# Patient Record
Sex: Female | Born: 1935 | Race: White | Hispanic: No | Marital: Married | Smoking: Never smoker
Health system: Southern US, Community
[De-identification: ages and names within clinical notes are randomized; demographics above are authoritative.]

## PROBLEM LIST (undated history)

## (undated) DIAGNOSIS — E785 Hyperlipidemia, unspecified: Secondary | ICD-10-CM

## (undated) DIAGNOSIS — M8580 Other specified disorders of bone density and structure, unspecified site: Secondary | ICD-10-CM

## (undated) DIAGNOSIS — I2510 Atherosclerotic heart disease of native coronary artery without angina pectoris: Secondary | ICD-10-CM

## (undated) DIAGNOSIS — I10 Essential (primary) hypertension: Secondary | ICD-10-CM

## (undated) DIAGNOSIS — G4700 Insomnia, unspecified: Secondary | ICD-10-CM

## (undated) DIAGNOSIS — I341 Nonrheumatic mitral (valve) prolapse: Secondary | ICD-10-CM

## (undated) DIAGNOSIS — J189 Pneumonia, unspecified organism: Secondary | ICD-10-CM

## (undated) DIAGNOSIS — N8110 Cystocele, unspecified: Secondary | ICD-10-CM

## (undated) DIAGNOSIS — M810 Age-related osteoporosis without current pathological fracture: Secondary | ICD-10-CM

## (undated) HISTORY — DX: Age-related osteoporosis without current pathological fracture: M81.0

## (undated) HISTORY — PX: VESICOVAGINAL FISTULA CLOSURE W/ TAH: SUR271

## (undated) HISTORY — DX: Other specified disorders of bone density and structure, unspecified site: M85.80

## (undated) HISTORY — PX: BLADDER SURGERY: SHX569

## (undated) HISTORY — DX: Essential (primary) hypertension: I10

## (undated) HISTORY — DX: Atherosclerotic heart disease of native coronary artery without angina pectoris: I25.10

## (undated) HISTORY — DX: Cystocele, unspecified: N81.10

## (undated) HISTORY — DX: Insomnia, unspecified: G47.00

## (undated) HISTORY — DX: Hyperlipidemia, unspecified: E78.5

## (undated) HISTORY — PX: NASAL FRACTURE SURGERY: SHX718

## (undated) HISTORY — DX: Nonrheumatic mitral (valve) prolapse: I34.1

---

## 1979-06-02 HISTORY — PX: NEPHRECTOMY: SHX65

## 1998-03-29 ENCOUNTER — Ambulatory Visit (HOSPITAL_COMMUNITY): Admission: RE | Admit: 1998-03-29 | Discharge: 1998-03-29 | Payer: Self-pay | Admitting: Urology

## 1999-03-10 ENCOUNTER — Ambulatory Visit (HOSPITAL_COMMUNITY): Admission: RE | Admit: 1999-03-10 | Discharge: 1999-03-10 | Payer: Self-pay | Admitting: Urology

## 1999-03-10 ENCOUNTER — Encounter: Payer: Self-pay | Admitting: Urology

## 2000-10-01 HISTORY — PX: CORONARY ARTERY BYPASS GRAFT: SHX141

## 2000-11-18 ENCOUNTER — Encounter: Payer: Self-pay | Admitting: Family Medicine

## 2000-11-18 ENCOUNTER — Ambulatory Visit (HOSPITAL_COMMUNITY): Admission: RE | Admit: 2000-11-18 | Discharge: 2000-11-18 | Payer: Self-pay | Admitting: Family Medicine

## 2000-11-29 ENCOUNTER — Encounter: Payer: Self-pay | Admitting: Family Medicine

## 2000-11-29 ENCOUNTER — Ambulatory Visit (HOSPITAL_COMMUNITY): Admission: RE | Admit: 2000-11-29 | Discharge: 2000-11-29 | Payer: Self-pay | Admitting: Family Medicine

## 2001-04-15 ENCOUNTER — Encounter: Payer: Self-pay | Admitting: Urology

## 2001-04-15 ENCOUNTER — Ambulatory Visit (HOSPITAL_COMMUNITY): Admission: RE | Admit: 2001-04-15 | Discharge: 2001-04-15 | Payer: Self-pay | Admitting: Urology

## 2002-01-14 ENCOUNTER — Other Ambulatory Visit: Admission: RE | Admit: 2002-01-14 | Discharge: 2002-01-14 | Payer: Self-pay | Admitting: *Deleted

## 2004-06-09 ENCOUNTER — Other Ambulatory Visit: Admission: RE | Admit: 2004-06-09 | Discharge: 2004-06-09 | Payer: Self-pay | Admitting: Obstetrics and Gynecology

## 2005-01-04 ENCOUNTER — Ambulatory Visit: Payer: Self-pay | Admitting: Cardiology

## 2005-01-12 ENCOUNTER — Emergency Department (HOSPITAL_COMMUNITY): Admission: EM | Admit: 2005-01-12 | Discharge: 2005-01-12 | Payer: Self-pay | Admitting: Emergency Medicine

## 2006-06-19 ENCOUNTER — Ambulatory Visit: Payer: Self-pay | Admitting: Cardiology

## 2006-07-04 ENCOUNTER — Ambulatory Visit: Payer: Self-pay

## 2007-06-26 ENCOUNTER — Ambulatory Visit: Payer: Self-pay | Admitting: Obstetrics and Gynecology

## 2007-09-17 ENCOUNTER — Ambulatory Visit: Payer: Self-pay | Admitting: Cardiology

## 2008-05-19 ENCOUNTER — Ambulatory Visit: Payer: Self-pay | Admitting: Cardiology

## 2008-06-10 ENCOUNTER — Ambulatory Visit: Payer: Self-pay | Admitting: Internal Medicine

## 2008-06-11 ENCOUNTER — Inpatient Hospital Stay (HOSPITAL_COMMUNITY): Admission: EM | Admit: 2008-06-11 | Discharge: 2008-06-11 | Payer: Self-pay | Admitting: Emergency Medicine

## 2008-06-11 ENCOUNTER — Encounter (INDEPENDENT_AMBULATORY_CARE_PROVIDER_SITE_OTHER): Payer: Self-pay | Admitting: Internal Medicine

## 2008-06-11 ENCOUNTER — Ambulatory Visit: Payer: Self-pay | Admitting: Vascular Surgery

## 2008-12-21 DIAGNOSIS — I059 Rheumatic mitral valve disease, unspecified: Secondary | ICD-10-CM

## 2008-12-21 DIAGNOSIS — M949 Disorder of cartilage, unspecified: Secondary | ICD-10-CM

## 2008-12-21 DIAGNOSIS — I2510 Atherosclerotic heart disease of native coronary artery without angina pectoris: Secondary | ICD-10-CM | POA: Insufficient documentation

## 2008-12-21 DIAGNOSIS — E785 Hyperlipidemia, unspecified: Secondary | ICD-10-CM | POA: Insufficient documentation

## 2008-12-21 DIAGNOSIS — I10 Essential (primary) hypertension: Secondary | ICD-10-CM | POA: Insufficient documentation

## 2008-12-21 DIAGNOSIS — M899 Disorder of bone, unspecified: Secondary | ICD-10-CM | POA: Insufficient documentation

## 2008-12-21 DIAGNOSIS — G4700 Insomnia, unspecified: Secondary | ICD-10-CM | POA: Insufficient documentation

## 2008-12-22 ENCOUNTER — Ambulatory Visit: Payer: Self-pay | Admitting: Cardiology

## 2009-06-03 ENCOUNTER — Encounter: Payer: Self-pay | Admitting: Cardiology

## 2009-06-08 ENCOUNTER — Ambulatory Visit: Payer: Self-pay | Admitting: Cardiology

## 2010-06-28 ENCOUNTER — Ambulatory Visit: Payer: Self-pay | Admitting: Cardiology

## 2010-10-31 NOTE — Assessment & Plan Note (Signed)
Summary: [facility]  Medications Added TRAMADOL HCL 50 MG TABS (TRAMADOL HCL) as needed      Allergies Added: NKDA  Visit Type:  Follow-up Primary Provider:  Dr. Christell Constant  CC:  CAD.  History of Present Illness: The patient presents for one year followup. Since I last saw her she has done very well. She remains active living at home with her husband. She has had no new cardiovascular symptoms such as chest pressure, neck or arm discomfort. She has no shortness of breath, PND or orthopnea. She has no palpitations, presyncope or syncope.  The one change from a cardiovascular standpoint has been her cholesterol. She was not at target and Dr. Christell Constant tried to increase Crestor. She didn't tolerate this and so was given some samples of Livalol.  She held off starting this until she spoke with me.  Current Medications (verified): 1)  Aspirin 81 Mg  Tabs (Aspirin) .... Daily 2)  Amlodipine Besylate 5 Mg Tabs (Amlodipine Besylate) .... Daily 3)  Temazepam 30 Mg Caps (Temazepam) .... At Bedtime 4)  Vitamin D 50,000 .... Weekly 5)  Tramadol Hcl 50 Mg Tabs (Tramadol Hcl) .... As Needed  Allergies (verified): No Known Drug Allergies  Past History:  Past Medical History:  1. Hypertension.   2. Dyslipidemia.   3. Insomnia.   4. Coronary artery disease, status post CABG in 2002.   5. Mitral valve prolapse.   6. Osteopenia.   7. Bladder prolapse .   Past Surgical History:  CABG (2002 at [facility] of the LIMA to the   LAD, SVG to obtuse marginal 1, and SVG to PDA), renal cell cancer status   post nephrectomy, hysterectomy, and repair of a nasal fracture,  bladder surgery     Review of Systems       As stated in the HPI and negative for all other systems.   Vital Signs:  Patient profile:   77 year old female Height:      64 inches Weight:      115 pounds BMI:     19.81 Pulse rate:   77 / minute Resp:     16 per minute BP sitting:   142 / 82  (right arm)  Vitals  Entered By: Marrion Coy, CNA (June 28, 2010 9:59 AM)  Physical Exam  General:  Well developed, well nourished, in no acute distress. Head:  normocephalic and atraumatic Mouth:  Teeth, gums and palate normal. Oral mucosa normal. Neck:  Neck supple, no JVD. No masses, thyromegaly or abnormal cervical nodes. Chest Wall:  Well-healed sternotomy scar Lungs:  Clear bilaterally to auscultation and percussion. Heart:  Non-displaced PMI, chest non-tender; regular rate and rhythm, S1, S2 without murmurs, rubs or gallops. Carotid upstroke normal, no bruit. Normal abdominal aortic size, no bruits. Femorals normal pulses, no bruits. Pedals normal pulses. No edema, no varicosities. Abdomen:  Bowel sounds positive; abdomen soft and non-tender without masses, organomegaly, or hernias noted. No hepatosplenomegaly. Msk:  Back normal, normal gait. Muscle strength and tone normal. Extremities:  No clubbing or cyanosis. Neurologic:  Alert and oriented x 3. Skin:  Intact without lesions or rashes. Cervical Nodes:  no significant adenopathy Axillary Nodes:  no significant adenopathy Inguinal Nodes:  no significant adenopathy Psych:  Normal affect.   EKG  Procedure date:  06/28/2010  Findings:      Sinus rhythm, rate 77, first degree AV block, premature atrial contractions, lateral T-wave inversion unchanged from previous  Impression & Recommendations:  Problem # 1:  CAD (ICD-414.00) The patient has no new symptoms. Her last stress test was in 2007. An echo was in 2009. She did have a very mildly reduced ejection fraction. I don't think further cardiovascular testing is suggested at this point. Orders: EKG w/ Interpretation (93000)  Problem # 2:  DYSLIPIDEMIA (ICD-272.4) I discussed with her the need to use the statin prescribed for her and the benefits of this. She agrees to this.  Problem # 3:  HYPERTENSION (ICD-401.9) Her blood pressure is very minimally elevated. I don't see that this has  been a problem previously. This can be followed and meds adjusted as needed.  Patient Instructions: 1)  Your physician recommends that you schedule a follow-up appointment in: 1 year with Dr Antoine Poche in [place] 2)  Your physician recommends that you continue on your current medications as directed. Please refer to the Current Medication list given to you today.

## 2011-02-13 NOTE — Assessment & Plan Note (Signed)
[facility]                            CARDIOLOGY OFFICE NOTE   Amanda Hubbard, Amanda Hubbard                      MRN:          [id number]  DATE:12/22/2008                            DOB:          August 24, 1928    PRIMARY CARE PHYSICIAN:  Ernestina Penna, MD   REASON FOR PRESENTATION:  Evaluate the patient with coronary artery  disease.  She is once again considering bladder surgery.   HISTORY OF PRESENT ILLNESS:  The patient is a pleasant 77 year old who I  saw last fall for preoperative clearance.  At that time, she was cleared  from a cardiovascular standpoint for surgery, which was the bladder  surgery.  However, she subsequently was admitted to the hospital on  June 10, 2009, with questionable TIA.  She had resolution of  symptoms.  She did have a head CT, which demonstrated no acute  abnormalities.  She had an echo, which suggested perhaps reduced  ejection fraction although with poor acoustic windows.  She did have  some midcavitary obliteration with a very vigorous basilar left  ventricular contraction.   Since being discharged from the hospital, she has been back to her usual  activities.  She is active and denies any chest pressure, neck or arm  discomfort.  She has no palpitations, presyncope, or syncope.  She  denies any PND or orthopnea.  She is able to climb a flight of stairs or  walk for exercise (greater than 5 minutes) without any of these  symptoms.  She has had no further neurologic complaints.   PAST MEDICAL HISTORY:  1. Coronary artery disease, status post CABG (2002 in [place]      with a LIMA to the LAD, SVG to obtuse marginal one and SVG to PDA).  2. Mitral valve prolapse in the past (noted on the recent echo).  3. Dyslipidemia.  4. Hypertension.  5. Osteopenia.  6. Renal cell cancer, status post nephrectomy.  7. Hysterectomy.  8. Repair of the nasal fracture.  9. Bladder prolapse.   ALLERGIES:  PENICILLIN caused  rash.   MEDICATIONS:  1. Vitamin D.  2. Temazepam 30 mg at bedtime.  3. Amlodipine 5 mg at bedtime.  4. Crestor 10 mg daily.  5. Aspirin 81 mg daily.   REVIEW OF SYSTEMS:  As stated in the HPI, and otherwise negative for all  other systems.   PHYSICAL EXAMINATION:  GENERAL:  The patient is in no distress.  VITAL SIGNS:  Blood pressure 142/82, heart rate 70 and regular, weight  119 pounds, and body mass index 18.5.  HEENT:  Eyelids unremarkable; pupils equal, round, and reactive to  light; fundi not visualized, oral mucosa unremarkable.  NECK:  No jugular venous distention at 45 degrees; carotid upstroke  brisk and symmetric; no bruits, no thyromegaly.  LYMPHATICS:  No cervical, axillary, or inguinal adenopathy.  LUNGS:  Clear to auscultation bilaterally.  BACK:  No costovertebral angle tenderness.  CHEST:  Unremarkable.  HEART:  PMI not displaced or sustained; S1 and S2 within normal limits;  no S3, no S4; no clicks, no rubs, no murmurs.  ABDOMEN:  Flat; positive  bowel sounds; normal in frequency and pitch; no bruits, no rebound, no  guarding; no midline pulsatile mass; no hepatomegaly, splenomegaly.  SKIN:  No rashes.  No nodules.  EXTREMITIES:  Pulses 2+ throughout; no edema, no cyanosis, no clubbing.  NEURO:  Oriented to person, place, and time; cranial nerves II through  XII grossly intact, motor grossly intact.   EKG, sinus rhythm, rate 70, axis within normal limits, left ventricular  hypertrophy by voltage criteria, lateral T-wave inversions unchanged  from previous EKGs.   ASSESSMENT AND PLAN:  1. Coronary artery disease.  The patient had bypass in 2002.  She had      a stress perfusion study in 2007, which demonstrated no evidence of      ischemia or infarct.  She is quite active and has high functional      level.  She has no symptoms.  Given this, according to ACC/AHA      guidelines, the patient is at acceptable risk for surgery should      she need this.  No  further cardiovascular testing is suggested.      She should have continued risk reduction.  2. Hypertension.  Her blood pressure is controlled and she will      continue the meds as listed.  3. Dyslipidemia.  This was followed by Dr. Christell Constant.  I did review this.      Her LDL was 248.  Her HDL was 51.  She was not taking her      cholesterol therapy.  She is now on Crestor.  He will follow this      up in the weeks to come.  She understands the importance of this.  4. Abnormal electrocardiogram.  The patient has an abnormal EKG.      According to the most recent echo, she may have some apical      hypertrophy, which would explain this EKG pattern.  However, this      is not causing any symptoms.  Because of her some suggestion of a      slightly low ejection fraction, I may follow this up in the future      with a repeat echocardiogram.  Certainly, if she gets any shortness      of breath, I do want to see her sooner and evaluate this.  5. Followup.  I have her scheduled for one year followup, but I would      be happy to see her sooner.     Rollene Rotunda, MD, [facility]  Electronically Signed    JH/MedQ  DD: 12/22/2008  DT: 12/23/2008  Job #: [id number]   cc:   Ernestina Penna, M.D.  Randye Lobo, M.D.

## 2011-02-13 NOTE — Group Therapy Note (Signed)
NAMEKATHRYNE, RAMELLA               ACCOUNT NO.:  [id number]   MEDICAL RECORD NO.:  [id number]          PATIENT TYPE:  WOC   LOCATION:  [facility]                   FACILITY:  [facility]   PHYSICIAN:  Argentina Donovan, MD        DATE OF BIRTH:  14-Jun-1928   DATE OF SERVICE:                                  CLINIC NOTE   HISTORY OF PRESENT ILLNESS:  The patient is a 77 year old Caucasian  female gravida 3, para 3-0-0-3 who has had a past history of a  nephrectomy for renal cancer, quadruple cardiac bypass, who has been  doing very well since both of these and had a total hysterectomy many  years ago. She comes in because of vulva irritation.  She has been using  Premarin cream about three times a week for many years. About 3 weeks  ago she thought she had a yeast infection because of itching that came  on. She used an over-the-counter antifungal the agent for 3 days as  directed on the box and she thought that she got pretty good relief,  although she still has some occasional swelling of the labia at night  and also irritation.  I told her that the swelling could come from  standing on her feet so long. She has some varicosities perhaps in the  vulva and also an allergic reaction might cause if she becomes  hypersensitive to the Premarin.  I have suggested that she not use the  Premarin for least a month to see whether that was part of the problem.   PHYSICAL EXAMINATION:  PELVIC:  On examination the external genitalia is  normal.  BOS within normal limits.  The vagina is clean, somewhat loss  of rogation, but not really very dry, probably secondary to the Premarin  cream.  __________ status is hysterectomy at the apex of vagina and the  adnexa could not be palpated.  ABDOMEN  Abdomen is soft, flat, nontender.  No masses nor organomegaly.  Wet prep was taken, although there is not much in the way of vaginal  discharge.  I have told her we will let her know if she has a yeast  infection or  anything else shows up on the wet prep and needs to be  treated.  She says that she had a lot of vaginal dryness, I have  suggested that because of the itching that she stop using the Premarin  cream for well and switch to one of the personal lubricants like KY or  Astroglide.   IMPRESSION:  Vulvar itching.  No sign of vulvitis.           ______________________________  Argentina Donovan, MD     PR/MEDQ  D:  06/26/2007  T:  06/27/2007  Job:  [id number]

## 2011-02-13 NOTE — Assessment & Plan Note (Signed)
[facility]                            CARDIOLOGY OFFICE NOTE   Amanda Hubbard, Amanda Hubbard                      MRN:          [id number]  DATE:05/19/2008                            DOB:          01-22-1928    PRIMARY CARE PHYSICIAN:  Ernestina Penna, MD.   REFERRING PHYSICIAN:  Randye Lobo, MD.   REASON FOR CONSULTATION:  Preoperative evaluation of patient with  coronary artery disease.   HISTORY OF PRESENT ILLNESS:  The patient is a 77 year old white female  with prior history of coronary artery disease as described below.  I  last saw her in December and was planning on seeing her again in 18  months.  However, she presents now with bladder prolapse and wants to  have this surgically fixed.   Since I last saw her, she has had no new cardiovascular complaints.  She  remains active.  She climbs stairs and vacuum (greater than 5 METS).  With this level of activity, she denies any chest pressure, neck, or arm  discomfort.  She has had no palpitations, presyncope, or syncope.  She  denies any PND or orthopnea.  Her last stress perfusion study was in  October 2007.  At that time, she had no evidence of ischemia or scar and  EF was 74%.  She has had no new symptoms since that time.   PAST MEDICAL HISTORY:  Coronary artery disease status post CABG, mitral  valve prolapse, dyslipidemia, hypertension, and osteopenia.   PAST SURGICAL HISTORY:  CABG (2002 at [facility] of the LIMA to the  LAD, SVG to obtuse marginal 1, and SVG to PDA), renal cell cancer status  post nephrectomy, hysterectomy, and repair of a nasal fracture.   ALLERGIES/INTOLERANCE:  PENICILLIN causes rash.   MEDICATIONS:  1. Temazepam 30 mg daily.  2. Norvasc 5 mg daily.  3. Calcium.  4. Multivitamin.  5. Pravastatin 40 mg daily.   SOCIAL HISTORY:  The patient is retired Engineer, agricultural.  She has 3 sons.  She never smoked cigarettes and does not drink alcohol.   FAMILY HISTORY:   Noncontributory for early coronary artery disease.   REVIEW OF SYSTEMS:  As stated in the HPI and positive for occasional  constipation.  Negative for all other systems.   PHYSICAL EXAMINATION:  GENERAL:  The patient is in no distress.  VITAL SIGNS:  Blood pressure is 146/82, heart rate 77 and regular,  weight 112 pounds, and body mass index 18.5.  HEENT:  Eyelids are unremarkable.  Pupils are equal, round, and reactive  to light, fundi not visualized, oral mucosa unremarkable.  NECK:  No jugular venous distention at 45 degrees, carotid upstroke  brisk and symmetrical.  No bruits.  No thyromegaly.  LYMPHATICS:  No  cervical, axillary, or inguinal adenopathy.  LUNGS:  Clear to auscultation bilaterally.  BACK:  No costovertebral tenderness.  CHEST:  Unremarkable.  Well-healed sternotomy scar.  HEART:  PMI not displaced or sustained, S1 and S2 within normal limits.  No S3, no S4, no clicks, no rubs, and no murmurs.  ABDOMEN:  Flat,  positive bowel sounds.  Normal in frequency and pitch.  No bruits,  midline, or pulsatile mass.  No hepatomegaly or splenomegaly.  SKIN:  No rashes, no nodules.  EXTREMITIES:  Pulse 2+ throughout, right greater than left femoral  bruit, no cyanosis, no clubbing, no edema.  NEURO:  Oriented to person, place, and time.  Cranial nerves II-XII  grossly intact, motor grossly intact.   EKG; sinus rhythm, rate 77, left ventricle hypertrophy by voltage  criteria, repolarization changes in anterior and lateral leads with T-  wave inversions.  This EKG is unchanged from previous.   ASSESSMENT AND PLAN:  1. Coronary artery disease.  The patient has no new symptoms.  She had      a stress perfusion study in 2007.  She has a high functional level.      She is going for a moderate-risk procedure at best.  From this and      according to the ACC/AHA guidelines, the patient has had acceptable      risk for the planned procedure.  No further cardiovascular testing      is  suggested.  She should continue on the medicines as listed.  2. Hypertension.  Her blood pressure is controlled and she will      continue on the medicines as listed.  3. Dyslipidemia per Dr. Christell Constant.  Goals in LDL less than 100 and HDL      greater than 40.  4. Mitral valve prolapse.  I do not hear this.  It has not been      associated with any symptoms or significant regurgitation.  This      should be followed clinically.  5. Followup.  I will see her back in about 18 months.  To be happy to      be involved should you have any questions at the time of her      surgery.     Rollene Rotunda, MD, [facility]  Electronically Signed    JH/MedQ  DD: 05/19/2008  DT: 05/20/2008  Job #: [id number]   cc:   Randye Lobo, M.D.  Ernestina Penna, M.D.

## 2011-02-13 NOTE — Assessment & Plan Note (Signed)
[facility]                            CARDIOLOGY OFFICE NOTE   Amanda Hubbard, Amanda Hubbard                      MRN:          [id number]  DATE:09/17/2007                            DOB:          [birth year]    PRIMARY CARE PHYSICIAN:  Ernestina Penna, M.D.   REASON FOR PRESENTATION:  Evaluate patient with coronary disease.   HISTORY OF PRESENT ILLNESS:  The patient returns for follow up.  It has  been a little less than 18 months.  Since I last saw her, she has not  been getting any chest discomfort, neck or arm discomfort.  She has not  been getting any palpitations, no presyncope or syncope.  She has had no  PND or orthopnea.  She has been walking routinely.   At the last visit, I did give her propranolol for management of her  hypertension.  However, she said she did not tolerate this for vague  reasons.   PAST MEDICAL HISTORY:  1. Coronary artery disease (CABG in December of 2002 in [place]      with a LIMA to the LAD, SVG to obtuse marginal 1 and SVG to PDA).  2. Mitral valve prolapse.  3. Palpitations.  4. Dyslipidemia.  5. Hypertension.  6. Osteopenia.  7. Renal cancer status post nephrectomy.  8. Hysterectomy.   ALLERGIES:  PENICILLIN caused a rash.   MEDICATIONS:  1. Temazepam 30 mg daily.  2. Norvasc 500 mg daily.  3. Calcium.  4. Multivitamin.   REVIEW OF SYSTEMS:  As stated in the HPI and otherwise negative for  other systems.   PHYSICAL EXAMINATION:  GENERAL:  The patient is in no distress.  VITAL SIGNS:  Blood pressure 144/80, heart rate 74 and regular, weight  115 pounds, body mass index 18.5.  HEENT:  Eyes unremarkable.  Pupils equal, round and reactive to light.  Fundi not visualized.  Oral mucosa unremarkable.  NECK:  No jugular venous distention at 45 degrees.  Carotid upstrokes  brisk and symmetric.  No bruits or thyromegaly.  LYMPHATICS:  No cervical, axillary or inguinal adenopathy.  LUNGS:  Clear to  auscultation bilaterally.  BACK:  No costovertebral angle tenderness.  CHEST:  Unremarkable.  HEART:  PMI not displaced or sustained.  S1 and S2 within normal limits.  No S3, no S4, no clicks, rubs or murmurs.  ABDOMEN:  Flat.  Positive bowel sounds.  Normal in frequency and pitch.  No bruits, rebound or guarding.  No midline pulse.  No masses, no  hepatomegaly or splenomegaly.  SKIN:  No rashes or nodules.  EXTREMITIES:  There were 2+ pulses.  No edema, cyanosis or clubbing.  NEUROLOGIC:  Oriented to person, place, and time.  Cranial nerves II-XII  grossly intact.  Motor grossly intact.   ELECTROCARDIOGRAM:  Sinus rhythm.  Rate 74, axis within normal limits,  left ventricular hypertrophy, minimal voltage criteria, repolarization  changes, unchanged from previously.   ASSESSMENT AND PLAN:  1. Coronary disease.  The patient is having no new symptoms.  She has      a baseline abnormal EKG.  She did have a stress perfusion study      last year which demonstrated no evidence of scar or ischemia.  She      remains active.  Given this, no further cardiovascular testing is      suggested.  She will continue with risk reduction.  2. Her blood pressure is elevated again.  I noted in Dr. Kathi Der      office it was in the 170s systolic.  She did not tolerate the      propranolol.  I have suggested adding a low-dose of diuretic, but      she does not want to take this.  Therefore, she will agree to take      7.5 mg of Norvasc.  She can have her blood pressure followed by Dr.      Christell Constant going forward.  The goal would be a blood pressure of less      than 140/90.  3. Follow up - I can see her back in about 18 months or sooner if      needed.     Rollene Rotunda, MD, [facility]  Electronically Signed    JH/MedQ  DD: 09/17/2007  DT: 09/18/2007  Job #: [id number]   cc:   Ernestina Penna, M.D.

## 2011-02-13 NOTE — H&P (Signed)
NAMESHAROLYN, Hubbard               ACCOUNT NO.:  [id number]   MEDICAL RECORD NO.:  [id number]          PATIENT TYPE:  EMS   LOCATION:  [facility]                         FACILITY:  [facility]   PHYSICIAN:  Ladell Pier, M.D.   DATE OF BIRTH:  [birth year]   DATE OF ADMISSION:  06/10/2008  DATE OF DISCHARGE:                              HISTORY & PHYSICAL   CHIEF COMPLAINT:  Slurred speech and swallowing difficulties this a.m.   HISTORY OF PRESENT ILLNESS:  The patient is a 77 year old white female  that was brought to the emergency room by her son secondary to problems  with swallowing this morning.  She also had a little bit of slurred  speech.  The patient stated that early this morning she developed those  symptoms.  Her son took her to see her primary care doctor, he referred  her next door to see a neurologist in [place].  She saw the  neurologist and was referred to the ER for stroke workup.  She states  that she feels fine.  She does not think she had a stroke.  She does not  have any chest pain.  No shortness of breath.  She does not have any  weakness.  She thinks her speech is fine now.  She walks okay without a  cane.   PAST MEDICAL HISTORY:  1. Significant for hypertension.  2. Dyslipidemia.  3. Insomnia.  4. Coronary artery disease, status post CABG in 2002.  5. Mitral valve prolapse.  6. Osteopenia.  7. History of bladder prolapse for which she had a preop evaluation      done by [facility] in August 2009.  8. History of renal cell cancer, status post nephrectomy.  9. Total abdominal hysterectomy.  10.History of nasal fracture repair.   FAMILY HISTORY:  She said that her mother had a heart attack at 57 years  old.   SOCIAL HISTORY:  She is retired Engineer, agricultural.  She has two sons, one  daughter.  She has no tobacco or alcohol history.   MEDICATIONS:  1. Crestor 30 mg at bedtime.  2. Norvasc 5 mg daily.  3. Calcium 500 mg daily.  4. Multivitamin  daily.  5. Pravastatin 40 mg at bedtime.   ALLERGIES:  PENICILLIN causes a rash.   REVIEW OF SYSTEMS:  Negative; otherwise stated in the HPI.   PHYSICAL EXAMINATION:  VITAL SIGNS:  Temperature 98, blood pressure  164/83, pulse 76, respirations 18, pulse ox 96% on room air.  HEENT:  Head is normocephalic, atraumatic.  Pupils equal, round,  reactive to light.  Throat without erythema.  CARDIOVASCULAR:  Regular  rate and rhythm.  LUNGS:  Clear bilaterally.  ABDOMEN:  Soft, nontender, nondistended.  Positive bowel sounds.  EXTREMITIES:  Without edema.  NEUROLOGIC:  Nonfocal.  She has no facial asymmetry.  Speech is not  slurred.  Strength is 5/5 in upper and lower extremities.  Finger-to-  nose intact.  Peripheral vision is fine.   LABORATORY DATA:  Head CT is without any acute findings.  WBC 10.1,  hemoglobin 14.6, platelet 219,000, MCV  90.5.  Sodium 140, potassium 4.1,  chloride 109, BUN 19, creatinine 1.4, glucose 104.  UA negative.  First  set of cardiac enzymes negative.  Chest x-ray shows no evidence of acute  cardiopulmonary disease.   ASSESSMENT:  1. Question of transient ischemic attack.  2. Hypertension.  3. Dyslipidemia.  4. Insomnia.  5. Coronary artery disease.  6. Osteopenia.   PLAN:  Will admit the patient to the hospital.  Will do TIA workup with  carotid Dopplers, MRI, MRA, EKG.  Will check PT/INR, fasting lipid  panel, cardiac enzymes and will start her on aspirin 325 mg daily.  Will  continue her on her Norvasc for hypertension and Restoril p.r.n. for  sleep      Ladell Pier, M.D.  Electronically Signed     NJ/MEDQ  D:  06/10/2008  T:  06/11/2008  Job:  [id number]

## 2011-02-13 NOTE — Discharge Summary (Signed)
Amanda Hubbard, Amanda Hubbard               ACCOUNT NO.:  [id number]   MEDICAL RECORD NO.:  [id number]          PATIENT TYPE:  INP   LOCATION:  5511                         FACILITY:  [facility]   PHYSICIAN:  Renee Ramus, MD       DATE OF BIRTH:  30-Jun-1928   DATE OF ADMISSION:  06/10/2008  DATE OF DISCHARGE:  06/11/2008                               DISCHARGE SUMMARY   PRIMARY DIAGNOSIS:  Transient ischemic attack.   SECONDARY DIAGNOSES:  1. Hyperlipidemia.  2. Coronary artery disease.  3. Osteopenia.  4. Status post nephrectomy for renal cell cancer.   HOSPITAL COURSE BY PROBLEMS:  1. TIA.  The patient is a 77 year old female, who was admitted      secondary to dysphagia and slurred speech.  The patient was seen in      the emergency department, admitted to our service.  The patient did      have a head CT showed no acute disease.  The patient did have      carotid Doppler showing no significant stenosis.  The patient will      be discharged on aspirin.  She is currently taking pravastatin.      She did have a cholesterol panel done and showed that her      cholesterol is not in good control, but she will follow up with a      cardiologist for this.  The patient is currently has no signs or      symptoms consistent with neurological deficits and she is not a      rehab candidate.  2. Hyperlipidemia as above.  The patient's cholesterol is not well      controlled.  Her LDL was 223, her total cholesterol was 285.  She      says she takes her statin not as much as I should and she was      counseled in respect to the importance of this medication.  3. Hypertension.  The patient is on Norvasc.  Currently, her blood      pressure is 133/85.  I have requested that she follow up with her      primary care physician with regards to the possibility of adjusting      his medication.  4. Coronary artery disease, currently stable.  The patient has had no      evidence of ischemia or infarction.  5.  Acute renal failure.  The patient did have elevated creatinine and      we have no baseline numbers to compare to.  I am not sure if this      represents a prerenal dehydration versus chronic kidney disease,      but she is status post nephrectomy and this may well represent her      baseline status.  6. Osteopenia.  The patient will continue with calcium and vitamin D      supplements.   LABORATORY DATA:  1. Slightly concentrated hemoglobin with hemoglobin of 14.6,      hematocrit of 43.  2. Elevated BUN 19 and creatinine 1.4.  3. Normal CK, CK-MB, and troponin with troponin of 0.01.  4. Cholesterol total of 285 with triglycerides of 120, LDL 223, and      HDL of 38.  5. UA showing no evidence of infection.   STUDIES:  1. CT head without contrast showing no acute disease.  2. A chest film showing no acute cardiopulmonary disease.  3. Echocardiogram, which is currently pending.   DISCHARGE MEDICATIONS:  1. Temazepam 30 mg p.o. nightly.  2. Norvasc 5 mg p.o. daily.  3. Calcium plus D 500 mg p.o. daily.  4. Multivitamin p.o. daily.  5. Pravastatin 40 mg p.o. nightly.  6. Aspirin 81 mg p.o. daily.   Echocardiogram is currently pending, otherwise there are no labs or  studies pending at time of discharge.  The patient is in stable  condition and anxious for discharge.   TIME SPENT:  35 minutes.      Renee Ramus, MD  Electronically Signed     JF/MEDQ  D:  06/11/2008  T:  06/12/2008  Job:  [id number]

## 2011-02-16 NOTE — Assessment & Plan Note (Signed)
[facility]                              CARDIOLOGY OFFICE NOTE   Amanda Hubbard, Amanda Hubbard                      MRN:          [id number]  DATE:06/19/2006                            DOB:          1927-11-22    PRIMARY CARE PHYSICIAN:  Dr. Vernon Prey.   REASON FOR PRESENTATION:  Patient with coronary disease.   HISTORY OF PRESENT ILLNESS:  Patient returns after about 18 months.  We saw  her last year in followup of her coronary disease.  She was new to me. She  is now 77 years old.  She had no new symptoms since I last saw her.  She  does occasionally feel her heart beating hard.  She does not describe a  tachy arrhythmia. There has been no presyncope or syncope.  She tries to  remain active doing some light housekeeping.  With this she denies any chest  discomfort, neck discomfort, arm discomfort, activity induced nausea or  vomiting, excessive diaphoresis.  She does not have any excessive shortness  of breath and denies any PND or orthopnea.   Of note the patient never had any symptoms prior to her diagnosis of  coronary disease.  She instead had an abnormal EKG and followup after that.   PAST MEDICAL HISTORY:  Coronary artery disease (CABG December 2002  in  [place] with a  LIMA to the LAD, SVG to obtuse marginal 1, and SVG to  PDA), mitral valve prolapse, palpitation, hyperlipidemia, hypertension,  osteopenia, renal cancer  status post nephrectomy, hysterectomy.   ALLERGIES:  PENICILLIN caused rash.   MEDICATIONS:  1. Temazepam.  2. Norvasc 5 mg daily.  3. Calcium.  4. Multivitamin.   REVIEW OF SYSTEMS:  As stated in the HPI.   PHYSICAL EXAMINATION:  The patient is pleasant and  in no distress.  Blood pressure 148/80.  Heart rate is 70 and regular.  Weight 110 pounds.  body mass index 18.5.  HEENT:  Eyes unremarkable, pupils equal, round and reactive to light.  Fundi  not visualized.  Oral mucosa unremarkable.  NECK:  No jugular  venous distention.  Wave form within normal limits.  Carotid upstroke brisk and symmetric.  No bruits, no thyromegaly.  LYMPHATICS:  No cervical, axillary, inguinal adenopathy.  LUNGS:  Clear to auscultation bilaterally.  BACK:  No costovertebral angle  tenderness.  CHEST:  Unremarkable.  HEART:  PMI not displaced or sustained, S1 and S2 within normal limits.  No  S3, no S4, no murmurs.  ABDOMEN:  Flat, positive bowel sounds.  Normal in frequency and pitch, no  bruits, no rebound, no guarding, no midline  pulsatile mass,  no  hepatomegaly, no splenomegaly.  SKIN:  No rashes.  __________ .  EXTREMITIES:  2+ pulse throughout, no edema, no cyanosis, no clubbing.  NEUROLOGICAL:  Oriented to person, place and time, cranial nerve II through  XII grossly intact, motor grossly intact.   An EKG sinus rhythm, rate 70, axis within normal limits, left ventricular  hypertrophy by voltage criteria, lateral inferior T-wave inversions  consistent with repolarization  changes and unchanged  from previous EKGs.   ASSESSMENT/PLAN:  1. Coronary artery disease.  The patient has coronary disease and never      had symptoms.  Her bypass grafts are  now 75 years old.  Given this,      screening with a stress perfusion study is indicated.  The patient will      have an Adenosine Cardiolite.  Further evaluation based on these      results.  2. Risk reduction.  The patient has not tolerated multiple Statins.  She      has had some vague symptoms.  She does agree to try Pravastatin and I      will start at 20 mg a day.  3. Hypertension.  Blood pressure is elevated.  She is also describing some      strong heartbeats.  She would benefit statistically from beta blocker.      She has tolerated Inderal and so I will start this with a low dose of      10 mg twice a day.  4. Followup will be based on the results of the stress perfusion study.  I      would like to see her at least in a year or sooner.  She will have  her      lipids and blood pressure followed by Dr. Christell Constant.                               Rollene Rotunda, MD, [facility]    JH/MedQ  DD:  06/19/2006  DT:  06/20/2006  Job #:  [id number]   cc:   Ernestina Penna, M.D.

## 2011-05-08 ENCOUNTER — Encounter: Payer: Self-pay | Admitting: Cardiology

## 2011-05-10 ENCOUNTER — Encounter: Payer: Self-pay | Admitting: Cardiology

## 2011-05-11 ENCOUNTER — Encounter: Payer: Self-pay | Admitting: Cardiology

## 2011-07-04 LAB — COMPREHENSIVE METABOLIC PANEL
ALT: 11
AST: 14
CO2: 24
Calcium: 9.2
GFR calc Af Amer: 48 — ABNORMAL LOW
Sodium: 138
Total Protein: 7.1

## 2011-07-04 LAB — POCT I-STAT, CHEM 8
Creatinine, Ser: 1.4 — ABNORMAL HIGH
Glucose, Bld: 104 — ABNORMAL HIGH
Hemoglobin: 14.6
TCO2: 26

## 2011-07-04 LAB — URINALYSIS, ROUTINE W REFLEX MICROSCOPIC
Bilirubin Urine: NEGATIVE
Hgb urine dipstick: NEGATIVE
Protein, ur: NEGATIVE
Urobilinogen, UA: 0.2

## 2011-07-04 LAB — HOMOCYSTEINE: Homocysteine: 15.1

## 2011-07-04 LAB — LIPID PANEL
Total CHOL/HDL Ratio: 7.5
Triglycerides: 120
VLDL: 24

## 2011-07-04 LAB — CBC
MCHC: 33.8
RBC: 4.77
RDW: 12.9

## 2011-07-04 LAB — CK TOTAL AND CKMB (NOT AT ARMC)
CK, MB: 2.5
Total CK: 59

## 2011-07-04 LAB — PROTIME-INR: INR: 1

## 2011-07-04 LAB — DIFFERENTIAL
Eosinophils Absolute: 0.3
Eosinophils Relative: 3
Lymphs Abs: 2
Monocytes Relative: 5

## 2011-07-04 LAB — POCT CARDIAC MARKERS: CKMB, poc: 3.9

## 2011-07-04 LAB — APTT: aPTT: 33

## 2012-08-14 ENCOUNTER — Other Ambulatory Visit: Payer: Self-pay | Admitting: Family Medicine

## 2012-08-14 DIAGNOSIS — R109 Unspecified abdominal pain: Secondary | ICD-10-CM

## 2012-08-19 ENCOUNTER — Other Ambulatory Visit (HOSPITAL_COMMUNITY): Payer: Self-pay

## 2012-08-20 ENCOUNTER — Ambulatory Visit (HOSPITAL_COMMUNITY)
Admission: RE | Admit: 2012-08-20 | Discharge: 2012-08-20 | Disposition: A | Discharge status: Home / Self Care | Payer: Medicare Other | Source: Ambulatory Visit | Attending: Family Medicine | Admitting: Family Medicine

## 2012-08-20 DIAGNOSIS — R109 Unspecified abdominal pain: Secondary | ICD-10-CM | POA: Insufficient documentation

## 2012-08-20 DIAGNOSIS — K869 Disease of pancreas, unspecified: Secondary | ICD-10-CM | POA: Insufficient documentation

## 2012-08-20 DIAGNOSIS — K5900 Constipation, unspecified: Secondary | ICD-10-CM | POA: Insufficient documentation

## 2012-08-20 DIAGNOSIS — K8020 Calculus of gallbladder without cholecystitis without obstruction: Secondary | ICD-10-CM | POA: Insufficient documentation

## 2012-08-20 MED ORDER — IOHEXOL 300 MG/ML  SOLN
100.0000 mL | Freq: Once | INTRAMUSCULAR | Status: AC | PRN
  Administered 2012-08-20: 100 mL via INTRAVENOUS

## 2012-09-09 ENCOUNTER — Encounter: Payer: Self-pay | Admitting: Internal Medicine

## 2012-09-29 ENCOUNTER — Encounter: Payer: Self-pay | Admitting: Internal Medicine

## 2012-10-03 ENCOUNTER — Ambulatory Visit (INDEPENDENT_AMBULATORY_CARE_PROVIDER_SITE_OTHER): Payer: Medicare Other | Admitting: Internal Medicine

## 2012-10-03 ENCOUNTER — Encounter: Payer: Self-pay | Admitting: Internal Medicine

## 2012-10-03 ENCOUNTER — Other Ambulatory Visit (INDEPENDENT_AMBULATORY_CARE_PROVIDER_SITE_OTHER): Payer: Medicare Other

## 2012-10-03 VITALS — BP 120/80 | HR 107 | Ht 62.0 in | Wt 97.6 lb

## 2012-10-03 DIAGNOSIS — K862 Cyst of pancreas: Secondary | ICD-10-CM

## 2012-10-03 LAB — CBC
HCT: 41.4 % (ref 36.0–46.0)
Hemoglobin: 13.8 g/dL (ref 12.0–15.0)
MCHC: 33.3 g/dL (ref 30.0–36.0)
MCV: 93 fl (ref 78.0–100.0)
Platelets: 231 10*3/uL (ref 150.0–400.0)
RBC: 4.46 Mil/uL (ref 3.87–5.11)
RDW: 13.8 % (ref 11.5–14.6)
WBC: 12.3 10*3/uL — ABNORMAL HIGH (ref 4.5–10.5)

## 2012-10-03 LAB — COMPREHENSIVE METABOLIC PANEL
AST: 16 U/L (ref 0–37)
BUN: 15 mg/dL (ref 6–23)
Calcium: 9.5 mg/dL (ref 8.4–10.5)
Chloride: 104 mEq/L (ref 96–112)
Creatinine, Ser: 1.3 mg/dL — ABNORMAL HIGH (ref 0.4–1.2)
Total Bilirubin: 1.5 mg/dL — ABNORMAL HIGH (ref 0.3–1.2)

## 2012-10-03 NOTE — Patient Instructions (Addendum)
Your physician has requested that you go to the basement for the following lab work before leaving today:CMP, CBC  Repeat labs with Dr. Christell Constant in 3 months   Follow up with Dr, Rhea Belton in 6 months and we will repeat labs again

## 2012-10-03 NOTE — Progress Notes (Signed)
Patient ID: Amanda Hubbard, female   DOB: December 14, 1927, 79 y.o.   MRN: [id number]  SUBJECTIVE: HPI Amanda Hubbard is an 79 year old female with a past medical history of hypertension, dyslipidemia, CAD, mitral valve prolapse, osteoporosis who seen in consultation at the request of Dr. Christell Constant for evaluation of a cystic pancreas lesion. She is accompanied today by her son. The patient has known about her pancreas lesion since around 2008 and it has been imaged several times since being discovered.  Other than knowing about this lesion, she has very little knowledge of what this might mean. She reports intermittent issues with constipation a long-standing IBS. She denies upper abdominal pain. She's had no nausea or vomiting. She does report a gradual weight loss of about 10-15 pounds over the last 2 years. She reports her appetite is okay, but on the whole likely less than in years past.  She has never had jaundice, dark urine or diffuse itching.  She denies a history of hepatitis or pancreatitis. She denies diarrhea, blood in her stool or melena. No recent fevers or chills.  She does state during the encounter that she has had friends with pancreas cancer and she feels that this is a "bad" cancer.  She also states that she would not want any form of cancer treatment including chemotherapy, radiation or surgery.  Review of Systems  As per history of present illness, otherwise negative   Past Medical History  Diagnosis Date  . Hypertension   . Dyslipidemia   . Insomnia   . CAD (coronary artery disease)     s/p CABG in 2002  . Mitral valve prolapse   . Osteopenia   . Bladder prolapse, congenital   . Osteoporosis     Current Outpatient Prescriptions  Medication Sig Dispense Refill  . amLODipine (NORVASC) 5 MG tablet Take 5 mg by mouth daily.        Marland Kitchen aspirin 81 MG tablet Take 81 mg by mouth daily.        . ergocalciferol (VITAMIN D2) 50000 UNITS capsule Take 50,000 Units by mouth once a week.         . temazepam (RESTORIL) 30 MG capsule Take 30 mg by mouth at bedtime as needed.        . TraMADol HCl 50 MG TBDP Take 1 tablet by mouth as needed.        . zolpidem (AMBIEN) 10 MG tablet Take 10 mg by mouth at bedtime as needed.        Allergies  Allergen Reactions  . Penicillins     Family History  Problem Relation Age of Onset  . Cancer Maternal Grandmother     ?    History  Substance Use Topics  . Smoking status: Never Smoker   . Smokeless tobacco: Never Used  . Alcohol Use: No    OBJECTIVE: BP 120/80  Pulse 107  Ht 5\' 2"  (1.575 m)  Wt 97 lb 9.6 oz (44.271 kg)  BMI 17.85 kg/m2 Constitutional: Thin elderly appearing female in no acute distress HEENT: Normocephalic and atraumatic. Oropharynx is clear and moist. No oropharyngeal exudate. Conjunctivae are normal. No scleral icterus. Neck: Neck supple. Trachea midline. Cardiovascular: Tachycardic but regular Pulmonary/chest: Distant breath sounds without wheezing rales or rhonchi Abdominal: Soft, thin, nontender, nondistended. Bowel sounds active throughout. There are no masses palpable. . Extremities: no clubbing, cyanosis, or edema Lymphadenopathy: No cervical adenopathy noted. Neurological: Alert and oriented to person place and time. Skin: Skin is warm and dry.  No rashes noted. Psychiatric: Normal mood and affect. Behavior is normal.  Labs and Imaging -- Labs dated 08/06/2012 -- TSH 1.068 Total bili 1.7, indirect bili 1.4, AST 15, ALT 9, albumin 4.5, alkaline phosphatase 59 Sodium 140, potassium 4.1, chloride 104, CO2 22, glucose 91, BUN 17, creatinine 1.08, calcium 9.7 Vitamin D. 12 WBC 12.4, hemoglobin 14.5, hematocrit 43.1, platelet 278  **ADDENDUM** CREATED: 08/26/2012 13:18:45   The patient had a prior CT in 2007 at [facility] under a different patient ID.  These PACS files have now been merged together, and the previous noncontrast abdomen CT from 05/23/2006 is now available for comparison with the  current exam.   Comparison shows that the complex cystic mass in the pancreatic head has mildly increased in size since previous study, currently measuring 5.4 x 4.6 cm compared to 4.0 x 3.1 cm on previous exam. This lesion also shows more complex features, although this could be due to the fact that the previous study did not utilize IV contrast.  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  Endoscopic ultrasound and cyst aspiration should be considered based on current consensus guidelines, as cited in the original report below.   These results will be called to the ordering clinician or representative by the Radiologist Assistant, and communication documented in the PACS Dashboard.   **END ADDENDUM** SIGNED BY: John A. Eppie Gibson, M.D.     Study Result     *RADIOLOGY REPORT*   Clinical Data: Abdominal pain. Constipation.  Personal history of renal cell carcinoma.   CT ABDOMEN AND PELVIS WITH CONTRAST   Technique:  Multidetector CT imaging of the abdomen and pelvis was performed following the standard protocol during bolus administration of intravenous contrast.   Contrast: OMNIPAQUE IOHEXOL 300 MG/ML  SOLN   Comparison: None.   Findings: Numerous tiny gallstones are seen.  Gallbladder is distended, however there is no evidence of gallbladder wall thickening or pericholecystic inflammatory changes.   Diffuse biliary ductal dilatation is seen.  A complex cystic mass is seen in the porta hepatis arising from the pancreatic head which measures 4.6 x 5.4 cm.  This mass both thin and thickened internal septations, with multiple individual cystic foci measuring up to 3 cm.  Differential diagnosis includes serous and mucinous cystic pancreatic neoplasms. Mild pancreatic ductal dilatation is seen in the head and neck, but not the body or tail.   Prior hysterectomy noted.  No  soft tissue masses are seen in the nephrectomy bed.  A small left renal cyst is seen however there is no evidence of left renal mass or hydronephrosis.  No evidence of retroperitoneal or other abdominal lymphadenopathy.   No pelvic soft tissue masses or lymphadenopathy identified. Previous hysterectomy noted.  Adnexal regions are unremarkable.  No evidence of inflammatory process, abscess, or ascites.   IMPRESSION:   1.  5.4 cm complex cystic mass arising from the pancreatic head. Differential diagnosis includes both serous and mucinous cystic pancreatic neoplasms. Given its size, endoscopic ultrasound and cyst aspiration should be considered.  This recommendation follows ACR consensus guidelines:  Managing Incidental Findings on Abdominal CT:  White Paper of the ACR Incidental Findings Committee.  J Am Coll Radiol 2010;7:754-773 2.  Cholelithiasis and diffuse biliary ductal dilatation.  No definite signs of acute cholecystitis. 3.  No evidence of metastatic disease.      ASSESSMENT AND PLAN: 79 year old female with a past medical history of hypertension,  dyslipidemia, CAD, mitral valve prolapse, osteoporosis who seen in consultation at the request of Dr. Christell Constant for evaluation of a cystic pancreas lesion.  1.  Head of pancreas cystic lesion -- I spent a great deal of time today discussing the patient's head of pancreas cystic lesion including the possibility this could represent a neoplastic/malignant process, including IPMN.  We also discussed the normal workup which would include endoscopic ultrasound with aspiration of the cystic fluid.  She states very clearly that she does not desire to know whether or not she has cancer, because she feels that this would cause her a great deal of stress and decrease her quality of life. She also states that she would not be inclined to have anything done about this lesion should be cancer, specifically in the absence of symptoms. She understands that  this lesion will likely continue to grow in size, may cause symptoms such as abdominal pain, biliary obstruction pancreatitis, etc., and with this knowledge she does not wish for further workup at this time. This has been discussed in the presence of her son, who understands and agrees with this plan.  I have made him aware that should they change their mind, endoscopic ultrasound could be pursued at a later time. In the interim I would like to check her liver function tests until they should be checked every 3 months to insure no evidence of biliary obstruction, given this lesion's location.  I've also asked that she notify us immediately should she develop jaundice, abdominal pain, nausea or vomiting. She voices understanding. I will see her back in 6 month's time or sooner if necessary

## 2012-12-30 ENCOUNTER — Other Ambulatory Visit: Payer: Self-pay | Admitting: *Deleted

## 2012-12-30 DIAGNOSIS — G4700 Insomnia, unspecified: Secondary | ICD-10-CM

## 2012-12-30 MED ORDER — ZOLPIDEM TARTRATE 10 MG PO TABS: 10.0000 mg | ORAL_TABLET | Freq: Every evening | ORAL | Status: DC | PRN

## 2012-12-30 NOTE — Telephone Encounter (Signed)
According to notes from Chari Manning - patient is still taking temazepam 30mg  1tpoqhs. Not sure what rx was denied.  Please reconsider OKing Rx.  Patient is to have follow up with Marcelino Duster  - appt made 01/13/2013 at noon - pt aware of appt

## 2012-12-30 NOTE — Telephone Encounter (Signed)
Please reviewed and advise

## 2012-12-30 NOTE — Addendum Note (Signed)
Addended by: Ernestina Penna on: 12/30/2012 06:08 PM   Modules accepted: Orders

## 2012-12-30 NOTE — Telephone Encounter (Signed)
Last filled 05/18/12   Last seen 12/04/12   Refill and have nurse call in to pharmacy

## 2013-01-13 ENCOUNTER — Ambulatory Visit (INDEPENDENT_AMBULATORY_CARE_PROVIDER_SITE_OTHER): Payer: Medicare Other | Admitting: Pharmacist Clinician (PhC)/ Clinical Pharmacy Specialist

## 2013-01-13 VITALS — BP 147/73 | HR 72 | Resp 16

## 2013-01-13 DIAGNOSIS — N23 Unspecified renal colic: Secondary | ICD-10-CM

## 2013-01-13 NOTE — Progress Notes (Signed)
Subjective:    Patient ID: Amanda Hubbard, female    DOB: Jan 23, 1928, 79 y.o.   MRN: [id number]  HPI Chronic Pain stemming from scar tissue after kidney removed.  Patient has tried ultram and tylenol #3 with codeine with no success in pain relief.  She is noticeably uncomfortable during the visit.    Review of Systems  Constitutional: Positive for activity change, appetite change and fatigue.  Respiratory: Negative.   Cardiovascular: Negative.   Endocrine: Negative.   Genitourinary: Negative.   Allergic/Immunologic: Negative.   Neurological: Negative.   Psychiatric/Behavioral: Negative.        Objective:   Physical Exam  Constitutional: She appears well-developed and well-nourished.  Cardiovascular: Normal rate, regular rhythm and normal heart sounds.   Skin: Skin is warm and dry.  Psychiatric: She has a normal mood and affect. Her behavior is normal. Judgment and thought content normal.          Assessment & Plan:   Subjective:    Amanda Hubbard is a 79 y.o. female who presents for evaluation of pain. Records reviewed, and patient examined. Nature of the pain: from scare tissue where kidney was removed Location of the pain: right abdominal   Intensity: severe Exacerbating/relieving factors: sleep with Remus Loffler brings some relief Adverse effects of medications:  Review of Systems Cardiovascular: negative Hematologic/lymphatic: negative Musculoskeletal:negative Neurological: negative Behavioral/Psych: negative    Objective:    There were no vitals taken for this visit.  General Appearance:    Alert, cooperative, no distress, appears stated age  Head:    Normocephalic, without obvious abnormality, atraumatic  Eyes:    PERRL, conjunctiva/corneas clear, EOM's intact, fundi    benign, both eyes  Ears:    Normal TM's and external ear canals, both ears  Nose:   Nares normal, septum midline, mucosa normal, no drainage    or sinus tenderness  Throat:   Lips,  mucosa, and tongue normal; teeth and gums normal  Neck:   Supple, symmetrical, trachea midline, no adenopathy;    thyroid:  no enlargement/tenderness/nodules; no carotid   bruit or JVD  Back:     Symmetric, no curvature, ROM normal, no CVA tenderness  Lungs:     Clear to auscultation bilaterally, respirations unlabored  Chest Wall:    No tenderness or deformity   Heart:    Regular rate and rhythm, S1 and S2 normal, no murmur, rub   or gallop  Breast Exam:    No tenderness, masses, or nipple abnormality  Abdomen:     Soft, non-tender, bowel sounds active all four quadrants,    no masses, no organomegaly  Genitalia:    Normal female without lesion, discharge or tenderness  Rectal:    Normal tone, normal prostate, no masses or tenderness;   guaiac negative stool  Extremities:   Extremities normal, atraumatic, no cyanosis or edema  Pulses:   2+ and symmetric all extremities  Skin:   Skin color, texture, turgor normal, no rashes or lesions  Lymph nodes:   Cervical, supraclavicular, and axillary nodes normal  Neurologic:   CNII-XII intact, normal strength, sensation and reflexes    throughout   Radiographs: none    Assessment:    Assessment: Depression:  .    Plan:    1.  Patient will discontinue taking tramadol due to lack of benefit. 2.  Start hydrocodone/APAP 5/325mg  1/2 tablet twice a day as needed for pain. 3.  Patient counseled on risk associated with narcotic use such as  dependendency, impaired cognition, risk for falls, nausea, etc. 4.  Patient failed courses of remeron and cymbalta, neither helped with pain. 5.  Follow up in 4 weeks.

## 2013-01-20 ENCOUNTER — Other Ambulatory Visit: Payer: Self-pay | Admitting: Pharmacist Clinician (PhC)/ Clinical Pharmacy Specialist

## 2013-01-20 MED ORDER — TRAMADOL HCL 50 MG PO TBDP: 1.0000 | ORAL_TABLET | ORAL | Status: DC | PRN

## 2013-01-22 ENCOUNTER — Other Ambulatory Visit: Payer: Self-pay | Admitting: *Deleted

## 2013-01-22 NOTE — Telephone Encounter (Signed)
FYI- pt had ambien filled on 12/30/12

## 2013-01-22 NOTE — Telephone Encounter (Signed)
This prescription is early one half the way until it is time to refill

## 2013-01-27 ENCOUNTER — Other Ambulatory Visit: Payer: Self-pay | Admitting: *Deleted

## 2013-01-27 MED ORDER — TEMAZEPAM 30 MG PO CAPS: 30.0000 mg | ORAL_CAPSULE | Freq: Every day | ORAL | Status: DC

## 2013-01-27 NOTE — Telephone Encounter (Signed)
Patient last seen in office for chronic health check on 11-25-12. Last filled on 12-26-12 for #30. Please advise. Thank you

## 2013-01-28 ENCOUNTER — Telehealth: Payer: Self-pay | Admitting: Family Medicine

## 2013-02-19 ENCOUNTER — Other Ambulatory Visit: Payer: Self-pay | Admitting: Pharmacist

## 2013-02-19 DIAGNOSIS — G4700 Insomnia, unspecified: Secondary | ICD-10-CM

## 2013-02-19 MED ORDER — ZOLPIDEM TARTRATE 10 MG PO TABS: 10.0000 mg | ORAL_TABLET | Freq: Every evening | ORAL | Status: DC | PRN

## 2013-02-19 NOTE — Telephone Encounter (Signed)
Last filled 12/30/12   Phone in and have nurse inform patient

## 2013-02-24 ENCOUNTER — Telehealth: Payer: Self-pay | Admitting: *Deleted

## 2013-02-24 NOTE — Telephone Encounter (Signed)
Pt called in requesting refill on Ambien Per Ar[facility] this was just filled on 02/09/2013 Per [facility] pt should only be taking Restoril for sleep, cannot take both! Pt informed

## 2013-03-04 ENCOUNTER — Other Ambulatory Visit: Payer: Self-pay

## 2013-03-04 MED ORDER — TRAMADOL HCL 50 MG PO TABS: ORAL_TABLET | ORAL | Status: DC

## 2013-03-04 NOTE — Telephone Encounter (Signed)
[facility] in [place]

## 2013-03-04 NOTE — Telephone Encounter (Signed)
Last filled 01/20/13  Last seen 11/25/12   If approved print and have nurse call patient to pick up

## 2013-03-04 NOTE — Telephone Encounter (Signed)
Change to 1   3 times a day as needed #90

## 2013-03-12 ENCOUNTER — Telehealth: Payer: Self-pay | Admitting: Family Medicine

## 2013-03-18 NOTE — Telephone Encounter (Signed)
Pharmacy is handling pain mgt

## 2013-04-01 ENCOUNTER — Ambulatory Visit (INDEPENDENT_AMBULATORY_CARE_PROVIDER_SITE_OTHER): Payer: Medicare Other | Admitting: Family Medicine

## 2013-04-01 ENCOUNTER — Encounter: Payer: Self-pay | Admitting: Family Medicine

## 2013-04-01 VITALS — BP 175/85 | HR 135 | Temp 97.2°F | Ht 62.0 in | Wt 100.2 lb

## 2013-04-01 DIAGNOSIS — R109 Unspecified abdominal pain: Secondary | ICD-10-CM

## 2013-04-01 DIAGNOSIS — E785 Hyperlipidemia, unspecified: Secondary | ICD-10-CM

## 2013-04-01 DIAGNOSIS — I2510 Atherosclerotic heart disease of native coronary artery without angina pectoris: Secondary | ICD-10-CM

## 2013-04-01 DIAGNOSIS — G4700 Insomnia, unspecified: Secondary | ICD-10-CM

## 2013-04-01 DIAGNOSIS — I10 Essential (primary) hypertension: Secondary | ICD-10-CM

## 2013-04-01 LAB — POCT CBC
HCT, POC: 43.1 % (ref 37.7–47.9)
Lymph, poc: 2.3 (ref 0.6–3.4)
MCHC: 34.7 g/dL (ref 31.8–35.4)
POC Granulocyte: 10.6 — AB (ref 2–6.9)
POC LYMPH PERCENT: 17.9 %L (ref 10–50)
RDW, POC: 13.8 %
WBC: 13.1 10*3/uL — AB (ref 4.6–10.2)

## 2013-04-01 NOTE — Addendum Note (Signed)
Addended by: Bearl Mulberry on: 04/01/2013 04:53 PM   Modules accepted: Orders

## 2013-04-01 NOTE — Patient Instructions (Addendum)
Always be careful and decrease the risk of falling Continue therapeutic lifestyle changes which is diet and exercise Drink plenty of fluids Take medications as directed

## 2013-04-01 NOTE — Progress Notes (Signed)
  Subjective:    Patient ID: Amanda Hubbard, female    DOB: 1928-03-05, 79 y.o.   MRN: [id number]  HPI Patient returns to clinic today for followup of chronic medical problems. He has a history of hyperlipidemia, hypertension, coronary artery disease, mitral valve prolapse, osteopenia, and insomnia. She saw a gastroenterologist for a complex cystic mass that is enlarging in the head of the pancreas. At that time it was discussed that she did not want to pursue evaluating this any further. Her son was present at that meeting.   Review of Systems  Constitutional: Positive for appetite change (decreased) and fatigue (worsening).  HENT: Negative.   Eyes: Negative.   Respiratory: Negative.   Cardiovascular: Negative.   Gastrointestinal: Positive for constipation (daily).  Genitourinary: Negative.   Musculoskeletal: Positive for back pain (LBP, R>L) and arthralgias (R hip, feet).  Skin: Negative.   Neurological: Positive for dizziness, tremors, weakness and numbness (legs and feet).  Psychiatric/Behavioral: Positive for sleep disturbance (wakes and can't get back to sleep). The patient is nervous/anxious.        Objective:   Physical Exam BP 175/85  Pulse 135  Temp(Src) 97.2 F (36.2 C) (Oral)  Ht 5\' 2"  (1.575 m)  Wt 100 lb 3.2 oz (45.45 kg)  BMI 18.32 kg/m2  The patient appeared well nourished and normally developed, alert and oriented to time and place. Speech, behavior and judgement appear normal. Vital signs as documented.  Head exam is unremarkable. No scleral icterus or pallor noted.  Neck is without jugular venous distension, thyromegally, or carotid bruits. Carotid upstrokes are brisk bilaterally. No cervical adenopathy. Lungs are clear anteriorly and posteriorly to auscultation. Normal respiratory effort. Cardiac exam reveals regular rate and rhythm. First and second heart sounds normal.  No murmurs, rubs or gallops.  Abdominal exam reveals normal bowl sounds, no masses, no  organomegaly and no aortic enlargement. No inguinal adenopathy. Extremities are nonedematous and both femoral and pedal pulses are normal. Skin without pallor or jaundice.  Warm and dry, without rash. Neurologic exam reveals normal deep tendon reflexes and normal sensation.          Assessment & Plan:  1. HYPERTENSION  2. CAD  3. DYSLIPIDEMIA  4. INSOMNIA  5. Abdominal pain, with complex cystic mass in the head of the pancreas   Labs will be drawn today which will include a CBC, BMP, liver function tests, amylase,lipase  Patient Instructions  Always be careful and decrease the risk of falling Continue therapeutic lifestyle changes which is diet and exercise Drink plenty of fluids Take medications as directed   I will discuss with a clinical pharmacist other options for helping her with her pain and insomnia

## 2013-04-02 LAB — HEPATIC FUNCTION PANEL
ALT: 8 U/L (ref 0–35)
AST: 13 U/L (ref 0–37)
Albumin: 4.5 g/dL (ref 3.5–5.2)
Total Bilirubin: 0.9 mg/dL (ref 0.3–1.2)

## 2013-04-02 LAB — BASIC METABOLIC PANEL WITH GFR
BUN: 13 mg/dL (ref 6–23)
Calcium: 9.5 mg/dL (ref 8.4–10.5)
GFR, Est African American: 46 mL/min — ABNORMAL LOW
Glucose, Bld: 96 mg/dL (ref 70–99)
Sodium: 142 mEq/L (ref 135–145)

## 2013-04-02 LAB — LIPID PANEL: Cholesterol: 267 mg/dL — ABNORMAL HIGH (ref 0–200)

## 2013-04-13 ENCOUNTER — Other Ambulatory Visit: Payer: Self-pay | Admitting: *Deleted

## 2013-04-13 MED ORDER — AMLODIPINE BESYLATE 5 MG PO TABS: 5.0000 mg | ORAL_TABLET | Freq: Every day | ORAL | Status: DC

## 2013-04-15 ENCOUNTER — Other Ambulatory Visit: Payer: Self-pay

## 2013-04-15 ENCOUNTER — Telehealth: Payer: Self-pay | Admitting: *Deleted

## 2013-04-15 MED ORDER — TRAMADOL HCL 50 MG PO TABS: ORAL_TABLET | ORAL | Status: DC

## 2013-04-15 NOTE — Telephone Encounter (Signed)
The dose was changed to one every 12 hours if needed with no refill

## 2013-04-15 NOTE — Telephone Encounter (Signed)
Last seen 04/01/13  DWM   If approved print and have nurse call patient to pick up

## 2013-04-15 NOTE — Telephone Encounter (Signed)
Message copied by Bearl Mulberry on Wed Apr 15, 2013  7:23 PM ------      Message from: Ernestina Penna      Created: Wed Apr 01, 2013  6:04 PM       The CBC had a white blood cell count that was slightly elevated and a little bit higher than it was 6 months ago . The hemoglobin was good at 14.9. Platelet count is adequate . ------

## 2013-04-15 NOTE — Telephone Encounter (Signed)
Pt notified of results

## 2013-04-15 NOTE — Telephone Encounter (Signed)
This may be refill one time, but changed the directions to 1 twice daily if needed

## 2013-05-14 ENCOUNTER — Other Ambulatory Visit: Payer: Self-pay

## 2013-05-14 MED ORDER — TRAMADOL HCL 50 MG PO TABS: ORAL_TABLET | ORAL | Status: DC

## 2013-05-14 NOTE — Telephone Encounter (Signed)
Approves if on time and has not taken too much

## 2013-05-14 NOTE — Telephone Encounter (Signed)
Last seen DWM  04/01/13  Last filled 04/15/13   If approved print and route to nurse

## 2013-05-14 NOTE — Telephone Encounter (Signed)
Per pharmacy, pt just had Tramadol filled on 05/11/2013 #30 Rx denied

## 2013-05-21 ENCOUNTER — Other Ambulatory Visit: Payer: Self-pay | Admitting: *Deleted

## 2013-05-21 MED ORDER — TEMAZEPAM 30 MG PO CAPS: 30.0000 mg | ORAL_CAPSULE | Freq: Every day | ORAL | Status: DC

## 2013-05-21 NOTE — Telephone Encounter (Signed)
LAST OV 04/01/13. LAST RF 04/23/13. CALL IN DRUG STORE IF APPROVED.

## 2013-05-21 NOTE — Telephone Encounter (Signed)
This prescription and will be okay if she is on time with this med

## 2013-05-21 NOTE — Telephone Encounter (Signed)
See rx. 

## 2013-05-25 ENCOUNTER — Other Ambulatory Visit: Payer: Self-pay | Admitting: *Deleted

## 2013-05-25 ENCOUNTER — Telehealth: Payer: Self-pay | Admitting: Family Medicine

## 2013-05-25 MED ORDER — TRAMADOL HCL 50 MG PO TABS: ORAL_TABLET | ORAL | Status: DC

## 2013-05-25 NOTE — Telephone Encounter (Signed)
Patient last seen in office on 04-01-13. Rx last filled on 05-11-13 for #30. If approved please print and route to Pool A so nurse can call pt to come and pick up   Christell Constant)

## 2013-05-25 NOTE — Telephone Encounter (Signed)
Called into the drug store  

## 2013-05-25 NOTE — Telephone Encounter (Signed)
This can be refilled if it is on time

## 2013-05-27 NOTE — Telephone Encounter (Signed)
Called in Temazepam.

## 2013-06-15 ENCOUNTER — Telehealth: Payer: Self-pay | Admitting: Family Medicine

## 2013-06-16 ENCOUNTER — Other Ambulatory Visit: Payer: Self-pay

## 2013-06-16 MED ORDER — AMLODIPINE BESYLATE 5 MG PO TABS: 5.0000 mg | ORAL_TABLET | Freq: Every day | ORAL | Status: DC

## 2013-06-16 MED ORDER — TRAMADOL HCL 50 MG PO TABS: ORAL_TABLET | ORAL | Status: DC

## 2013-06-16 NOTE — Telephone Encounter (Signed)
As long as his prescription is on time this prescription is okay for one month

## 2013-06-16 NOTE — Telephone Encounter (Signed)
Last filled 05/25/13  Last seen 04/01/13  DWM   If approved print and route to nurse

## 2013-06-16 NOTE — Telephone Encounter (Signed)
done

## 2013-06-16 NOTE — Telephone Encounter (Signed)
Patient aware to pick 

## 2013-06-22 ENCOUNTER — Telehealth: Payer: Self-pay | Admitting: *Deleted

## 2013-06-22 DIAGNOSIS — M549 Dorsalgia, unspecified: Secondary | ICD-10-CM

## 2013-06-22 NOTE — Telephone Encounter (Signed)
Wants referral to someone who can do injections in her back  Prefers [place] [place]  No ortho now per pt.  For - back pain

## 2013-06-22 NOTE — Telephone Encounter (Signed)
Please see Dr. Channing Mutters would be willing to see her and offer her treatment

## 2013-06-23 NOTE — Telephone Encounter (Signed)
Pt aware by VM that we will set up referral to see dr Channing Mutters

## 2013-06-25 ENCOUNTER — Other Ambulatory Visit: Payer: Medicare Other

## 2013-06-25 ENCOUNTER — Ambulatory Visit: Payer: Medicare Other

## 2013-06-25 ENCOUNTER — Ambulatory Visit (INDEPENDENT_AMBULATORY_CARE_PROVIDER_SITE_OTHER): Payer: Medicare Other

## 2013-06-25 DIAGNOSIS — M549 Dorsalgia, unspecified: Secondary | ICD-10-CM

## 2013-06-25 NOTE — Addendum Note (Signed)
Addended by: Magdalene River on: 06/25/2013 12:03 PM   Modules accepted: Orders

## 2013-06-29 ENCOUNTER — Telehealth: Payer: Self-pay | Admitting: Family Medicine

## 2013-07-02 NOTE — Telephone Encounter (Signed)
Pt's husband notified of xray results Referral already in system for Dr Channing Mutters Pt's husband verbalizes understanding

## 2013-07-13 ENCOUNTER — Other Ambulatory Visit: Payer: Self-pay

## 2013-07-13 MED ORDER — TRAMADOL HCL 50 MG PO TABS: ORAL_TABLET | ORAL | Status: DC

## 2013-07-13 NOTE — Telephone Encounter (Signed)
This may be refilled if it is on time

## 2013-07-13 NOTE — Telephone Encounter (Signed)
Last seen 04/01/13  DWM  If approved print and route to nurse 

## 2013-07-14 NOTE — Telephone Encounter (Signed)
Called to the drug store 

## 2013-07-16 ENCOUNTER — Other Ambulatory Visit: Payer: Self-pay

## 2013-07-16 DIAGNOSIS — M549 Dorsalgia, unspecified: Secondary | ICD-10-CM

## 2013-07-20 ENCOUNTER — Other Ambulatory Visit: Payer: Self-pay

## 2013-07-20 MED ORDER — ESTROGENS, CONJUGATED 0.625 MG/GM VA CREA: TOPICAL_CREAM | VAGINAL | Status: DC

## 2013-07-22 ENCOUNTER — Other Ambulatory Visit: Payer: Self-pay

## 2013-07-22 MED ORDER — TEMAZEPAM 30 MG PO CAPS: 30.0000 mg | ORAL_CAPSULE | Freq: Every day | ORAL | Status: DC

## 2013-07-22 NOTE — Telephone Encounter (Signed)
This is okay x1 month if it is due and on time. Please double check this with the pharmacist

## 2013-07-22 NOTE — Telephone Encounter (Signed)
Last seen 04/01/13  DWM 

## 2013-07-23 NOTE — Telephone Encounter (Signed)
Pt med called in to pharm- the drug store

## 2013-08-03 ENCOUNTER — Other Ambulatory Visit: Payer: Medicare Other

## 2013-08-11 ENCOUNTER — Other Ambulatory Visit: Payer: Self-pay

## 2013-08-11 MED ORDER — TRAMADOL HCL 50 MG PO TABS: ORAL_TABLET | ORAL | Status: DC

## 2013-08-11 NOTE — Telephone Encounter (Signed)
Ok to fill on 08/12/13 Patient notified to pickup script with ID tomorrow

## 2013-08-11 NOTE — Telephone Encounter (Signed)
Please make sure that this is being taken correctly and is not being prematurely filled before refilling

## 2013-08-11 NOTE — Telephone Encounter (Signed)
Last seen 04/01/13  DWM  If approved print and route to nurse 

## 2013-08-13 ENCOUNTER — Telehealth: Payer: Self-pay | Admitting: Family Medicine

## 2013-09-07 ENCOUNTER — Other Ambulatory Visit: Payer: Self-pay

## 2013-09-07 MED ORDER — TRAMADOL HCL 50 MG PO TABS: ORAL_TABLET | ORAL | Status: DC

## 2013-09-07 NOTE — Telephone Encounter (Signed)
Last seen 04/01/13  DWM  If approved print and route to nurse 

## 2013-09-16 ENCOUNTER — Other Ambulatory Visit: Payer: Self-pay

## 2013-09-16 MED ORDER — TEMAZEPAM 30 MG PO CAPS: 30.0000 mg | ORAL_CAPSULE | Freq: Every day | ORAL | Status: DC

## 2013-09-16 NOTE — Telephone Encounter (Signed)
This can be refilled only if it is on time

## 2013-09-16 NOTE — Telephone Encounter (Signed)
Last seen 04/01/13  DWM   If approved route to nurse to phone into Drug store

## 2013-09-18 NOTE — Telephone Encounter (Signed)
Called to Drug Store 

## 2013-10-19 ENCOUNTER — Other Ambulatory Visit: Payer: Self-pay | Admitting: *Deleted

## 2013-10-19 MED ORDER — TEMAZEPAM 30 MG PO CAPS: 30.0000 mg | ORAL_CAPSULE | Freq: Every day | ORAL | Status: DC

## 2013-10-19 NOTE — Telephone Encounter (Signed)
Called into the [facility] per dwm

## 2013-10-19 NOTE — Telephone Encounter (Signed)
This is okay to refill 

## 2013-10-19 NOTE — Telephone Encounter (Signed)
Last seen in office in 03-2013. Please advise. If approved please route to Pool A so [facility]

## 2013-10-27 ENCOUNTER — Other Ambulatory Visit: Payer: Self-pay | Admitting: *Deleted

## 2013-10-27 ENCOUNTER — Telehealth: Payer: Self-pay | Admitting: Family Medicine

## 2013-10-27 MED ORDER — TRAMADOL HCL 50 MG PO TABS: ORAL_TABLET | ORAL | Status: DC

## 2013-11-16 ENCOUNTER — Other Ambulatory Visit: Payer: Self-pay

## 2013-11-16 MED ORDER — AMLODIPINE BESYLATE 5 MG PO TABS: 5.0000 mg | ORAL_TABLET | Freq: Every day | ORAL | Status: DC

## 2013-11-16 NOTE — Telephone Encounter (Signed)
Last seen 04/01/13  DWM

## 2013-11-17 ENCOUNTER — Ambulatory Visit: Payer: Medicare Other | Admitting: Family Medicine

## 2013-11-17 ENCOUNTER — Other Ambulatory Visit: Payer: Self-pay | Admitting: *Deleted

## 2013-11-17 MED ORDER — TEMAZEPAM 30 MG PO CAPS: 30.0000 mg | ORAL_CAPSULE | Freq: Every day | ORAL | Status: DC

## 2013-11-17 NOTE — Telephone Encounter (Signed)
Last filled 10/19/13, last seen 04/01/13

## 2013-11-18 ENCOUNTER — Telehealth: Payer: Self-pay | Admitting: Family Medicine

## 2013-11-18 NOTE — Telephone Encounter (Signed)
Pt aware  done

## 2013-12-01 ENCOUNTER — Other Ambulatory Visit: Payer: Self-pay

## 2013-12-01 MED ORDER — TRAMADOL HCL 50 MG PO TABS: ORAL_TABLET | ORAL | Status: DC

## 2013-12-01 NOTE — Telephone Encounter (Signed)
Last seen 04/01/13  DWM  If approved print and route to nurse

## 2013-12-01 NOTE — Telephone Encounter (Signed)
This is okay if it is on time

## 2013-12-10 ENCOUNTER — Encounter: Payer: Self-pay | Admitting: Family Medicine

## 2013-12-10 ENCOUNTER — Ambulatory Visit (INDEPENDENT_AMBULATORY_CARE_PROVIDER_SITE_OTHER): Payer: Medicare Other

## 2013-12-10 ENCOUNTER — Ambulatory Visit (INDEPENDENT_AMBULATORY_CARE_PROVIDER_SITE_OTHER): Payer: Medicare Other | Admitting: Family Medicine

## 2013-12-10 VITALS — BP 128/77 | HR 93 | Temp 98.7°F | Ht 62.0 in | Wt 102.0 lb

## 2013-12-10 DIAGNOSIS — M949 Disorder of cartilage, unspecified: Secondary | ICD-10-CM

## 2013-12-10 DIAGNOSIS — I10 Essential (primary) hypertension: Secondary | ICD-10-CM

## 2013-12-10 DIAGNOSIS — R2681 Unsteadiness on feet: Secondary | ICD-10-CM

## 2013-12-10 DIAGNOSIS — E559 Vitamin D deficiency, unspecified: Secondary | ICD-10-CM

## 2013-12-10 DIAGNOSIS — M899 Disorder of bone, unspecified: Secondary | ICD-10-CM

## 2013-12-10 DIAGNOSIS — E785 Hyperlipidemia, unspecified: Secondary | ICD-10-CM

## 2013-12-10 DIAGNOSIS — R269 Unspecified abnormalities of gait and mobility: Secondary | ICD-10-CM

## 2013-12-10 DIAGNOSIS — I2510 Atherosclerotic heart disease of native coronary artery without angina pectoris: Secondary | ICD-10-CM

## 2013-12-10 LAB — POCT CBC
GRANULOCYTE PERCENT: 65.8 % (ref 37–80)
HEMATOCRIT: 43.4 % (ref 37.7–47.9)
Hemoglobin: 13.5 g/dL (ref 12.2–16.2)
Lymph, poc: 1.7 (ref 0.6–3.4)
MCH, POC: 28.5 pg (ref 27–31.2)
MCHC: 31.2 g/dL — AB (ref 31.8–35.4)
MCV: 91.5 fL (ref 80–97)
MPV: 8.4 fL (ref 0–99.8)
POC Granulocyte: 5.1 (ref 2–6.9)
POC LYMPH PERCENT: 22.4 %L (ref 10–50)
Platelet Count, POC: 255 10*3/uL (ref 142–424)
RBC: 4.7 M/uL (ref 4.04–5.48)
RDW, POC: 14.7 %
WBC: 7.7 10*3/uL (ref 4.6–10.2)

## 2013-12-10 NOTE — Progress Notes (Signed)
Subjective:    Patient ID: Amanda Hubbard, female    DOB: 04/20/1928, 80 y.o.   MRN: [id number]  HPI Pt here for follow up and management of chronic medical problems. The patient comes to the visit today with her son. She has not been in the office for a good while. She has a long long history of refusing any kind of medical treatment. The family is aware of the this time indicates that she stays in the bed and rarely gets out of the bed during the day. He indicated that he would like for [place] to get home health services and. I think this is a good idea and it may help her with gait training and gait strengthening. She refuses any health maintenance request today other than getting lab work and a chest x-ray. She has had a cough and congestion for a few days but denies fever or yellow sputum. She indicates that she may be getting somewhat better with this.        Patient Active Problem List   Diagnosis Date Noted  . DYSLIPIDEMIA 12/21/2008  . HYPERTENSION 12/21/2008  . CAD 12/21/2008  . MITRAL VALVE PROLAPSE 12/21/2008  . OSTEOPENIA 12/21/2008  . INSOMNIA 12/21/2008   Outpatient Encounter Prescriptions as of 12/10/2013  Medication Sig  . amLODipine (NORVASC) 5 MG tablet Take 1 tablet (5 mg total) by mouth daily.  Marland Kitchen aspirin 81 MG tablet Take 81 mg by mouth daily.    Marland Kitchen conjugated estrogens (PREMARIN) vaginal cream As directed  . temazepam (RESTORIL) 30 MG capsule Take 1 capsule (30 mg total) by mouth at bedtime.  . traMADol (ULTRAM) 50 MG tablet One tablet every 12 hours as needed  . [DISCONTINUED] ergocalciferol (VITAMIN D2) 50000 UNITS capsule Take 50,000 Units by mouth once a week.    . [DISCONTINUED] HYDROcodone-acetaminophen (NORCO/VICODIN) 5-325 MG per tablet     Review of Systems  Constitutional: Negative.   HENT: Positive for congestion.   Eyes: Negative.   Respiratory: Positive for cough.   Cardiovascular: Negative.   Gastrointestinal: Negative.   Endocrine: Negative.     Genitourinary: Negative.   Musculoskeletal: Positive for back pain.  Skin: Negative.   Allergic/Immunologic: Negative.   Neurological: Negative.   Hematological: Negative.   Psychiatric/Behavioral: Negative.        Objective:   Physical Exam  Nursing note and vitals reviewed. Constitutional: She is oriented to person, place, and time. She appears well-developed and well-nourished. No distress.  Thin frail but alert  HENT:  Head: Normocephalic and atraumatic.  Right Ear: External ear normal.  Left Ear: External ear normal.  Nose: Nose normal.  Mouth/Throat: Oropharynx is clear and moist. No oropharyngeal exudate.  Eyes: Conjunctivae and EOM are normal. Pupils are equal, round, and reactive to light. Right eye exhibits no discharge. Left eye exhibits no discharge. No scleral icterus.  Neck: Normal range of motion. Neck supple. No thyromegaly present.  Cardiovascular: Normal rate, regular rhythm, normal heart sounds and intact distal pulses.  Exam reveals no gallop and no friction rub.   No murmur heard. At 72 per minute  Pulmonary/Chest: Effort normal and breath sounds normal. No respiratory distress. She has no wheezes. She has no rales. She exhibits no tenderness.  Dry cough. No axillary nodes.  Abdominal: Soft. Bowel sounds are normal. She exhibits no mass. There is no tenderness. There is no rebound and no guarding.  Patient has a several year history of an abdominal mass but on exam there were  no masses palpable today. Because of his long history of a have to assume that whatever is there is benign.  Musculoskeletal: Normal range of motion. She exhibits no edema and no tenderness.  Lymphadenopathy:    She has no cervical adenopathy.  Neurological: She is alert and oriented to person, place, and time. She has normal reflexes. No cranial nerve deficit.  Skin: Skin is warm and dry. No rash noted.  Psychiatric: She has a normal mood and affect. Her behavior is normal. Thought  content normal.  Stubborn but alert!!!!   BP 128/77  Pulse 93  Temp(Src) 98.7 F (37.1 C) (Oral)  Ht 5' 2"  (1.575 m)  Wt 102 lb (46.267 kg)  BMI 18.65 kg/m2  [facility] reading (PRIMARY) by  Dr. Brunilda Payor x-ray   --N0 Active disease, deg changes                                    Assessment & Plan:  1. CAD - POCT CBC - BMP8+EGFR - Hepatic function panel  2. HYPERTENSION - POCT CBC - BMP8+EGFR - Hepatic function panel - DG Chest 2 View; Future  3. DYSLIPIDEMIA - POCT CBC - NMR, lipoprofile  4. OSTEOPENIA - POCT CBC - Vit D  25 hydroxy (rtn osteoporosis monitoring) - DG Chest 2 View; Future  5. Vitamin D deficiency - Vit D  25 hydroxy (rtn osteoporosis monitoring)  6. Unstable gait -Home Health/physical therapy  Patient Instructions                       Medicare Annual Wellness Visit  [facility] and the medical providers at [facility] strive to bring you the best medical care.  In doing so we not only want to address your current medical conditions and concerns but also to detect new conditions early and prevent illness, disease and health-related problems.    Medicare offers a yearly Wellness Visit which allows our clinical staff to assess your need for preventative services including immunizations, lifestyle education, counseling to decrease risk of preventable diseases and screening for fall risk and other medical concerns.    This visit is provided free of charge (no copay) for all Medicare recipients. The clinical pharmacists at [facility] have begun to conduct these Wellness Visits which will also include a thorough review of all your medications.    As you primary medical provider recommend that you make an appointment for your Annual Wellness Visit if you have not done so already this year.  You may set up this appointment before you leave today or you may call back [phone]) and schedule an appointment.  Please  make sure when you call that you mention that you are scheduling your Annual Wellness Visit with the clinical pharmacist so that the appointment may be made for the proper length of time.     Continue current medications. Continue good therapeutic lifestyle changes which include good diet and exercise. Fall precautions discussed with patient. If an FOBT was given today- please return it to our front desk. If you are over 29 years old - you may need Prevnar 71 or the adult Pneumonia vaccine.  Continue to drink plenty of fluids Try to get more active so that you can strengthen her muscles as this will keep you from falling. We'll call you with the results of the blood work and the x-ray results  once those results are available.   Arrie Senate MD

## 2013-12-10 NOTE — Patient Instructions (Addendum)
Medicare Annual Wellness Visit  [facility] and the medical providers at [facility] strive to bring you the best medical care.  In doing so we not only want to address your current medical conditions and concerns but also to detect new conditions early and prevent illness, disease and health-related problems.    Medicare offers a yearly Wellness Visit which allows our clinical staff to assess your need for preventative services including immunizations, lifestyle education, counseling to decrease risk of preventable diseases and screening for fall risk and other medical concerns.    This visit is provided free of charge (no copay) for all Medicare recipients. The clinical pharmacists at [facility] have begun to conduct these Wellness Visits which will also include a thorough review of all your medications.    As you primary medical provider recommend that you make an appointment for your Annual Wellness Visit if you have not done so already this year.  You may set up this appointment before you leave today or you may call back [phone]) and schedule an appointment.  Please make sure when you call that you mention that you are scheduling your Annual Wellness Visit with the clinical pharmacist so that the appointment may be made for the proper length of time.     Continue current medications. Continue good therapeutic lifestyle changes which include good diet and exercise. Fall precautions discussed with patient. If an FOBT was given today- please return it to our front desk. If you are over 12 years old - you may need Prevnar 8 or the adult Pneumonia vaccine.  Continue to drink plenty of fluids Try to get more active so that you can strengthen her muscles as this will keep you from falling. We'll call you with the results of the blood work and the x-ray results once those results are available.

## 2013-12-12 LAB — SPECIMEN STATUS REPORT

## 2013-12-14 ENCOUNTER — Other Ambulatory Visit: Payer: Self-pay | Admitting: Family Medicine

## 2013-12-14 LAB — HEPATIC FUNCTION PANEL
ALT: 9 IU/L (ref 0–32)
AST: 21 IU/L (ref 0–40)
Albumin: 4.4 g/dL (ref 3.5–4.7)
Alkaline Phosphatase: 62 IU/L (ref 39–117)
Bilirubin, Direct: 0.15 mg/dL (ref 0.00–0.40)
Total Bilirubin: 0.8 mg/dL (ref 0.0–1.2)
Total Protein: 6.9 g/dL (ref 6.0–8.5)

## 2013-12-14 LAB — BMP8+EGFR
BUN / CREAT RATIO: 14 (ref 11–26)
BUN: 20 mg/dL (ref 8–27)
CO2: 22 mmol/L (ref 18–29)
CREATININE: 1.45 mg/dL — AB (ref 0.57–1.00)
Calcium: 9.5 mg/dL (ref 8.7–10.3)
Chloride: 99 mmol/L (ref 97–108)
GFR calc non Af Amer: 33 mL/min/{1.73_m2} — ABNORMAL LOW (ref >59)
GFR, EST AFRICAN AMERICAN: 38 mL/min/{1.73_m2} — AB (ref >59)
Glucose: 88 mg/dL (ref 65–99)
Potassium: 4.3 mmol/L (ref 3.5–5.2)
Sodium: 142 mmol/L (ref 134–144)

## 2013-12-14 LAB — NMR, LIPOPROFILE

## 2013-12-14 LAB — VITAMIN D 25 HYDROXY (VIT D DEFICIENCY, FRACTURES): Vit D, 25-Hydroxy: 16.4 ng/mL — ABNORMAL LOW (ref 30.0–100.0)

## 2013-12-14 NOTE — Progress Notes (Signed)
Referral made to [facility] for PT

## 2013-12-14 NOTE — Progress Notes (Unsigned)
Referral made to [facility] for physical therapy

## 2013-12-15 LAB — LIPID PANEL
CHOLESTEROL TOTAL: 285 mg/dL — AB (ref 100–199)
Chol/HDL Ratio: 4.5 ratio units — ABNORMAL HIGH (ref 0.0–4.4)
HDL: 64 mg/dL (ref >39)
LDL CALC: 195 mg/dL — AB (ref 0–99)
Triglycerides: 128 mg/dL (ref 0–149)
VLDL CHOLESTEROL CAL: 26 mg/dL (ref 5–40)

## 2013-12-15 LAB — SPECIMEN STATUS REPORT

## 2013-12-16 NOTE — Telephone Encounter (Signed)
dwm to sign hard copy

## 2013-12-16 NOTE — Telephone Encounter (Signed)
Last refill 11/18/13. Route to pool to call in to  [facility].

## 2013-12-28 ENCOUNTER — Other Ambulatory Visit: Payer: Self-pay | Admitting: Family Medicine

## 2013-12-29 DIAGNOSIS — M899 Disorder of bone, unspecified: Secondary | ICD-10-CM

## 2013-12-29 DIAGNOSIS — I10 Essential (primary) hypertension: Secondary | ICD-10-CM

## 2013-12-29 DIAGNOSIS — M949 Disorder of cartilage, unspecified: Secondary | ICD-10-CM

## 2013-12-29 DIAGNOSIS — I2510 Atherosclerotic heart disease of native coronary artery without angina pectoris: Secondary | ICD-10-CM

## 2013-12-29 DIAGNOSIS — R269 Unspecified abnormalities of gait and mobility: Secondary | ICD-10-CM

## 2013-12-29 DIAGNOSIS — IMO0001 Reserved for inherently not codable concepts without codable children: Secondary | ICD-10-CM

## 2013-12-29 NOTE — Telephone Encounter (Signed)
This may be approved as long as it is not too early

## 2013-12-29 NOTE — Telephone Encounter (Signed)
Last ov 12/10/13. Last refill 12/01/13. Please print if approved.

## 2014-01-13 ENCOUNTER — Other Ambulatory Visit: Payer: Self-pay | Admitting: Family Medicine

## 2014-01-14 NOTE — Telephone Encounter (Signed)
This may be refilled with a refill date of 01/17/2014

## 2014-01-14 NOTE — Telephone Encounter (Signed)
Patient last seen in office on 12-10-13. Rx last filled on 12-17-13. Please advise. If approved please route to Pool A so nurse can phone in to pharmacy

## 2014-01-14 NOTE — Telephone Encounter (Signed)
Refill authorization given

## 2014-02-01 ENCOUNTER — Other Ambulatory Visit: Payer: Self-pay | Admitting: Family Medicine

## 2014-02-02 NOTE — Telephone Encounter (Signed)
Patient last seen in office on 12-10-13. Last refill was 09-10-13 for #60. Please advise. If approved please route to Pool A so nurse can call patient to pick up

## 2014-02-02 NOTE — Telephone Encounter (Signed)
Please let patient know that prescription is available for pickup

## 2014-02-10 ENCOUNTER — Other Ambulatory Visit: Payer: Self-pay | Admitting: Family Medicine

## 2014-03-01 ENCOUNTER — Other Ambulatory Visit: Payer: Self-pay | Admitting: Family Medicine

## 2014-03-03 NOTE — Telephone Encounter (Signed)
Last ov 12/10/13. Last refill 02/03/14. Print and call pt when ready.

## 2014-03-03 NOTE — Telephone Encounter (Signed)
If on time ,this is ok

## 2014-03-04 MED ORDER — TRAMADOL HCL 50 MG PO TABS: 50.0000 mg | ORAL_TABLET | Freq: Two times a day (BID) | ORAL | Status: DC

## 2014-03-04 NOTE — Telephone Encounter (Signed)
On time. Last filled 02/03/14. Pt aware to pickup with photo ID

## 2014-03-04 NOTE — Addendum Note (Signed)
Addended by: Ilean China on: 03/04/2014 02:21 PM   Modules accepted: Orders

## 2014-03-15 ENCOUNTER — Other Ambulatory Visit: Payer: Self-pay | Admitting: Family Medicine

## 2014-03-30 ENCOUNTER — Other Ambulatory Visit: Payer: Self-pay | Admitting: Family Medicine

## 2014-03-31 NOTE — Telephone Encounter (Signed)
Patient last seen in office on 12-10-13. Rx last filled on 03-05-14 for #60. Please advise. If approved please print and route to Pool A so nurse can call patient to pick up

## 2014-03-31 NOTE — Telephone Encounter (Signed)
This is okay to refill 

## 2014-04-13 ENCOUNTER — Other Ambulatory Visit: Payer: Self-pay | Admitting: Family Medicine

## 2014-04-22 ENCOUNTER — Encounter: Payer: Self-pay | Admitting: Family Medicine

## 2014-04-22 ENCOUNTER — Ambulatory Visit (INDEPENDENT_AMBULATORY_CARE_PROVIDER_SITE_OTHER): Payer: Medicare Other | Admitting: Family Medicine

## 2014-04-22 VITALS — BP 146/78 | HR 81 | Temp 97.5°F | Ht 62.0 in | Wt 103.0 lb

## 2014-04-22 DIAGNOSIS — M949 Disorder of cartilage, unspecified: Secondary | ICD-10-CM

## 2014-04-22 DIAGNOSIS — E559 Vitamin D deficiency, unspecified: Secondary | ICD-10-CM

## 2014-04-22 DIAGNOSIS — E785 Hyperlipidemia, unspecified: Secondary | ICD-10-CM

## 2014-04-22 DIAGNOSIS — M899 Disorder of bone, unspecified: Secondary | ICD-10-CM

## 2014-04-22 DIAGNOSIS — I2510 Atherosclerotic heart disease of native coronary artery without angina pectoris: Secondary | ICD-10-CM

## 2014-04-22 DIAGNOSIS — I10 Essential (primary) hypertension: Secondary | ICD-10-CM

## 2014-04-22 LAB — POCT CBC
Granulocyte percent: 67.8 %G (ref 37–80)
HCT, POC: 40.9 % (ref 37.7–47.9)
Hemoglobin: 13.2 g/dL (ref 12.2–16.2)
Lymph, poc: 2.9 (ref 0.6–3.4)
MCH, POC: 30.2 pg (ref 27–31.2)
MCHC: 32.2 g/dL (ref 31.8–35.4)
MCV: 93.7 fL (ref 80–97)
MPV: 8.5 fL (ref 0–99.8)
POC GRANULOCYTE: 6.8 (ref 2–6.9)
POC LYMPH %: 29.1 % (ref 10–50)
Platelet Count, POC: 283 10*3/uL (ref 142–424)
RBC: 4.4 M/uL (ref 4.04–5.48)
RDW, POC: 14.5 %
WBC: 10 10*3/uL (ref 4.6–10.2)

## 2014-04-22 NOTE — Patient Instructions (Addendum)
Medicare Annual Wellness Visit  [facility] and the medical providers at [facility] strive to bring you the best medical care.  In doing so we not only want to address your current medical conditions and concerns but also to detect new conditions early and prevent illness, disease and health-related problems.    Medicare offers a yearly Wellness Visit which allows our clinical staff to assess your need for preventative services including immunizations, lifestyle education, counseling to decrease risk of preventable diseases and screening for fall risk and other medical concerns.    This visit is provided free of charge (no copay) for all Medicare recipients. The clinical pharmacists at [facility] have begun to conduct these Wellness Visits which will also include a thorough review of all your medications.    As you primary medical provider recommend that you make an appointment for your Annual Wellness Visit if you have not done so already this year.  You may set up this appointment before you leave today or you may call back [phone]) and schedule an appointment.  Please make sure when you call that you mention that you are scheduling your Annual Wellness Visit with the clinical pharmacist so that the appointment may be made for the proper length of time.     Continue current medications. Continue good therapeutic lifestyle changes which include good diet and exercise. Fall precautions discussed with patient. If an FOBT was given today- please return it to our front desk. If you are over 35 years old - you may need Prevnar 12 or the adult Pneumonia vaccine.  Try to get out of the home more and get more exercise Walking is the best exercise Drink plenty of fluids We will call you with the results of the lab work once those results are available We will schedule you for a visit with the cardiologist if it is determined that you  need one

## 2014-04-22 NOTE — Progress Notes (Signed)
Subjective:    Patient ID: Amanda Hubbard, female    DOB: 06-10-1928, 80 y.o.   MRN: [id number]  HPI Pt here for follow up and management of chronic medical problems. The patient is pretty much home confined. She does not want to get out of the house and watches television most of the day. She is alert and cooperative but refuses to do any preventative measures. She will get lab work today and we will see her back in 4 months.        Patient Active Problem List   Diagnosis Date Noted  . DYSLIPIDEMIA 12/21/2008  . HYPERTENSION 12/21/2008  . CAD 12/21/2008  . MITRAL VALVE PROLAPSE 12/21/2008  . OSTEOPENIA 12/21/2008  . INSOMNIA 12/21/2008   Outpatient Encounter Prescriptions as of 04/22/2014  Medication Sig  . amLODipine (NORVASC) 5 MG tablet Take 1 tablet (5 mg total) by mouth daily.  Marland Kitchen aspirin 81 MG tablet Take 81 mg by mouth daily.    Marland Kitchen PREMARIN vaginal cream APPLY AS DIRECTED  . temazepam (RESTORIL) 30 MG capsule TAKE ONE CAPSULE AT BEDTIME  . traMADol (ULTRAM) 50 MG tablet TAKE ONE TABLET BY MOUTH TWICE DAILY  . [DISCONTINUED] amLODipine (NORVASC) 5 MG tablet TAKE ONE (1) TABLET EACH DAY    Review of Systems  Constitutional: Negative.   HENT: Negative.   Eyes: Negative.   Respiratory: Negative.   Cardiovascular: Negative.   Gastrointestinal: Negative.   Endocrine: Negative.   Genitourinary: Negative.   Musculoskeletal: Negative.   Skin: Negative.   Allergic/Immunologic: Negative.   Neurological: Negative.   Hematological: Negative.   Psychiatric/Behavioral: Negative.        Objective:   Physical Exam  Nursing note and vitals reviewed. Constitutional: She is oriented to person, place, and time. No distress.  The patient is thin somewhat frail and disheveled. She is alert and responded appropriately to questions asked of her  HENT:  Head: Normocephalic and atraumatic.  Nose: Nose normal.  Mouth/Throat: Oropharynx is clear and moist.  There is bilateral  ear cerumen  Eyes: Conjunctivae and EOM are normal. Pupils are equal, round, and reactive to light. Right eye exhibits no discharge. Left eye exhibits no discharge. No scleral icterus.  Neck: Normal range of motion. Neck supple. No JVD present. No thyromegaly present.  Cardiovascular: Normal rate, regular rhythm, normal heart sounds and intact distal pulses.  Exam reveals no gallop and no friction rub.   No murmur heard. At 96 per minute  Pulmonary/Chest: Effort normal and breath sounds normal. No respiratory distress. She has no wheezes. She has no rales. She exhibits no tenderness.  Abdominal: Soft. Bowel sounds are normal. She exhibits no mass. There is no tenderness. There is no rebound and no guarding.  Musculoskeletal: Normal range of motion. She exhibits no edema and no tenderness.  Lymphadenopathy:    She has no cervical adenopathy.  Neurological: She is alert and oriented to person, place, and time. She has normal reflexes. No cranial nerve deficit.  Skin: Skin is warm and dry. No rash noted.  Psychiatric: She has a normal mood and affect. Her behavior is normal. Judgment and thought content normal.    BP 146/78  Pulse 81  Temp(Src) 97.5 F (36.4 C) (Oral)  Ht 5' 2"  (1.575 m)  Wt 103 lb (46.72 kg)  BMI 18.83 kg/m2       Assessment & Plan:   1. OSTEOPENIA - POCT CBC  2. HYPERTENSION - POCT CBC - BMP8+EGFR - Hepatic function panel  3. DYSLIPIDEMIA - POCT CBC - NMR, lipoprofile  4. CAD - POCT CBC  5. Vitamin D deficiency - Vit D  25 hydroxy (rtn osteoporosis monitoring)  No orders of the defined types were placed in this encounter.   Patient Instructions                       Medicare Annual Wellness Visit  [facility] and the medical providers at [facility] strive to bring you the best medical care.  In doing so we not only want to address your current medical conditions and concerns but also to detect new conditions early and prevent  illness, disease and health-related problems.    Medicare offers a yearly Wellness Visit which allows our clinical staff to assess your need for preventative services including immunizations, lifestyle education, counseling to decrease risk of preventable diseases and screening for fall risk and other medical concerns.    This visit is provided free of charge (no copay) for all Medicare recipients. The clinical pharmacists at [facility] have begun to conduct these Wellness Visits which will also include a thorough review of all your medications.    As you primary medical provider recommend that you make an appointment for your Annual Wellness Visit if you have not done so already this year.  You may set up this appointment before you leave today or you may call back ([phone]) and schedule an appointment.  Please make sure when you call that you mention that you are scheduling your Annual Wellness Visit with the clinical pharmacist so that the appointment may be made for the proper length of time.     Continue current medications. Continue good therapeutic lifestyle changes which include good diet and exercise. Fall precautions discussed with patient. If an FOBT was given today- please return it to our front desk. If you are over 47 years old - you may need Prevnar 20 or the adult Pneumonia vaccine.  Try to get out of the home more and get more exercise Walking is the best exercise Drink plenty of fluids We will call you with the results of the lab work once those results are available We will schedule you for a visit with the cardiologist if it is determined that you need one   Arrie Senate MD

## 2014-04-23 LAB — HEPATIC FUNCTION PANEL
ALT: 6 IU/L (ref 0–32)
AST: 12 IU/L (ref 0–40)
Albumin: 4.2 g/dL (ref 3.5–4.7)
Alkaline Phosphatase: 62 IU/L (ref 39–117)
Bilirubin, Direct: 0.13 mg/dL (ref 0.00–0.40)
TOTAL PROTEIN: 6.6 g/dL (ref 6.0–8.5)
Total Bilirubin: 0.6 mg/dL (ref 0.0–1.2)

## 2014-04-23 LAB — BMP8+EGFR
BUN/Creatinine Ratio: 12 (ref 11–26)
BUN: 17 mg/dL (ref 8–27)
CALCIUM: 9.5 mg/dL (ref 8.7–10.3)
CO2: 25 mmol/L (ref 18–29)
Chloride: 101 mmol/L (ref 97–108)
Creatinine, Ser: 1.39 mg/dL — ABNORMAL HIGH (ref 0.57–1.00)
GFR calc Af Amer: 40 mL/min/{1.73_m2} — ABNORMAL LOW (ref >59)
GFR calc non Af Amer: 35 mL/min/{1.73_m2} — ABNORMAL LOW (ref >59)
Glucose: 97 mg/dL (ref 65–99)
POTASSIUM: 3.9 mmol/L (ref 3.5–5.2)
Sodium: 141 mmol/L (ref 134–144)

## 2014-04-23 LAB — VITAMIN D 25 HYDROXY (VIT D DEFICIENCY, FRACTURES): Vit D, 25-Hydroxy: 12.2 ng/mL — ABNORMAL LOW (ref 30.0–100.0)

## 2014-04-23 LAB — NMR, LIPOPROFILE
CHOLESTEROL: 268 mg/dL — AB (ref 100–199)
HDL Cholesterol by NMR: 74 mg/dL (ref >39)
HDL Particle Number: 37.5 umol/L (ref >=30.5)
LDL PARTICLE NUMBER: 2389 nmol/L — AB (ref <1000)
LDL Size: 21.7 nm (ref >20.5)
LDLC SERPL CALC-MCNC: 175 mg/dL — AB (ref 0–99)
LP-IR Score: <25 (ref <=45)
Small LDL Particle Number: 656 nmol/L — ABNORMAL HIGH (ref <=527)
Triglycerides by NMR: 94 mg/dL (ref 0–149)

## 2014-04-30 ENCOUNTER — Other Ambulatory Visit: Payer: Self-pay | Admitting: Family Medicine

## 2014-05-03 ENCOUNTER — Other Ambulatory Visit: Payer: Self-pay | Admitting: Family Medicine

## 2014-05-03 NOTE — Telephone Encounter (Signed)
This may be refilled if it is on time and not too early

## 2014-05-03 NOTE — Telephone Encounter (Signed)
Last ov 04/22/14. Last rf 04/06/14. Please print and route to pool if approved.

## 2014-05-04 NOTE — Telephone Encounter (Signed)
Patient aware.

## 2014-05-04 NOTE — Telephone Encounter (Signed)
This can be refilled as long as it is on time

## 2014-05-28 ENCOUNTER — Other Ambulatory Visit: Payer: Self-pay | Admitting: Family Medicine

## 2014-05-31 NOTE — Telephone Encounter (Signed)
Last ov 7/15. Last refill 05/03/14. If approved print and route to nurse pool.

## 2014-05-31 NOTE — Telephone Encounter (Signed)
This is okay to refill as long as it is on time

## 2014-06-01 NOTE — Telephone Encounter (Signed)
Pt notified to pick up rx.

## 2014-06-05 ENCOUNTER — Other Ambulatory Visit: Payer: Self-pay | Admitting: Family Medicine

## 2014-06-21 ENCOUNTER — Other Ambulatory Visit: Payer: Self-pay | Admitting: Family Medicine

## 2014-06-26 ENCOUNTER — Other Ambulatory Visit: Payer: Self-pay | Admitting: Family Medicine

## 2014-06-28 ENCOUNTER — Telehealth: Payer: Self-pay | Admitting: *Deleted

## 2014-06-28 NOTE — Telephone Encounter (Signed)
Pt's husband notified RX for Tramadol is ready for pick up RX to the front

## 2014-06-28 NOTE — Telephone Encounter (Signed)
This description is okay x1

## 2014-06-28 NOTE — Telephone Encounter (Signed)
Patient last seen in office on 04-22-14. Rx last filled on 05-31-14 for #60. Please advise on refill. If approved please route to Pool A so nurse can call patient to pick up. Rx will print

## 2014-06-30 NOTE — Telephone Encounter (Signed)
Patient aware to pick up 

## 2014-07-14 ENCOUNTER — Encounter: Payer: Self-pay | Admitting: Cardiology

## 2014-07-14 ENCOUNTER — Ambulatory Visit (INDEPENDENT_AMBULATORY_CARE_PROVIDER_SITE_OTHER): Payer: Medicare Other | Admitting: Cardiology

## 2014-07-14 VITALS — BP 160/80 | HR 90 | Ht 63.0 in | Wt 106.0 lb

## 2014-07-14 DIAGNOSIS — I2510 Atherosclerotic heart disease of native coronary artery without angina pectoris: Secondary | ICD-10-CM

## 2014-07-14 DIAGNOSIS — I059 Rheumatic mitral valve disease, unspecified: Secondary | ICD-10-CM

## 2014-07-14 DIAGNOSIS — I10 Essential (primary) hypertension: Secondary | ICD-10-CM

## 2014-07-14 NOTE — Patient Instructions (Signed)
The current medical regimen is effective;  continue present plan and medications.  Follow up as needed 

## 2014-07-14 NOTE — Progress Notes (Signed)
HPI The patient presents after having been gone for greater than 3 years. I saw her in 2011.  She has a history of coronary disease status post CABG. Her last stress test was apparently in 2007. She had an echocardiogram 2009. I don't have these records. She says she does well. She lives with her husband. She does some minimal chores such as making the bed. With this she denies any cardiovascular symptoms. The patient denies any new symptoms such as chest discomfort, neck or arm discomfort. There has been no new shortness of breath, PND or orthopnea. There have been no reported palpitations, presyncope or syncope.  Allergies  Allergen Reactions  . Penicillins     Current Outpatient Prescriptions  Medication Sig Dispense Refill  . amLODipine (NORVASC) 5 MG tablet Take 1 tablet (5 mg total) by mouth daily.  30 tablet  0  . aspirin 81 MG tablet Take 81 mg by mouth daily.        Marland Kitchen PREMARIN vaginal cream APPLY AS DIRECTED  30 g  2  . temazepam (RESTORIL) 30 MG capsule TAKE ONE CAPSULE AT BEDTIME  30 capsule  2  . traMADol (ULTRAM) 50 MG tablet TAKE ONE TABLET BY MOUTH TWICE DAILY  60 tablet  0   No current facility-administered medications for this visit.    Past Medical History  Diagnosis Date  . Hypertension   . Dyslipidemia   . Insomnia   . CAD (coronary artery disease)     s/p CABG in 2002  . Mitral valve prolapse   . Osteopenia   . Bladder prolapse, congenital   . Osteoporosis     Past Surgical History  Procedure Laterality Date  . Coronary artery bypass graft  2002    at [facility] of the LIMA to the LAD, SVG to obtuse marginal 1, and SVG to PDA,   . Nephrectomy      Renal cell cancer s/p  . Vesicovaginal fistula closure w/ tah    . Nasal fracture surgery    . Bladder surgery      Family History  Problem Relation Age of Onset  . Cancer Maternal Grandmother     ?    History   Social History  . Marital Status: Married    Spouse Name: N/A    Number of  Children: 3  . Years of Education: N/A   Occupational History  . retired English as a second language teacher    Social History Main Topics  . Smoking status: Never Smoker   . Smokeless tobacco: Never Used  . Alcohol Use: No  . Drug Use: No  . Sexual Activity: Not on file   Other Topics Concern  . Not on file   Social History Narrative  . No narrative on file    ROS:  Positive for constipation. Otherwise as stated in the HPI and negative for all other systems.  PHYSICAL EXAM BP 160/80  Pulse 90  Ht 5\' 3"  (1.6 m)  Wt 106 lb (48.081 kg)  BMI 18.78 kg/m2 GENERAL:  Well appearing HEENT:  Pupils equal round and reactive, fundi not visualized, oral mucosa unremarkable NECK:  No jugular venous distention, waveform within normal limits, carotid upstroke brisk and symmetric, no bruits, no thyromegaly LYMPHATICS:  No cervical, inguinal adenopathy LUNGS:  Clear to auscultation bilaterally BACK:  No CVA tenderness CHEST:  Well healed sternotomy scar. HEART:  PMI not displaced or sustained,S1 and S2 within normal limits, no S3, no S4, no clicks, no rubs, no murmurs  ABD:  Flat, positive bowel sounds normal in frequency in pitch, no bruits, no rebound, no guarding, no midline pulsatile mass, no hepatomegaly, no splenomegaly EXT:  2 plus pulses throughout, no edema, no cyanosis no clubbing SKIN:  No rashes no nodules NEURO:  Cranial nerves II through XII grossly intact, motor grossly intact throughout PSYCH:  Cognitively intact, oriented to person place and time   EKG:  Baseline artifact occluded adequate analysis, regular rhythm appears to be sinus with probable first degree AV block, left ventricular urgency with repolarization changes, change from previous.  07/14/2014  ASSESSMENT AND PLAN  CAD:   We did discuss the possibility of a stress test. However, she would very much like conservative therapy. In the absence of symptoms I think this is reasonable. We have discussed the need to presents today she  had any chest discomfort or other possible anginal symptoms.  HTN:  The blood pressure is slightly elevated but this is unusual. I reviewed previous readings. No change in therapy is indicated. I  DYSLIPIDEMIA:  LDL was 171 but she refuses statin

## 2014-07-19 ENCOUNTER — Other Ambulatory Visit: Payer: Self-pay | Admitting: Family Medicine

## 2014-07-20 ENCOUNTER — Other Ambulatory Visit: Payer: Self-pay | Admitting: *Deleted

## 2014-07-20 MED ORDER — TRAMADOL HCL 50 MG PO TABS: ORAL_TABLET | ORAL | Status: DC

## 2014-07-20 NOTE — Telephone Encounter (Signed)
Pt has requested Tramadol refill. Not due until 06/29/14. Per Aaron Edelman pt is taking more than prescribed. She is taking tid. If approved route to nurse pool to notify pt to pickup RX.

## 2014-07-20 NOTE — Telephone Encounter (Signed)
The prescription cannot be refilled until October 29 as she is supposed to only be taking 1 twice daily

## 2014-07-20 NOTE — Telephone Encounter (Signed)
Husband aware and pharmacy aware.

## 2014-07-29 ENCOUNTER — Other Ambulatory Visit: Payer: Self-pay | Admitting: Family Medicine

## 2014-08-02 ENCOUNTER — Other Ambulatory Visit: Payer: Self-pay | Admitting: *Deleted

## 2014-08-02 NOTE — Telephone Encounter (Signed)
This is okay to refill as long as it is on time

## 2014-08-02 NOTE — Telephone Encounter (Signed)
Last filled 06/29/14, last seen 04/22/14. Route to pool A, [facility]

## 2014-08-17 ENCOUNTER — Other Ambulatory Visit: Payer: Self-pay | Admitting: Family Medicine

## 2014-08-24 ENCOUNTER — Ambulatory Visit (INDEPENDENT_AMBULATORY_CARE_PROVIDER_SITE_OTHER): Payer: Medicare Other | Admitting: Family Medicine

## 2014-08-24 ENCOUNTER — Other Ambulatory Visit: Payer: Self-pay | Admitting: Family Medicine

## 2014-08-24 ENCOUNTER — Ambulatory Visit (INDEPENDENT_AMBULATORY_CARE_PROVIDER_SITE_OTHER): Payer: Medicare Other

## 2014-08-24 ENCOUNTER — Encounter: Payer: Self-pay | Admitting: Family Medicine

## 2014-08-24 ENCOUNTER — Telehealth: Payer: Self-pay

## 2014-08-24 VITALS — BP 151/79 | HR 101 | Temp 97.9°F | Ht 63.0 in | Wt 103.0 lb

## 2014-08-24 DIAGNOSIS — M545 Low back pain: Secondary | ICD-10-CM

## 2014-08-24 DIAGNOSIS — K5909 Other constipation: Secondary | ICD-10-CM

## 2014-08-24 DIAGNOSIS — R2681 Unsteadiness on feet: Secondary | ICD-10-CM

## 2014-08-24 DIAGNOSIS — I10 Essential (primary) hypertension: Secondary | ICD-10-CM

## 2014-08-24 DIAGNOSIS — E559 Vitamin D deficiency, unspecified: Secondary | ICD-10-CM

## 2014-08-24 DIAGNOSIS — I2510 Atherosclerotic heart disease of native coronary artery without angina pectoris: Secondary | ICD-10-CM

## 2014-08-24 DIAGNOSIS — M79604 Pain in right leg: Secondary | ICD-10-CM

## 2014-08-24 LAB — POCT CBC
GRANULOCYTE PERCENT: 71.8 % (ref 37–80)
HEMATOCRIT: 41.5 % (ref 37.7–47.9)
Hemoglobin: 13.2 g/dL (ref 12.2–16.2)
Lymph, poc: 2.7 (ref 0.6–3.4)
MCH: 29.8 pg (ref 27–31.2)
MCHC: 31.8 g/dL (ref 31.8–35.4)
MCV: 93.7 fL (ref 80–97)
MPV: 8.2 fL (ref 0–99.8)
POC Granulocyte: 8.5 — AB (ref 2–6.9)
POC LYMPH PERCENT: 23.2 %L (ref 10–50)
Platelet Count, POC: 309 10*3/uL (ref 142–424)
RBC: 4.4 M/uL (ref 4.04–5.48)
RDW, POC: 14.3 %
WBC: 11.8 10*3/uL — AB (ref 4.6–10.2)

## 2014-08-24 NOTE — Telephone Encounter (Signed)
-----   Message from Chipper Herb, MD sent at 08/24/2014 12:42 PM EST ----- As per radiology report----please call the son, Juanda Crumble with this information, the patient has a lot of stool in the abdomen. Make sure that she is taking something like MiraLAX regularly and drinking plenty of fluids

## 2014-08-24 NOTE — Patient Instructions (Addendum)
Medicare Annual Wellness Visit  [facility] and the medical providers at [facility] strive to bring you the best medical care.  In doing so we not only want to address your current medical conditions and concerns but also to detect new conditions early and prevent illness, disease and health-related problems.    Medicare offers a yearly Wellness Visit which allows our clinical staff to assess your need for preventative services including immunizations, lifestyle education, counseling to decrease risk of preventable diseases and screening for fall risk and other medical concerns.    This visit is provided free of charge (no copay) for all Medicare recipients. The clinical pharmacists at [facility] have begun to conduct these Wellness Visits which will also include a thorough review of all your medications.    As you primary medical provider recommend that you make an appointment for your Annual Wellness Visit if you have not done so already this year.  You may set up this appointment before you leave today or you may call back ([phone]) and schedule an appointment.  Please make sure when you call that you mention that you are scheduling your Annual Wellness Visit with the clinical pharmacist so that the appointment may be made for the proper length of time.      Continue current medications. Continue good therapeutic lifestyle changes which include good diet and exercise. Fall precautions discussed with patient. If an FOBT was given today- please return it to our front desk. If you are over 27 years old - you may need Prevnar 65 or the adult Pneumonia vaccine.  Flu Shots will be available at our office starting mid- September. Please call and schedule a FLU CLINIC APPOINTMENT.   Flip the mattress as your son recommended as this may help the back pain We will call you with the results of the x-rays once those results are  available and we will consider at that time trying Cymbalta Continue  tramadol as doing and try to take as little as possible Drink more fluids Stay as active physically in the house as possible and walk and exercise regularly Drink more fluids this is very important to keep her bowels working regularly

## 2014-08-24 NOTE — Telephone Encounter (Signed)
Son aware of X-ray results

## 2014-08-24 NOTE — Progress Notes (Signed)
Subjective:    Patient ID: Amanda Hubbard, female    DOB: 02/22/1928, 80 y.o.   MRN: [id number]  HPI Pt here for follow up and management of chronic medical problems. This patient has multiple complaints and continues to feel bad. She refuses to do flu shot Prevnar vaccine mammograms DEXA scans and pelvic exams. She also refuses to do an FOBT. She complains today of back pain radiating down the right leg. Her blood pressure is also elevated. The patient comes to the visit today with her son. She denies any falls or accidents. She also complains of constipation. She is requesting something stronger for pain other than tramadol and I informed her with her son being present that we do not want to go with stronger medicine as this would increase the risk of falling and constipation.      Patient Active Problem List   Diagnosis Date Noted  . DYSLIPIDEMIA 12/21/2008  . Essential hypertension 12/21/2008  . Coronary atherosclerosis 12/21/2008  . Mitral valve disorder 12/21/2008  . OSTEOPENIA 12/21/2008  . INSOMNIA 12/21/2008   Outpatient Encounter Prescriptions as of 08/24/2014  Medication Sig  . amLODipine (NORVASC) 5 MG tablet Take 1 tablet (5 mg total) by mouth daily.  Marland Kitchen aspirin 81 MG tablet Take 81 mg by mouth daily.    Marland Kitchen PREMARIN vaginal cream APPLY AS DIRECTED  . temazepam (RESTORIL) 30 MG capsule TAKE ONE CAPSULE AT BEDTIME  . traMADol (ULTRAM) 50 MG tablet TAKE ONE TABLET BY MOUTH TWICE DAILY    Review of Systems  HENT: Negative.   Eyes: Negative.   Respiratory: Negative.   Cardiovascular: Negative.   Gastrointestinal: Negative.   Endocrine: Negative.   Genitourinary: Negative.   Musculoskeletal: Positive for back pain. Gait problem: goes down into right leg.  Skin: Negative.   Allergic/Immunologic: Negative.   Neurological: Positive for weakness.  Hematological: Negative.   Psychiatric/Behavioral: Negative.        Objective:   Physical Exam  Constitutional: She is  oriented to person, place, and time. No distress.  The patient is thin and frail in appearance  HENT:  Head: Normocephalic.  Right Ear: External ear normal.  Left Ear: External ear normal.  Nose: Nose normal.  Mouth/Throat: No oropharyngeal exudate.  The patient has poor oral hygiene. Both ear canals have cerumen. She refuses to have them irrigated.  Eyes: Conjunctivae and EOM are normal. Pupils are equal, round, and reactive to light. Right eye exhibits no discharge. Left eye exhibits no discharge. No scleral icterus.  Neck: Normal range of motion. Neck supple. No thyromegaly present.  Cardiovascular: Normal rate, regular rhythm and normal heart sounds.  Exam reveals no gallop and no friction rub.   No murmur heard. At 72/m with a regular rate and rhythm  Pulmonary/Chest: Effort normal and breath sounds normal. No respiratory distress. She has no wheezes. She has no rales. She exhibits no tenderness.  Abdominal: Soft. Bowel sounds are normal. She exhibits no mass. There is tenderness. There is no rebound and no guarding.  Generalized tenderness without masses or organ enlargement  Musculoskeletal: Normal range of motion. She exhibits no edema or tenderness.  There is fairly good leg raising and hip abduction bilaterally with some pain with leg raising in the right leg and left thigh  Lymphadenopathy:    She has no cervical adenopathy.  Neurological: She is alert and oriented to person, place, and time. She has normal reflexes. No cranial nerve deficit.  Skin: Skin is warm and  dry. No rash noted. She is not diaphoretic.  Very dry skin.  Psychiatric: She has a normal mood and affect. Her behavior is normal. Judgment and thought content normal.  Depressed uncooperative affect  Nursing note and vitals reviewed.  BP 174/88 mmHg  Pulse 101  Temp(Src) 97.9 F (36.6 C) (Oral)  Ht 5' 3"  (1.6 m)  Wt 103 lb (46.72 kg)  BMI 18.25 kg/m2  [facility] reading (PRIMARY) by  Dr. Bobbe Medico  spine--degenerative changes and a lot of stool in the bowel                                       Assessment & Plan:  1. Vitamin D deficiency - POCT CBC - Vit D  25 hydroxy (rtn osteoporosis monitoring)  2. Unstable gait - POCT CBC  3. Essential hypertension - POCT CBC - BMP8+EGFR - Hepatic function panel - Lipid panel  4. Right leg pain - DG Lumbar Spine 2-3 Views; Future  5. Low back pain, unspecified back pain laterality, with sciatica presence unspecified - DG Lumbar Spine 2-3 Views; Future  6. ASCVD (arteriosclerotic cardiovascular disease) -The patient just saw the cardiologist and he told her to return only if needed.  Patient Instructions                       Medicare Annual Wellness Visit  [facility] and the medical providers at [facility] strive to bring you the best medical care.  In doing so we not only want to address your current medical conditions and concerns but also to detect new conditions early and prevent illness, disease and health-related problems.    Medicare offers a yearly Wellness Visit which allows our clinical staff to assess your need for preventative services including immunizations, lifestyle education, counseling to decrease risk of preventable diseases and screening for fall risk and other medical concerns.    This visit is provided free of charge (no copay) for all Medicare recipients. The clinical pharmacists at [facility] have begun to conduct these Wellness Visits which will also include a thorough review of all your medications.    As you primary medical provider recommend that you make an appointment for your Annual Wellness Visit if you have not done so already this year.  You may set up this appointment before you leave today or you may call back ([phone]) and schedule an appointment.  Please make sure when you call that you mention that you are scheduling your Annual Wellness Visit with  the clinical pharmacist so that the appointment may be made for the proper length of time.      Continue current medications. Continue good therapeutic lifestyle changes which include good diet and exercise. Fall precautions discussed with patient. If an FOBT was given today- please return it to our front desk. If you are over 47 years old - you may need Prevnar 67 or the adult Pneumonia vaccine.  Flu Shots will be available at our office starting mid- September. Please call and schedule a FLU CLINIC APPOINTMENT.   Flip the mattress as your son recommended as this may help the back pain We will call you with the results of the x-rays once those results are available and we will consider at that time trying Cymbalta Continue  tramadol as doing and try to take as little as possible Drink more  fluids Stay as active physically in the house as possible and walk and exercise regularly   drink more fluids   Arrie Senate MD

## 2014-08-24 NOTE — Telephone Encounter (Signed)
[facility] to X-ray 

## 2014-08-25 ENCOUNTER — Telehealth: Payer: Self-pay | Admitting: Family Medicine

## 2014-08-25 ENCOUNTER — Telehealth: Payer: Self-pay

## 2014-08-25 LAB — BMP8+EGFR
BUN / CREAT RATIO: 15 (ref 11–26)
BUN: 20 mg/dL (ref 8–27)
CHLORIDE: 101 mmol/L (ref 97–108)
CO2: 23 mmol/L (ref 18–29)
CREATININE: 1.31 mg/dL — AB (ref 0.57–1.00)
Calcium: 9.5 mg/dL (ref 8.7–10.3)
GFR calc non Af Amer: 37 mL/min/{1.73_m2} — ABNORMAL LOW (ref >59)
GFR, EST AFRICAN AMERICAN: 43 mL/min/{1.73_m2} — AB (ref >59)
Glucose: 99 mg/dL (ref 65–99)
Potassium: 4.1 mmol/L (ref 3.5–5.2)
Sodium: 143 mmol/L (ref 134–144)

## 2014-08-25 LAB — HEPATIC FUNCTION PANEL
ALK PHOS: 68 IU/L (ref 39–117)
ALT: <5 IU/L (ref 0–32)
AST: 13 IU/L (ref 0–40)
Albumin: 4.2 g/dL (ref 3.5–4.7)
BILIRUBIN TOTAL: 0.8 mg/dL (ref 0.0–1.2)
Bilirubin, Direct: 0.16 mg/dL (ref 0.00–0.40)
Total Protein: 6.9 g/dL (ref 6.0–8.5)

## 2014-08-25 LAB — LIPID PANEL
CHOL/HDL RATIO: 4 ratio (ref 0.0–4.4)
Cholesterol, Total: 283 mg/dL — ABNORMAL HIGH (ref 100–199)
HDL: 70 mg/dL (ref >39)
LDL Calculated: 185 mg/dL — ABNORMAL HIGH (ref 0–99)
Triglycerides: 142 mg/dL (ref 0–149)
VLDL Cholesterol Cal: 28 mg/dL (ref 5–40)

## 2014-08-25 LAB — VITAMIN D 25 HYDROXY (VIT D DEFICIENCY, FRACTURES): VIT D 25 HYDROXY: 9 ng/mL — AB (ref 30.0–100.0)

## 2014-08-25 MED ORDER — TRAMADOL HCL 50 MG PO TABS: 50.0000 mg | ORAL_TABLET | Freq: Two times a day (BID) | ORAL | Status: DC

## 2014-08-25 MED ORDER — DULOXETINE HCL 30 MG PO CPEP: 30.0000 mg | ORAL_CAPSULE | Freq: Every day | ORAL | Status: DC

## 2014-08-25 NOTE — Telephone Encounter (Signed)
Cymbalta 30 mg once daily at bedtime, #15 with refills for #30. Please make sure that she takes this with food.

## 2014-08-25 NOTE — Addendum Note (Signed)
Addended by: Zannie Cove on: 08/25/2014 12:52 PM   Modules accepted: Orders

## 2014-08-25 NOTE — Telephone Encounter (Signed)
Son Amanda Hubbard aware of lab results and will check on Vit D3

## 2014-08-25 NOTE — Telephone Encounter (Signed)
-----   Message from Chipper Herb, MD sent at 08/25/2014  7:32 AM EST ----- Please call the patient's son, Amanda Hubbard with the results of this lab work The blood sugar is good at 99. The creatinine, the most important kidney function test remains elevated but is stable compared to past readings. The electrolytes including potassium are within normal limits All liver function tests are within normal limits Cholesterol numbers with advanced lipid testing remain elevated. The LDL C is 185 and should be less than 75.------- the patient has refused to take statin drugs in the past and therefore must continued with as aggressive therapeutic lifestyle changes as possible which include diet and exercise. The vitamin D level remains very low.-----Please make sure that she is taking at least vitamin D3 1000 daily++++++++++

## 2014-08-25 NOTE — Telephone Encounter (Signed)
Pt was seen yesterday, did you want her to try Cymbalta? Please advise.

## 2014-08-25 NOTE — Telephone Encounter (Signed)
This is okay to refill as long as it is on time

## 2014-08-25 NOTE — Telephone Encounter (Signed)
Done

## 2014-09-03 ENCOUNTER — Other Ambulatory Visit: Payer: Self-pay | Admitting: Family Medicine

## 2014-09-07 ENCOUNTER — Telehealth: Payer: Self-pay | Admitting: *Deleted

## 2014-09-07 NOTE — Telephone Encounter (Signed)
Restoril 30 mg qty 30 with 1 refill called to [facility] per [facility].

## 2014-09-29 ENCOUNTER — Other Ambulatory Visit: Payer: Self-pay

## 2014-09-29 NOTE — Telephone Encounter (Signed)
Last seen 08/24/14 DWM  Last filled 08/25/14  If approved print and route to nurse

## 2014-09-30 MED ORDER — TRAMADOL HCL 50 MG PO TABS: 50.0000 mg | ORAL_TABLET | Freq: Two times a day (BID) | ORAL | Status: DC

## 2014-10-04 ENCOUNTER — Other Ambulatory Visit: Payer: Self-pay | Admitting: *Deleted

## 2014-10-04 MED ORDER — TEMAZEPAM 30 MG PO CAPS: 30.0000 mg | ORAL_CAPSULE | Freq: Every day | ORAL | Status: DC

## 2014-10-04 NOTE — Telephone Encounter (Signed)
Last filled 09/07/14, last seen 08/24/14. [facility]

## 2014-10-30 ENCOUNTER — Other Ambulatory Visit: Payer: Self-pay | Admitting: Family Medicine

## 2014-11-01 NOTE — Telephone Encounter (Signed)
Last seen 08/24/14, last filled 10/04/14. Rx will print

## 2014-11-04 ENCOUNTER — Other Ambulatory Visit: Payer: Self-pay | Admitting: *Deleted

## 2014-11-04 MED ORDER — TEMAZEPAM 30 MG PO CAPS: 30.0000 mg | ORAL_CAPSULE | Freq: Every day | ORAL | Status: DC

## 2014-11-29 ENCOUNTER — Other Ambulatory Visit: Payer: Self-pay | Admitting: Family Medicine

## 2014-11-30 NOTE — Telephone Encounter (Signed)
Last seen 08/24/14 DWM  If approved print and route to nurse

## 2015-01-10 ENCOUNTER — Telehealth: Payer: Self-pay | Admitting: *Deleted

## 2015-01-10 NOTE — Telephone Encounter (Signed)
Pt walked into office today stating she was in horrible lower back pain and lower abdominal pain. Pt went [facility] this past weekend and was given a Morphine shot and rx for Meloxicam, pt is requesting the strongest pain medication possible so she can get some rest. Pt given appt with you tomorrow at 3 to follow up from the hospital visit, pt states they did an MRI and she was told she has degenerative disk disease. Please advise.

## 2015-01-10 NOTE — Telephone Encounter (Signed)
Per husband - patient is taking 2 tramadol per day. Need more records - from ER and MRI results.  Need more information and office visit with physical assessment.  What has started the increased pain.   I did tell patient's husband she could take tramadol up to QID until her appt tomorrow.   Other possible meds for pain to consider -  Butrans patches (though this might need PA) Hydrocodone/APAP 5/325mg  1 tablet every 6 to 8 hours as needed.  Also gabapentin is a possibility if pain is though to be neuropathic.

## 2015-01-10 NOTE — Telephone Encounter (Signed)
Thanks for recommendations she has had chronic and severe pain for many years and I am hesitant to increase her more because I know we will never be able to go back to the previous dosage.

## 2015-01-10 NOTE — Telephone Encounter (Signed)
Pt was didn't appear to be in a great deal of pain. She was able to ambulate in and out of the building without assistance. She was able to sit on and jump down off of the procedure table without difficulty, pt states she would just go back to the ER to get pain meds, husband was able to get the pt to agree to wait until we were able to get back to them.

## 2015-01-10 NOTE — Telephone Encounter (Signed)
Please confirm with patient how much tramadol she is currently taking Also have Tammy review any other pain management options for this patient

## 2015-01-11 ENCOUNTER — Encounter: Payer: Self-pay | Admitting: Family Medicine

## 2015-01-11 ENCOUNTER — Ambulatory Visit (INDEPENDENT_AMBULATORY_CARE_PROVIDER_SITE_OTHER): Payer: Medicare Other | Admitting: Family Medicine

## 2015-01-11 VITALS — BP 149/91 | HR 104 | Temp 97.6°F | Ht 63.0 in | Wt 108.0 lb

## 2015-01-11 DIAGNOSIS — Z905 Acquired absence of kidney: Secondary | ICD-10-CM | POA: Diagnosis not present

## 2015-01-11 DIAGNOSIS — R1031 Right lower quadrant pain: Secondary | ICD-10-CM

## 2015-01-11 DIAGNOSIS — R54 Age-related physical debility: Secondary | ICD-10-CM

## 2015-01-11 DIAGNOSIS — M5441 Lumbago with sciatica, right side: Secondary | ICD-10-CM | POA: Diagnosis not present

## 2015-01-11 MED ORDER — MELOXICAM 7.5 MG PO TABS: 7.5000 mg | ORAL_TABLET | Freq: Every day | ORAL | Status: DC

## 2015-01-11 MED ORDER — DULOXETINE HCL 30 MG PO CPEP: 30.0000 mg | ORAL_CAPSULE | Freq: Every day | ORAL | Status: DC

## 2015-01-11 MED ORDER — TRAMADOL HCL 50 MG PO TABS: 50.0000 mg | ORAL_TABLET | Freq: Four times a day (QID) | ORAL | Status: DC | PRN

## 2015-01-11 NOTE — Progress Notes (Signed)
Subjective:    Patient ID: Amanda Hubbard, female    DOB: January 03, 1928, 81 y.o.   MRN: [id number]  HPI Patient here today for right side pain that radiates to right hip and leg. This is an on-goin issue per the patient, but recently hurting more. Today she rates her pain at a 7-8 out of 10.  Let him come back with her. Her husband brings her in today, but she refuses to. I did speak with her husband before I went in the room. The note from the [facility] ER was reviewed before going into the room with the patient and she does have degenerative disc disease of the lumbar spine and she does have gallstones but these have been there since 2007 and there's been no change with that. It does show where she had the left nephrectomy. But there were no findings other than degenerative disc problems in her spine that could be related to the side pain that she was having. The CBC also had a slightly elevated white blood cell count and a good hemoglobin at 12.7. The platelet  count was adequate. These records will be scanned into the chart.       Patient Active Problem List   Diagnosis Date Noted  . DYSLIPIDEMIA 12/21/2008  . Essential hypertension 12/21/2008  . Coronary atherosclerosis 12/21/2008  . Mitral valve disorder 12/21/2008  . OSTEOPENIA 12/21/2008  . INSOMNIA 12/21/2008   Outpatient Encounter Prescriptions as of 01/11/2015  Medication Sig  . amLODipine (NORVASC) 5 MG tablet TAKE ONE (1) TABLET EACH DAY  . aspirin 81 MG tablet Take 81 mg by mouth daily.    Marland Kitchen PREMARIN vaginal cream APPLY AS DIRECTED  . temazepam (RESTORIL) 30 MG capsule Take 1 capsule (30 mg total) by mouth at bedtime.  . traMADol (ULTRAM) 50 MG tablet TAKE ONE TABLET BY MOUTH TWICE DAILY  . [DISCONTINUED] DULoxetine (CYMBALTA) 30 MG capsule Take 1 capsule (30 mg total) by mouth daily.     Review of Systems  Constitutional: Negative.   HENT: Negative.   Eyes: Negative.   Respiratory: Negative.     Cardiovascular: Negative.   Gastrointestinal: Negative.   Endocrine: Negative.   Genitourinary: Negative.   Musculoskeletal: Positive for back pain and arthralgias (right side, right hip and leg).  Skin: Negative.   Allergic/Immunologic: Negative.   Neurological: Negative.   Hematological: Negative.   Psychiatric/Behavioral: Negative.        Objective:   Physical Exam  Constitutional: She is oriented to person, place, and time. No distress.  The patient is alert and somewhat frail and elderly appearing but appears in no acute distress.  HENT:  Head: Normocephalic and atraumatic.  Eyes: Conjunctivae and EOM are normal. Pupils are equal, round, and reactive to light. Right eye exhibits no discharge. Left eye exhibits no discharge. No scleral icterus.  Neck: Normal range of motion. Neck supple. No thyromegaly present.  Cardiovascular: Normal rate and normal heart sounds.   No murmur heard. The heart rate is elevated and somewhat irregular at 10 8/m  Pulmonary/Chest: Effort normal and breath sounds normal. No respiratory distress. She has no wheezes. She has no rales. She exhibits no tenderness.  Abdominal: Soft. Bowel sounds are normal. She exhibits no mass. There is no tenderness. There is no rebound and no guarding.  The abdomen was minimally tender to palpation and there were no masses.  Musculoskeletal: Normal range of motion. She exhibits no edema.  Lymphadenopathy:    She has no  cervical adenopathy.  Neurological: She is alert and oriented to person, place, and time.  Skin: Skin is warm and dry. No rash noted. No erythema. No pallor.  Psychiatric: She has a normal mood and affect. Her behavior is normal. Judgment and thought content normal.  Nursing note and vitals reviewed.  BP 149/91 mmHg  Pulse 104  Temp(Src) 97.6 F (36.4 C) (Oral)  Ht 5\' 3"  (1.6 m)  Wt 108 lb (48.988 kg)  BMI 19.14 kg/m2        Assessment & Plan:  1. Right lower quadrant abdominal pain -The  CT scan he recently had did not show any abdominal problems other than gallstones which have been stable for years and a history of the right nephrectomy.  2. Right-sided low back pain with right-sided sciatica -There is some degenerative arthritis in the back and this could explain some of the right-sided pain that you're having and this is why we are asking you to take half of a block sick and that you have at home daily for 7 days after eating and then no more of this medicine -We will also let you take an increased tramadol to 1    4 times daily if needed for the next couple weeks. -We will also ask you to take Cymbalta 30 mg 1 daily with food in the evening as this can help depression and chronic pain  3. History of nephrectomy  4. Frailty -The patient should continue to be careful and not put herself at risk for falling  Meds ordered this encounter  Medications  . meloxicam (MOBIC) 7.5 MG tablet    Sig: Take 1 tablet (7.5 mg total) by mouth daily.    Dispense:  30 tablet    Refill:  0  . DULoxetine (CYMBALTA) 30 MG capsule    Sig: Take 1 capsule (30 mg total) by mouth daily.    Dispense:  30 capsule    Refill:  3  . traMADol (ULTRAM) 50 MG tablet    Sig: Take 1 tablet (50 mg total) by mouth every 6 (six) hours as needed.    Dispense:  60 tablet    Refill:  0   Patient Instructions  The patient's right-sided pain is probably multifactorial The pain is probably secondary to osteoarthritis and neuropathy, scar tissue from previous surgery, and possibly cholelithiasis. She should continue to drink plenty of fluids She can increase the all TRAM to one 4 times daily if needed We will restart the Cymbalta 30 mg daily at bedtime Meloxicam 15 mg was given from the ER at [facility] and she will be asked to take a half of one of these daily for 7 days after eating She will be asked to come back and see the clinical pharmacists and a couple weeks to see what kind of progress she is  making with her right-sided pain. She should drink plenty of fluids and be active around the house but avoid lifting pushing pulling etc.   Arrie Senate MD

## 2015-01-11 NOTE — Patient Instructions (Signed)
The patient's right-sided pain is probably multifactorial The pain is probably secondary to osteoarthritis and neuropathy, scar tissue from previous surgery, and possibly cholelithiasis. She should continue to drink plenty of fluids She can increase the all TRAM to one 4 times daily if needed We will restart the Cymbalta 30 mg daily at bedtime Meloxicam 15 mg was given from the ER at [facility] and she will be asked to take a half of one of these daily for 7 days after eating She will be asked to come back and see the clinical pharmacists and a couple weeks to see what kind of progress she is making with her right-sided pain. She should drink plenty of fluids and be active around the house but avoid lifting pushing pulling etc.

## 2015-01-24 ENCOUNTER — Encounter: Payer: Self-pay | Admitting: Pharmacist

## 2015-01-24 ENCOUNTER — Telehealth: Payer: Self-pay | Admitting: Pharmacist

## 2015-01-24 ENCOUNTER — Ambulatory Visit (INDEPENDENT_AMBULATORY_CARE_PROVIDER_SITE_OTHER): Payer: Medicare Other | Admitting: Pharmacist

## 2015-01-24 VITALS — BP 140/82 | HR 74 | Wt 104.0 lb

## 2015-01-24 DIAGNOSIS — M545 Low back pain, unspecified: Secondary | ICD-10-CM

## 2015-01-24 DIAGNOSIS — R1031 Right lower quadrant pain: Secondary | ICD-10-CM

## 2015-01-24 DIAGNOSIS — M79601 Pain in right arm: Secondary | ICD-10-CM | POA: Diagnosis not present

## 2015-01-24 DIAGNOSIS — M79604 Pain in right leg: Secondary | ICD-10-CM | POA: Diagnosis not present

## 2015-01-24 DIAGNOSIS — G8929 Other chronic pain: Secondary | ICD-10-CM | POA: Diagnosis not present

## 2015-01-24 MED ORDER — LIDOCAINE 5 % EX PTCH: MEDICATED_PATCH | CUTANEOUS | Status: DC

## 2015-01-24 NOTE — Progress Notes (Signed)
Subjective:    Amanda Hubbard is a 81 y.o. female who presents for evaluation of pain. She is referred by Dr Redge Gainer.   She states that she has had chronic pain that has worsened over the last 4 to 5 months.   She is currently taking tramdol 50mg  1 tablet bid (she was told that she could increase to every 6 hours at last visit but has not increased yet.  She was also started on Cymbalta 30mg  1 daily a last visit.   Patient has had nephrectomy on the right side, heart valve surgery and also bladder surgery for which she feels all are contributing to her abdominal pain.  Records reviewed, and patient examined. I was unable to view record from her last hospital visit at [facility] which included an MRI of her back.  These records have been sent for batch scanning.   Patient has had several surgeries  Petra Kuba of the pain:  Pain is chronic.  Describes a tightness, numbness and tingling.  It is a constant pain . Location of the pain: located mostly in right abdomen and right lower back / hip.  It radiates to her right leg.   Intensity: 8/10 today Exacerbating/relieving factors: sitting up and movement make worse.  Heathing pad and lying down helps with pain  Patient reports that she stays in bed most the day because she has worse pain when she is sitting.   Adverse effects of medications: patient tried meloxicam but this caused nausea.    The following portions of the patient's history were reviewed and updated as appropriate: allergies, current medications, past family history, past medical history, past social history, past surgical history and problem list.   Objective:    Radiographs: none    Assessment:    Assessment: chronic abdominal, back and leg pain located on right side. .    Underweight  Plan:     1.  Start lidocaine patches - apply to area of pain for 12 hours then remove for 12 hours.  2.  Increase tramadol 50mg  1 tablet 4 times a day.  3.  COntinue cymbalta  30mg  1 tablet daily and continue heat therapy as needed 4.  Suggested Boost for weight but patient declined because she doesn't like the taste. Then suggested that she try Carnation instant breakfast.  5.  RTC in 1 month.   Cherre Robins, PharmD, CPP

## 2015-01-24 NOTE — Telephone Encounter (Signed)
Ok - appt changed to 2pm

## 2015-01-24 NOTE — Patient Instructions (Signed)
Look for Carnation instant breakfast powder or Boost to help increase weight / improve nutrition.    Increase non-starchy vegetables - carrots, green bean, squash, zucchini, tomatoes, onions, peppers, spinach and other green leafy vegetables, cabbage, lettuce, cucumbers, asparagus, okra (not fried), eggplant limit sugar and processed foods (cakes, cookies, ice cream, crackers and chips) Increase fresh fruit but limit serving sizes 1/2 cup or about the size of tennis or baseball limit red meat to no more than 1-2 times per week (serving size about the size of your palm) Choose whole grains / lean proteins - whole wheat bread, quinoa, whole grain rice (1/2 cup), fish, chicken, [place]

## 2015-01-25 ENCOUNTER — Telehealth: Payer: Self-pay | Admitting: Pharmacist

## 2015-01-26 NOTE — Telephone Encounter (Signed)
Called patient - states that increase in tramadol dose is helping with pain.  She is to let me know if she wants me to try to get PA for lidoderm patches.

## 2015-02-01 ENCOUNTER — Telehealth: Payer: Self-pay

## 2015-02-01 NOTE — Telephone Encounter (Signed)
Insurance denied prior authorization for Lidocaine patch due to her dx.

## 2015-02-07 ENCOUNTER — Other Ambulatory Visit: Payer: Self-pay | Admitting: Family Medicine

## 2015-02-08 NOTE — Telephone Encounter (Signed)
Last seen 01/11/15 DWM  If approved print

## 2015-02-08 NOTE — Telephone Encounter (Signed)
This is okay to refill if it is on time

## 2015-02-22 ENCOUNTER — Ambulatory Visit (INDEPENDENT_AMBULATORY_CARE_PROVIDER_SITE_OTHER): Payer: Medicare Other | Admitting: Family Medicine

## 2015-02-22 ENCOUNTER — Encounter: Payer: Self-pay | Admitting: Family Medicine

## 2015-02-22 VITALS — BP 157/84 | HR 89 | Temp 97.4°F | Ht 63.0 in | Wt 105.0 lb

## 2015-02-22 DIAGNOSIS — R54 Age-related physical debility: Secondary | ICD-10-CM

## 2015-02-22 DIAGNOSIS — R2681 Unsteadiness on feet: Secondary | ICD-10-CM | POA: Diagnosis not present

## 2015-02-22 DIAGNOSIS — I2510 Atherosclerotic heart disease of native coronary artery without angina pectoris: Secondary | ICD-10-CM | POA: Diagnosis not present

## 2015-02-22 DIAGNOSIS — E559 Vitamin D deficiency, unspecified: Secondary | ICD-10-CM

## 2015-02-22 DIAGNOSIS — I10 Essential (primary) hypertension: Secondary | ICD-10-CM

## 2015-02-22 DIAGNOSIS — D692 Other nonthrombocytopenic purpura: Secondary | ICD-10-CM

## 2015-02-22 DIAGNOSIS — L819 Disorder of pigmentation, unspecified: Secondary | ICD-10-CM

## 2015-02-22 DIAGNOSIS — K5900 Constipation, unspecified: Secondary | ICD-10-CM | POA: Diagnosis not present

## 2015-02-22 LAB — POCT CBC
Granulocyte percent: 74.7 %G (ref 37–80)
HCT, POC: 42 % (ref 37.7–47.9)
Hemoglobin: 13.3 g/dL (ref 12.2–16.2)
Lymph, poc: 2.7 (ref 0.6–3.4)
MCH, POC: 29.4 pg (ref 27–31.2)
MCHC: 31.6 g/dL — AB (ref 31.8–35.4)
MCV: 92.8 fL (ref 80–97)
MPV: 8.4 fL (ref 0–99.8)
POC Granulocyte: 9.1 — AB (ref 2–6.9)
POC LYMPH PERCENT: 22.2 %L (ref 10–50)
Platelet Count, POC: 284 10*3/uL (ref 142–424)
RBC: 4.53 M/uL (ref 4.04–5.48)
RDW, POC: 14.8 %
WBC: 12.2 10*3/uL — AB (ref 4.6–10.2)

## 2015-02-22 NOTE — Progress Notes (Signed)
Subjective:    Patient ID: Amanda Hubbard, female    DOB: 12-02-1927, 81 y.o.   MRN: [id number]  HPI  Pt here for follow up and management of chronic medical problems which includes hypertension and hyperlipidemia. She is taking medications regularly. The patient comes today for a routine follow-up. She continues to have chronic abdominal pain and this is been worked up on many occasions in the past with negative findings. She also continues to refuse to do anything as far as health maintenance is concerned and prefers and refuses to follow through with any suggestions made by the healthcare provider. She will get lab work today and as mentioned she refuses Pap smears mammograms DEXA scans and fecal occult blood test. She also refuses to take the flu shot Prevnar vaccine. The patient does complain of some constipation. She does not drink a lot of fluids.       Patient Active Problem List   Diagnosis Date Noted  . DYSLIPIDEMIA 12/21/2008  . Essential hypertension 12/21/2008  . Coronary atherosclerosis 12/21/2008  . Mitral valve disorder 12/21/2008  . OSTEOPENIA 12/21/2008  . INSOMNIA 12/21/2008   Outpatient Encounter Prescriptions as of 02/22/2015  Medication Sig  . amLODipine (NORVASC) 5 MG tablet TAKE ONE (1) TABLET EACH DAY  . aspirin 81 MG tablet Take 81 mg by mouth daily.    . DULoxetine (CYMBALTA) 30 MG capsule Take 1 capsule (30 mg total) by mouth daily.  Marland Kitchen lidocaine (LIDODERM) 5 % Apply 1 patch to area of pain.  Wear for 12 hours then remove & discard patch.  May reapply after 12 hours.  . meloxicam (MOBIC) 7.5 MG tablet Take 1 tablet (7.5 mg total) by mouth daily.  Marland Kitchen PREMARIN vaginal cream APPLY AS DIRECTED  . temazepam (RESTORIL) 30 MG capsule Take 1 capsule (30 mg total) by mouth at bedtime.  . traMADol (ULTRAM) 50 MG tablet TAKE ONE TABLET BY MOUTH TWICE DAILY   No facility-administered encounter medications on file as of 02/22/2015.     Review of Systems    Constitutional: Negative.   HENT: Negative.   Eyes: Negative.   Respiratory: Negative.   Cardiovascular: Negative.   Gastrointestinal: Positive for abdominal pain (chronic).  Endocrine: Negative.   Genitourinary: Negative.   Musculoskeletal: Negative.   Skin: Negative.   Allergic/Immunologic: Negative.   Neurological: Negative.   Hematological: Negative.   Psychiatric/Behavioral: Negative.        Objective:   Physical Exam  Constitutional: She is oriented to person, place, and time. No distress.  Then frail and impatient  HENT:  Head: Normocephalic and atraumatic.  Right Ear: External ear normal.  Left Ear: External ear normal.  Nose: Nose normal.  Mouth/Throat: Oropharynx is clear and moist.  Eyes: Conjunctivae and EOM are normal. Pupils are equal, round, and reactive to light. Right eye exhibits no discharge. Left eye exhibits no discharge. No scleral icterus.  Neck: Normal range of motion. Neck supple. No thyromegaly present.  Cardiovascular: Normal rate, regular rhythm, normal heart sounds and intact distal pulses.   No murmur heard. At 84/m  Pulmonary/Chest: Effort normal and breath sounds normal. No respiratory distress. She has no wheezes. She has no rales. She exhibits no tenderness.  Abdominal: Soft. Bowel sounds are normal. She exhibits no mass. There is no tenderness. There is no rebound and no guarding.  Slight right lower quadrant tenderness without masses or organ enlargement  Musculoskeletal: Normal range of motion. She exhibits no edema or tenderness.  Gait seems  to be improved today although she is still somewhat wobbly with getting up on the table and getting down without assistance  Lymphadenopathy:    She has no cervical adenopathy.  Neurological: She is alert and oriented to person, place, and time. She has normal reflexes. No cranial nerve deficit.  Skin: Skin is warm and dry. No rash noted.  Atypical skin lesion left temple and patient will be  referred to [facility] dermatology for removal of this  Psychiatric: She has a normal mood and affect. Her behavior is normal. Judgment and thought content normal.  Nursing note and vitals reviewed.  BP 157/84 mmHg  Pulse 89  Temp(Src) 97.4 F (36.3 C) (Oral)  Ht _0  (1.6 m)  Wt 105 lb (47.628 kg)  BMI 18.60 kg/m2        Assessment & Plan:  1. Vitamin D deficiency -Treatment of this will be determined by results of lab work being done today - POCT CBC - Vit D  25 hydroxy (rtn osteoporosis monitoring)  2. Frailty -The patient continues to remain somewhat frail and older appearing than her stated age and we'll need to be watched closely so that she doesn't fall - POCT CBC - BMP8+EGFR - Hepatic function panel  3. Essential hypertension -The blood pressure is somewhat elevated today but noted changes will be made in treatment for the concern of an increased risk of falling - POCT CBC - BMP8+EGFR - Hepatic function panel  4. ASCVD (arteriosclerotic cardiovascular disease) -She has seen the cardiologist in the past but refuses treatment recommendations and he no longer sees her - POCT CBC - BMP8+EGFR - Hepatic function panel - Lipid panel  5. Unstable gait -She continues to need to be careful to not move too fast to prevent her from falling and she should use a cane or walker if necessary - POCT CBC  6. Constipation, unspecified constipation type -She needs to drink more fluids and take less pain medication  7. Atypical pigmented skin lesion -The patient was encouraged to get this lesion removed as it would most likely get bigger and be more difficult to remove when that happens. We will discuss with her husband how to get to the location of the dermatologist to have this removed - Ambulatory referral to Dermatology  8. Senile purpura  No orders of the defined types were placed in this encounter.   Patient Instructions                       Medicare Annual  Wellness Visit  [facility] and the medical providers at [facility] strive to bring you the best medical care.  In doing so we not only want to address your current medical conditions and concerns but also to detect new conditions early and prevent illness, disease and health-related problems.    Medicare offers a yearly Wellness Visit which allows our clinical staff to assess your need for preventative services including immunizations, lifestyle education, counseling to decrease risk of preventable diseases and screening for fall risk and other medical concerns.    This visit is provided free of charge (no copay) for all Medicare recipients. The clinical pharmacists at [facility] have begun to conduct these Wellness Visits which will also include a thorough review of all your medications.    As you primary medical provider recommend that you make an appointment for your Annual Wellness Visit if you have not done so already this year.  You may set up this appointment before you leave today or you may call back ([phone]) and schedule an appointment.  Please make sure when you call that you mention that you are scheduling your Annual Wellness Visit with the clinical pharmacist so that the appointment may be made for the proper length of time.     Continue current medications. Continue good therapeutic lifestyle changes which include good diet and exercise. Fall precautions discussed with patient. If an FOBT was given today- please return it to our front desk. If you are over 74 years old - you may need Prevnar 69 or the adult Pneumonia vaccine.  Flu Shots are still available at our office. If you still haven't had one please call to set up a nurse visit to get one.   After your visit with [place] today you will receive a survey in the mail or online from [employer] regarding your care with [place]. Please take a moment to fill this out. Your feedback is very  important to [place] as you can help [place] better understand your patient needs as well as improve your experience and satisfaction. WE CARE ABOUT YOU!!!   The patient has a probable skin cancer on her left temple area-----we will arrange for a visit with [facility] dermatology which is off of [address] in [place] just past [employer] and a former [employer], take a left after the [facility] [employer] Try to drink more fluids as this will help her constipation and help your abdominal pain Continue to watch diet closely and exercise as much as possible   Arrie Senate MD

## 2015-02-22 NOTE — Patient Instructions (Addendum)
Medicare Annual Wellness Visit  [facility] and the medical providers at [facility] strive to bring you the best medical care.  In doing so we not only want to address your current medical conditions and concerns but also to detect new conditions early and prevent illness, disease and health-related problems.    Medicare offers a yearly Wellness Visit which allows our clinical staff to assess your need for preventative services including immunizations, lifestyle education, counseling to decrease risk of preventable diseases and screening for fall risk and other medical concerns.    This visit is provided free of charge (no copay) for all Medicare recipients. The clinical pharmacists at [facility] have begun to conduct these Wellness Visits which will also include a thorough review of all your medications.    As you primary medical provider recommend that you make an appointment for your Annual Wellness Visit if you have not done so already this year.  You may set up this appointment before you leave today or you may call back ([phone]) and schedule an appointment.  Please make sure when you call that you mention that you are scheduling your Annual Wellness Visit with the clinical pharmacist so that the appointment may be made for the proper length of time.     Continue current medications. Continue good therapeutic lifestyle changes which include good diet and exercise. Fall precautions discussed with patient. If an FOBT was given today- please return it to our front desk. If you are over 61 years old - you may need Prevnar 24 or the adult Pneumonia vaccine.  Flu Shots are still available at our office. If you still haven't had one please call to set up a nurse visit to get one.   After your visit with [place] today you will receive a survey in the mail or online from [employer] regarding your care with [place]. Please take a moment to  fill this out. Your feedback is very important to [place] as you can help [place] better understand your patient needs as well as improve your experience and satisfaction. WE CARE ABOUT YOU!!!   The patient has a probable skin cancer on her left temple area-----we will arrange for a visit with [facility] dermatology which is off of [address] in [place] just past [employer] and a former [employer], take a left after the [facility] [employer] Try to drink more fluids as this will help her constipation and help your abdominal pain Continue to watch diet closely and exercise as much as possible

## 2015-02-23 LAB — BMP8+EGFR
BUN / CREAT RATIO: 15 (ref 11–26)
BUN: 18 mg/dL (ref 8–27)
CO2: 24 mmol/L (ref 18–29)
Calcium: 9.5 mg/dL (ref 8.7–10.3)
Chloride: 102 mmol/L (ref 97–108)
Creatinine, Ser: 1.19 mg/dL — ABNORMAL HIGH (ref 0.57–1.00)
GFR calc Af Amer: 48 mL/min/{1.73_m2} — ABNORMAL LOW (ref >59)
GFR, EST NON AFRICAN AMERICAN: 42 mL/min/{1.73_m2} — AB (ref >59)
GLUCOSE: 101 mg/dL — AB (ref 65–99)
Potassium: 4.2 mmol/L (ref 3.5–5.2)
Sodium: 142 mmol/L (ref 134–144)

## 2015-02-23 LAB — HEPATIC FUNCTION PANEL
ALT: 10 IU/L (ref 0–32)
AST: 13 IU/L (ref 0–40)
Albumin: 4.3 g/dL (ref 3.5–4.7)
Alkaline Phosphatase: 65 IU/L (ref 39–117)
BILIRUBIN TOTAL: 0.6 mg/dL (ref 0.0–1.2)
Bilirubin, Direct: 0.15 mg/dL (ref 0.00–0.40)
Total Protein: 6.9 g/dL (ref 6.0–8.5)

## 2015-02-23 LAB — VITAMIN D 25 HYDROXY (VIT D DEFICIENCY, FRACTURES): Vit D, 25-Hydroxy: 9.5 ng/mL — ABNORMAL LOW (ref 30.0–100.0)

## 2015-02-23 LAB — LIPID PANEL
CHOLESTEROL TOTAL: 262 mg/dL — AB (ref 100–199)
Chol/HDL Ratio: 4.3 ratio units (ref 0.0–4.4)
HDL: 61 mg/dL (ref >39)
LDL Calculated: 179 mg/dL — ABNORMAL HIGH (ref 0–99)
TRIGLYCERIDES: 112 mg/dL (ref 0–149)
VLDL Cholesterol Cal: 22 mg/dL (ref 5–40)

## 2015-02-25 ENCOUNTER — Other Ambulatory Visit: Payer: Self-pay | Admitting: Family Medicine

## 2015-02-25 ENCOUNTER — Other Ambulatory Visit: Payer: Self-pay | Admitting: *Deleted

## 2015-02-25 NOTE — Telephone Encounter (Signed)
Last seen 02/22/15 DWM

## 2015-03-02 ENCOUNTER — Telehealth: Payer: Self-pay | Admitting: Family Medicine

## 2015-03-03 ENCOUNTER — Other Ambulatory Visit: Payer: Self-pay | Admitting: Family Medicine

## 2015-03-19 ENCOUNTER — Other Ambulatory Visit: Payer: Self-pay | Admitting: Family Medicine

## 2015-03-21 NOTE — Telephone Encounter (Signed)
This is okay to refill 

## 2015-03-21 NOTE — Telephone Encounter (Signed)
Last seen 02/22/15 DWM  If approved print

## 2015-05-23 ENCOUNTER — Other Ambulatory Visit: Payer: Self-pay | Admitting: Family Medicine

## 2015-05-23 NOTE — Telephone Encounter (Signed)
Last filled 04/22/15, last seen 02/22/15. [facility] at [facility]

## 2015-07-12 ENCOUNTER — Other Ambulatory Visit: Payer: Self-pay | Admitting: Family Medicine

## 2015-07-12 NOTE — Telephone Encounter (Signed)
This is okay to refill 1 

## 2015-07-12 NOTE — Telephone Encounter (Signed)
Last seen 02/22/15 DWM  If approved print 

## 2015-07-19 ENCOUNTER — Encounter: Payer: Self-pay | Admitting: Family Medicine

## 2015-08-09 ENCOUNTER — Other Ambulatory Visit: Payer: Self-pay | Admitting: Family Medicine

## 2015-08-10 NOTE — Telephone Encounter (Signed)
Last filled 07/13/15, has appt 08/29/15. Rx will print

## 2015-08-20 ENCOUNTER — Other Ambulatory Visit: Payer: Self-pay | Admitting: Family Medicine

## 2015-08-22 NOTE — Telephone Encounter (Signed)
Last seen 02/22/15  DWM If approved route to nurse to call into the [facility]

## 2015-08-26 ENCOUNTER — Other Ambulatory Visit: Payer: Self-pay | Admitting: Family Medicine

## 2015-08-29 ENCOUNTER — Encounter: Payer: Self-pay | Admitting: Family Medicine

## 2015-08-29 ENCOUNTER — Encounter (INDEPENDENT_AMBULATORY_CARE_PROVIDER_SITE_OTHER): Payer: Self-pay

## 2015-08-29 ENCOUNTER — Ambulatory Visit (INDEPENDENT_AMBULATORY_CARE_PROVIDER_SITE_OTHER): Payer: Medicare Other | Admitting: Family Medicine

## 2015-08-29 VITALS — BP 142/82 | HR 102 | Temp 97.9°F | Ht 63.0 in | Wt 101.0 lb

## 2015-08-29 DIAGNOSIS — E559 Vitamin D deficiency, unspecified: Secondary | ICD-10-CM

## 2015-08-29 DIAGNOSIS — I10 Essential (primary) hypertension: Secondary | ICD-10-CM

## 2015-08-29 DIAGNOSIS — R54 Age-related physical debility: Secondary | ICD-10-CM | POA: Diagnosis not present

## 2015-08-29 DIAGNOSIS — I2510 Atherosclerotic heart disease of native coronary artery without angina pectoris: Secondary | ICD-10-CM

## 2015-08-29 DIAGNOSIS — R000 Tachycardia, unspecified: Secondary | ICD-10-CM | POA: Diagnosis not present

## 2015-08-29 DIAGNOSIS — L989 Disorder of the skin and subcutaneous tissue, unspecified: Secondary | ICD-10-CM

## 2015-08-29 NOTE — Patient Instructions (Addendum)
Medicare Annual Wellness Visit  [facility] and the medical providers at [facility] strive to bring you the best medical care.  In doing so we not only want to address your current medical conditions and concerns but also to detect new conditions early and prevent illness, disease and health-related problems.    Medicare offers a yearly Wellness Visit which allows our clinical staff to assess your need for preventative services including immunizations, lifestyle education, counseling to decrease risk of preventable diseases and screening for fall risk and other medical concerns.    This visit is provided free of charge (no copay) for all Medicare recipients. The clinical pharmacists at [facility] have begun to conduct these Wellness Visits which will also include a thorough review of all your medications.    As you primary medical provider recommend that you make an appointment for your Annual Wellness Visit if you have not done so already this year.  You may set up this appointment before you leave today or you may call back [phone] and schedule an appointment.  Please make sure when you call that you mention that you are scheduling your Annual Wellness Visit with the clinical pharmacist so that the appointment may be made for the proper length of time.     Continue current medications. Continue good therapeutic lifestyle changes which include good diet and exercise. Fall precautions discussed with patient. If an FOBT was given today- please return it to our front desk. If you are over 21 years old - you may need Prevnar 34 or the adult Pneumonia vaccine.  **Flu shots are available--- please call and schedule a FLU-CLINIC appointment**  After your visit with [place] today you will receive a survey in the mail or online from [employer] regarding your care with [place]. Please take a moment to fill this out. Your feedback is very  important to [place] as you can help [place] better understand your patient needs as well as improve your experience and satisfaction. WE CARE ABOUT YOU!!!   We will arrange for the patient to see a dermatologist regarding what appears to be a skin cancer on her left temple area. We will arrange this appointment with [facility] dermatology and we'll call her son in order to get him to take her to this visit She will continue to be careful and not put herself at risk for falling We will call with the lab work once those results become available

## 2015-08-29 NOTE — Progress Notes (Signed)
Subjective:    Patient ID: Amanda Hubbard, female    DOB: 05-05-1928, 81 y.o.   MRN: [id number]  HPI Pt here for follow up and management of chronic medical problems which includes hypertension. She is taking medications regularly. Patient usual this patient refuses any kind of health maintenance treatment and has done this for a good while. She is seen the cardiologist in the past but never follows his recommendations. She is 81 years old and we will not force her to have things done that she does not want to get done. She continues to have her ongoing back pain. She does have occasional shortness of breath but no more than usual and does not have any chest pain. She is swallowing without problems and has no indigestion or heartburn. She continues to have the right-sided abdominal pain and this is no worse than usual because from most likely scar tissue from previous surgery. She is voiding without problems.    Patient Active Problem List   Diagnosis Date Noted  . Senile purpura ([place]) 02/22/2015  . DYSLIPIDEMIA 12/21/2008  . Essential hypertension 12/21/2008  . Coronary atherosclerosis 12/21/2008  . Mitral valve disorder 12/21/2008  . OSTEOPENIA 12/21/2008  . INSOMNIA 12/21/2008   Outpatient Encounter Prescriptions as of 08/29/2015  Medication Sig  . amLODipine (NORVASC) 5 MG tablet TAKE ONE (1) TABLET EACH DAY  . aspirin 81 MG tablet Take 81 mg by mouth daily.    . DULoxetine (CYMBALTA) 30 MG capsule Take 1 capsule (30 mg total) by mouth daily.  Marland Kitchen lidocaine (LIDODERM) 5 % Apply 1 patch to area of pain.  Wear for 12 hours then remove & discard patch.  May reapply after 12 hours.  . meloxicam (MOBIC) 7.5 MG tablet Take 1 tablet (7.5 mg total) by mouth daily.  Marland Kitchen PREMARIN vaginal cream APPLY AS DIRECTED  . temazepam (RESTORIL) 30 MG capsule TAKE ONE CAPSULE AT BEDTIME  . traMADol (ULTRAM) 50 MG tablet TAKE ONE TABLET BY MOUTH TWICE DAILY   No facility-administered encounter medications  on file as of 08/29/2015.       Review of Systems  Constitutional: Negative.   HENT: Negative.   Eyes: Negative.   Respiratory: Negative.   Cardiovascular: Negative.   Gastrointestinal: Negative.   Endocrine: Negative.   Genitourinary: Negative.   Musculoskeletal: Positive for back pain (on- going).  Skin: Negative.   Allergic/Immunologic: Negative.   Neurological: Negative.   Hematological: Negative.   Psychiatric/Behavioral: Negative.        Objective:   Physical Exam  Constitutional: She is oriented to person, place, and time. No distress.  The patient is somewhat thin and elderly looking but alert  HENT:  Head: Normocephalic and atraumatic.  Right Ear: External ear normal.  Left Ear: External ear normal.  Nose: Nose normal.  Mouth/Throat: Oropharynx is clear and moist.  The patient has a skin lesion on her left temple which appears to be a carcinoma.  Eyes: Conjunctivae and EOM are normal. Pupils are equal, round, and reactive to light. Right eye exhibits no discharge. Left eye exhibits no discharge. No scleral icterus.  Neck: Normal range of motion. Neck supple. No thyromegaly present.  No bruits thyromegaly or anterior cervical adenopathy  Cardiovascular: Regular rhythm, normal heart sounds and intact distal pulses.   No murmur heard. The heart has a tachycardia at 108/m  Pulmonary/Chest: Effort normal and breath sounds normal. No respiratory distress. She has no wheezes. She has no rales. She exhibits no tenderness.  Clear anteriorly and posteriorly  Abdominal: Soft. Bowel sounds are normal. She exhibits no mass. There is tenderness. There is no rebound and no guarding.  Slight abdominal tenderness right side without liver or spleen enlargement  Musculoskeletal: Normal range of motion. She exhibits no edema or tenderness.  Lymphadenopathy:    She has no cervical adenopathy.  Neurological: She is alert and oriented to person, place, and time. She has normal  reflexes. No cranial nerve deficit.  Skin: Skin is warm and dry. No rash noted.  Psychiatric: She has a normal mood and affect. Her behavior is normal. Judgment and thought content normal.  Nursing note and vitals reviewed.  BP 155/87 mmHg  Pulse 102  Temp(Src) 97.9 F (36.6 C) (Oral)  Ht _0  (1.6 m)  Wt 101 lb (45.813 kg)  BMI 17.90 kg/m2  Repeat blood pressure was 142/82 in the right arm sitting      Assessment & Plan:  1. Vitamin D deficiency -We will will discuss the vitamin D results with her as soon as her test results are returned. - CBC with Differential/Platelet - VITAMIN D 25 Hydroxy (Vit-D Deficiency, Fractures)  2. Frailty -She should continue to be careful and move slowly and not put herself at risk for falling - CBC with Differential/Platelet  3. Essential hypertension -Repeat blood pressure was better than the initial blood pressure and almost less than 140 we will therefore not change her treatment. - BMP8+EGFR - CBC with Differential/Platelet - Hepatic function panel - Lipid panel  4. ASCVD (arteriosclerotic cardiovascular disease) -She only follows up with cardiology as needed. - CBC with Differential/Platelet - Lipid panel  5. Skin lesion of face -There is a shiny glistening lesion on her left temple area that is most surely a skin cancer she will be referred to have this removed - Ambulatory referral to Dermatology  Patient Instructions                       Medicare Annual Wellness Visit  [facility] and the medical providers at [facility] strive to bring you the best medical care.  In doing so we not only want to address your current medical conditions and concerns but also to detect new conditions early and prevent illness, disease and health-related problems.    Medicare offers a yearly Wellness Visit which allows our clinical staff to assess your need for preventative services including immunizations, lifestyle  education, counseling to decrease risk of preventable diseases and screening for fall risk and other medical concerns.    This visit is provided free of charge (no copay) for all Medicare recipients. The clinical pharmacists at [facility] have begun to conduct these Wellness Visits which will also include a thorough review of all your medications.    As you primary medical provider recommend that you make an appointment for your Annual Wellness Visit if you have not done so already this year.  You may set up this appointment before you leave today or you may call back [phone]) and schedule an appointment.  Please make sure when you call that you mention that you are scheduling your Annual Wellness Visit with the clinical pharmacist so that the appointment may be made for the proper length of time.     Continue current medications. Continue good therapeutic lifestyle changes which include good diet and exercise. Fall precautions discussed with patient. If an FOBT was given today- please return it to our front  desk. If you are over 32 years old - you may need Prevnar 50 or the adult Pneumonia vaccine.  **Flu shots are available--- please call and schedule a FLU-CLINIC appointment**  After your visit with [place] today you will receive a survey in the mail or online from [employer] regarding your care with [place]. Please take a moment to fill this out. Your feedback is very important to [place] as you can help [place] better understand your patient needs as well as improve your experience and satisfaction. WE CARE ABOUT YOU!!!   We will arrange for the patient to see a dermatologist regarding what appears to be a skin cancer on her left temple area. We will arrange this appointment with [facility] dermatology and we'll call her son in order to get him to take her to this visit She will continue to be careful and not put herself at risk for falling We will call with the lab work once those  results become available   Arrie Senate MD

## 2015-08-30 ENCOUNTER — Other Ambulatory Visit: Payer: Self-pay | Admitting: Physician Assistant

## 2015-08-30 LAB — HEPATIC FUNCTION PANEL
ALT: 12 IU/L (ref 0–32)
AST: 17 IU/L (ref 0–40)
Albumin: 4.3 g/dL (ref 3.5–4.7)
Alkaline Phosphatase: 68 IU/L (ref 39–117)
BILIRUBIN TOTAL: 0.9 mg/dL (ref 0.0–1.2)
BILIRUBIN, DIRECT: 0.18 mg/dL (ref 0.00–0.40)
TOTAL PROTEIN: 6.9 g/dL (ref 6.0–8.5)

## 2015-08-30 LAB — BMP8+EGFR
BUN/Creatinine Ratio: 15 (ref 11–26)
BUN: 18 mg/dL (ref 8–27)
CHLORIDE: 101 mmol/L (ref 97–106)
CO2: 24 mmol/L (ref 18–29)
Calcium: 9.8 mg/dL (ref 8.7–10.3)
Creatinine, Ser: 1.18 mg/dL — ABNORMAL HIGH (ref 0.57–1.00)
GFR calc non Af Amer: 42 mL/min/{1.73_m2} — ABNORMAL LOW (ref >59)
GFR, EST AFRICAN AMERICAN: 49 mL/min/{1.73_m2} — AB (ref >59)
Glucose: 118 mg/dL — ABNORMAL HIGH (ref 65–99)
Potassium: 4.9 mmol/L (ref 3.5–5.2)
Sodium: 143 mmol/L (ref 136–144)

## 2015-08-30 LAB — CBC WITH DIFFERENTIAL/PLATELET
Basophils Absolute: 0.1 10*3/uL (ref 0.0–0.2)
Basos: 1 %
EOS (ABSOLUTE): 0.3 10*3/uL (ref 0.0–0.4)
EOS: 2 %
HEMATOCRIT: 42.6 % (ref 34.0–46.6)
Hemoglobin: 14 g/dL (ref 11.1–15.9)
Immature Grans (Abs): 0 10*3/uL (ref 0.0–0.1)
Immature Granulocytes: 0 %
LYMPHS ABS: 2.2 10*3/uL (ref 0.7–3.1)
LYMPHS: 20 %
MCH: 30.6 pg (ref 26.6–33.0)
MCHC: 32.9 g/dL (ref 31.5–35.7)
MCV: 93 fL (ref 79–97)
MONOS ABS: 0.5 10*3/uL (ref 0.1–0.9)
Monocytes: 4 %
NEUTROS PCT: 73 %
Neutrophils Absolute: 8 10*3/uL — ABNORMAL HIGH (ref 1.4–7.0)
PLATELETS: 368 10*3/uL (ref 150–379)
RBC: 4.57 x10E6/uL (ref 3.77–5.28)
RDW: 15.4 % (ref 12.3–15.4)
WBC: 11 10*3/uL — ABNORMAL HIGH (ref 3.4–10.8)

## 2015-08-30 LAB — LIPID PANEL
CHOLESTEROL TOTAL: 280 mg/dL — AB (ref 100–199)
Chol/HDL Ratio: 3.8 ratio units (ref 0.0–4.4)
HDL: 73 mg/dL (ref >39)
LDL CALC: 184 mg/dL — AB (ref 0–99)
Triglycerides: 114 mg/dL (ref 0–149)
VLDL CHOLESTEROL CAL: 23 mg/dL (ref 5–40)

## 2015-08-30 LAB — VITAMIN D 25 HYDROXY (VIT D DEFICIENCY, FRACTURES): Vit D, 25-Hydroxy: 10.1 ng/mL — ABNORMAL LOW (ref 30.0–100.0)

## 2015-09-09 ENCOUNTER — Other Ambulatory Visit: Payer: Self-pay | Admitting: Family Medicine

## 2015-09-12 NOTE — Telephone Encounter (Signed)
Last seen 08/29/15 [facility]  If approved print

## 2015-09-12 NOTE — Telephone Encounter (Signed)
This is okay to refill 1 

## 2015-09-12 NOTE — Telephone Encounter (Signed)
Called to the drug store 

## 2015-09-27 ENCOUNTER — Other Ambulatory Visit: Payer: Self-pay | Admitting: Family Medicine

## 2015-10-17 ENCOUNTER — Other Ambulatory Visit: Payer: Self-pay | Admitting: Family Medicine

## 2015-10-18 NOTE — Telephone Encounter (Signed)
Last seen 08/29/15  [facility]

## 2015-10-20 ENCOUNTER — Other Ambulatory Visit: Payer: Self-pay | Admitting: Family Medicine

## 2015-10-20 NOTE — Telephone Encounter (Signed)
Last filled 09/12/15, last seen 08/29/15

## 2015-11-21 ENCOUNTER — Other Ambulatory Visit: Payer: Self-pay | Admitting: Family Medicine

## 2015-11-22 NOTE — Telephone Encounter (Signed)
Patient was last seen on 08-29-15. Rx last filled on 1-19 for #60. Please advise. Rx will print if approved. Please route to Pool A so nurse can call patient to pick up

## 2015-11-23 ENCOUNTER — Other Ambulatory Visit: Payer: Self-pay | Admitting: Family Medicine

## 2015-11-23 ENCOUNTER — Telehealth: Payer: Self-pay

## 2015-11-23 NOTE — Telephone Encounter (Signed)
Last filled 10/20/15, last seen 08/28/16

## 2015-11-23 NOTE — Telephone Encounter (Signed)
Pt aware rx ready to be picked up. Rx placed up front.

## 2015-12-12 ENCOUNTER — Other Ambulatory Visit: Payer: Self-pay | Admitting: Family Medicine

## 2015-12-12 NOTE — Telephone Encounter (Signed)
Last filled 11/14/15, last filled 08/28/16. [facility]

## 2016-01-04 ENCOUNTER — Other Ambulatory Visit: Payer: Self-pay | Admitting: Family Medicine

## 2016-01-04 NOTE — Telephone Encounter (Signed)
Last seen 08/29/15 [facility]    If approved print

## 2016-01-27 DIAGNOSIS — G8929 Other chronic pain: Secondary | ICD-10-CM | POA: Diagnosis not present

## 2016-01-27 DIAGNOSIS — Z79899 Other long term (current) drug therapy: Secondary | ICD-10-CM | POA: Diagnosis not present

## 2016-01-27 DIAGNOSIS — I10 Essential (primary) hypertension: Secondary | ICD-10-CM | POA: Diagnosis not present

## 2016-01-27 DIAGNOSIS — M1990 Unspecified osteoarthritis, unspecified site: Secondary | ICD-10-CM | POA: Diagnosis not present

## 2016-01-27 DIAGNOSIS — M545 Low back pain: Secondary | ICD-10-CM | POA: Diagnosis not present

## 2016-01-30 ENCOUNTER — Other Ambulatory Visit: Payer: Self-pay | Admitting: Family Medicine

## 2016-01-30 ENCOUNTER — Telehealth: Payer: Self-pay | Admitting: *Deleted

## 2016-01-30 MED ORDER — TEMAZEPAM 30 MG PO CAPS: 30.0000 mg | ORAL_CAPSULE | Freq: Every day | ORAL | Status: DC

## 2016-01-30 NOTE — Telephone Encounter (Signed)
Pt was admitted to Hospice today. They need new orders for oxycodone and ativan and a refill on trazodone. Dr. Sabra Heck gave orders for meds in Dr Georgia Duff absence. Faxed scheduled ones to [facility], and refilled trazodone in epic.

## 2016-01-30 NOTE — Telephone Encounter (Signed)
Son is requesting hospice service in home for his mother. He said she is in bed 99% of the time. She has not eaten in a week and is becoming weak and malnourished.  Dr Laurance Flatten gave verbal order for hospice. [facility] was given referral

## 2016-01-31 ENCOUNTER — Other Ambulatory Visit: Payer: Self-pay | Admitting: *Deleted

## 2016-01-31 MED ORDER — TEMAZEPAM 30 MG PO CAPS: 30.0000 mg | ORAL_CAPSULE | Freq: Every day | ORAL | Status: DC

## 2016-02-01 ENCOUNTER — Other Ambulatory Visit: Payer: Self-pay | Admitting: *Deleted

## 2016-02-01 DIAGNOSIS — Z515 Encounter for palliative care: Secondary | ICD-10-CM

## 2016-02-01 NOTE — Progress Notes (Signed)
Patient under care of hospice. DNR ordered and scanned into chart

## 2016-02-21 ENCOUNTER — Other Ambulatory Visit: Payer: Self-pay | Admitting: Family Medicine

## 2016-02-29 ENCOUNTER — Ambulatory Visit: Payer: Medicare Other | Admitting: Family Medicine

## 2016-03-08 ENCOUNTER — Telehealth: Payer: Self-pay | Admitting: *Deleted

## 2016-03-08 ENCOUNTER — Other Ambulatory Visit: Payer: Self-pay

## 2016-03-08 MED ORDER — NITROFURANTOIN MONOHYD MACRO 100 MG PO CAPS: 100.0000 mg | ORAL_CAPSULE | Freq: Two times a day (BID) | ORAL | Status: DC

## 2016-03-08 NOTE — Telephone Encounter (Signed)
Rx sent to [facility] 

## 2016-03-08 NOTE — Telephone Encounter (Signed)
Hospice nurse says pt is having a hard time voiding and has a low grade temp. Can you call in an antibiotic to [facility]? Also, call hospice nurse back.

## 2016-03-08 NOTE — Telephone Encounter (Signed)
Give her Macrobid 100 mg twice a day #14

## 2016-03-09 ENCOUNTER — Telehealth: Payer: Self-pay

## 2016-03-09 MED ORDER — SULFAMETHOXAZOLE-TRIMETHOPRIM 800-160 MG PO TABS: 1.0000 | ORAL_TABLET | Freq: Two times a day (BID) | ORAL | Status: DC

## 2016-03-09 NOTE — Telephone Encounter (Signed)
Which med would you prefer?

## 2016-03-09 NOTE — Addendum Note (Signed)
Addended by: Wardell Honour on: 03/09/2016 05:06 PM   Modules accepted: Orders

## 2016-03-09 NOTE — Telephone Encounter (Signed)
The Macrobid you called in  Yesterday for patient is non  Formulary  Formulary are Ciprofloxacin, Levofloxacin and sulfamethoxazole/trimethopin   Can you change?

## 2016-03-12 NOTE — Telephone Encounter (Signed)
Macrobid sent to pharmacy.

## 2016-03-13 ENCOUNTER — Other Ambulatory Visit: Payer: Self-pay | Admitting: *Deleted

## 2016-03-13 MED ORDER — OXYCODONE-ACETAMINOPHEN 5-325 MG PO TABS: ORAL_TABLET | ORAL | Status: DC

## 2016-03-13 NOTE — Telephone Encounter (Signed)
Hospice patient. Please print and fax to [facility].

## 2016-03-22 ENCOUNTER — Other Ambulatory Visit: Payer: Self-pay | Admitting: *Deleted

## 2016-03-22 ENCOUNTER — Other Ambulatory Visit: Payer: Self-pay

## 2016-03-22 MED ORDER — OXYCODONE-ACETAMINOPHEN 5-325 MG PO TABS
ORAL_TABLET | ORAL | Status: DC
Start: 2016-03-22 — End: 2016-05-07

## 2016-03-28 ENCOUNTER — Other Ambulatory Visit: Payer: Self-pay | Admitting: *Deleted

## 2016-03-28 MED ORDER — TEMAZEPAM 30 MG PO CAPS: 30.0000 mg | ORAL_CAPSULE | Freq: Every day | ORAL | Status: DC

## 2016-03-29 ENCOUNTER — Other Ambulatory Visit: Payer: Self-pay | Admitting: Family Medicine

## 2016-03-29 NOTE — Telephone Encounter (Signed)
Rx called into pharmacy and pt is aware. 

## 2016-04-17 ENCOUNTER — Other Ambulatory Visit: Payer: Self-pay | Admitting: Family Medicine

## 2016-04-18 ENCOUNTER — Other Ambulatory Visit: Payer: Self-pay

## 2016-04-18 MED ORDER — TEMAZEPAM 30 MG PO CAPS: 30.0000 mg | ORAL_CAPSULE | Freq: Every day | ORAL | Status: DC

## 2016-04-18 NOTE — Telephone Encounter (Signed)
Last seen 08/29/15  [facility]  If approved route to nurse to call into [facility]

## 2016-04-26 ENCOUNTER — Telehealth: Payer: Self-pay | Admitting: Family Medicine

## 2016-04-26 DIAGNOSIS — R300 Dysuria: Secondary | ICD-10-CM

## 2016-04-26 NOTE — Telephone Encounter (Signed)
Check clean-catch midstream urine specimen and get urine culture

## 2016-04-27 ENCOUNTER — Telehealth: Payer: Self-pay

## 2016-04-27 ENCOUNTER — Other Ambulatory Visit

## 2016-04-27 DIAGNOSIS — R300 Dysuria: Secondary | ICD-10-CM

## 2016-04-27 NOTE — Telephone Encounter (Signed)
Hospice aware

## 2016-04-27 NOTE — Telephone Encounter (Signed)
[facility] refer to [facility]

## 2016-04-29 LAB — URINE CULTURE

## 2016-04-30 ENCOUNTER — Other Ambulatory Visit: Payer: Self-pay | Admitting: *Deleted

## 2016-04-30 DIAGNOSIS — N3000 Acute cystitis without hematuria: Secondary | ICD-10-CM

## 2016-04-30 MED ORDER — NITROFURANTOIN MONOHYD MACRO 100 MG PO CAPS: 100.0000 mg | ORAL_CAPSULE | Freq: Two times a day (BID) | ORAL | 0 refills | Status: DC

## 2016-05-07 ENCOUNTER — Encounter: Payer: Self-pay | Admitting: Family Medicine

## 2016-05-07 ENCOUNTER — Ambulatory Visit (INDEPENDENT_AMBULATORY_CARE_PROVIDER_SITE_OTHER): Payer: PPO | Admitting: Family Medicine

## 2016-05-07 VITALS — BP 159/79 | HR 100 | Temp 98.6°F | Ht 63.0 in

## 2016-05-07 DIAGNOSIS — G4700 Insomnia, unspecified: Secondary | ICD-10-CM

## 2016-05-07 DIAGNOSIS — R627 Adult failure to thrive: Secondary | ICD-10-CM | POA: Diagnosis not present

## 2016-05-07 DIAGNOSIS — I10 Essential (primary) hypertension: Secondary | ICD-10-CM

## 2016-05-07 DIAGNOSIS — R1011 Right upper quadrant pain: Secondary | ICD-10-CM

## 2016-05-07 MED ORDER — LORAZEPAM 2 MG PO TABS: 2.0000 mg | ORAL_TABLET | Freq: Every day | ORAL | 0 refills | Status: DC

## 2016-05-07 MED ORDER — AMLODIPINE BESYLATE 5 MG PO TABS: 5.0000 mg | ORAL_TABLET | Freq: Every day | ORAL | 1 refills | Status: DC

## 2016-05-07 MED ORDER — FENTANYL 50 MCG/HR TD PT72: 50.0000 ug | MEDICATED_PATCH | TRANSDERMAL | 0 refills | Status: DC

## 2016-05-07 MED ORDER — PREDNISONE 20 MG PO TABS: 20.0000 mg | ORAL_TABLET | Freq: Every day | ORAL | 1 refills | Status: DC

## 2016-05-07 NOTE — Progress Notes (Signed)
BP (!) 159/79   Pulse 100   Temp 98.6 F (37 C) (Oral)   Ht 5\' 3"  (1.6 m)    Subjective:    Patient ID: Amanda Hubbard, female    DOB: 03-17-1928, 82 y.o.   MRN: [id number]  HPI: Amanda Hubbard is a 82 y.o. female presenting on 05/07/2016 for face to face   HPI Hypertension Patient comes in for a hypertension recheck. Her blood pressure is 149/79 initially and 159/79 on repeat. She says that it runs a lot better at home than here and will bring numbers for the next time. This is also related to [place] by her family members. She just got off hospice care and was initially on their for failure to thrive and suspected malignancy although they did not want to do the imaging further to follow it up. During the hospice care that they said that her blood pressure ran pretty well. Patient denies headaches, blurred vision, chest pains, shortness of breath, or weakness. Denies any side effects from medication and is content with current medication.   Insomnia Patient needs a refill on her temazepam which she uses for insomnia and she also occasionally uses lorazepam for it as well. He usually works pretty good for her and she sleeps well at night with the medication.  Failure to thrive and right upper quadrant abdominal pain and pressure Patient comes in today to hopefully get some home health care because of her failure to thrive and weight loss and concern for possible underlying malignancy in her right upper abdominal region. This is the reason that she was on hospice but when she was on hospice she started eating slightly better and started to gain some weight and they deemed her not hospice eligible anymore but because of this she still does need some home health care and AIDS because she has difficulty getting around and doing things on her own. Because of her lack of energy and mobility.  Relevant past medical, surgical, family and social history reviewed and updated as indicated. Interim medical  history since our last visit reviewed. Allergies and medications reviewed and updated.  Review of Systems  Constitutional: Positive for activity change, appetite change, fatigue and unexpected weight change. Negative for chills and fever.  HENT: Negative for congestion, ear discharge and ear pain.   Eyes: Negative for redness and visual disturbance.  Respiratory: Negative for chest tightness and shortness of breath.   Cardiovascular: Negative for chest pain and leg swelling.  Gastrointestinal: Positive for abdominal pain. Negative for abdominal distention, anal bleeding, blood in stool, constipation, diarrhea, nausea and vomiting.  Genitourinary: Negative for difficulty urinating and dysuria.  Musculoskeletal: Negative for back pain and gait problem.  Skin: Negative for rash.  Neurological: Positive for weakness (Generalized). Negative for dizziness, light-headedness, numbness and headaches.  Psychiatric/Behavioral: Negative for agitation and behavioral problems.  All other systems reviewed and are negative.  Per HPI unless specifically indicated above     Medication List       Accurate as of 05/07/16  3:22 PM. Always use your most recent med list.          ACETAMINOPHEN 8 HOUR 650 MG CR tablet Generic drug:  acetaminophen Take 650 mg by mouth every 8 (eight) hours as needed for pain.   amLODipine 5 MG tablet Commonly known as:  NORVASC Take 1 tablet (5 mg total) by mouth daily.   fentaNYL 50 MCG/HR Commonly known as:  DURAGESIC - dosed mcg/hr Place 1  patch (50 mcg total) onto the skin every 3 (three) days.   gabapentin 300 MG capsule Commonly known as:  NEURONTIN Take 300 mg by mouth 3 (three) times daily.   LORazepam 2 MG tablet Commonly known as:  ATIVAN Take 1 tablet (2 mg total) by mouth at bedtime.   predniSONE 20 MG tablet Commonly known as:  DELTASONE Take 1 tablet (20 mg total) by mouth daily with breakfast.   SENEXON-S 8.6-50 MG tablet Generic drug:   senna-docusate TAKE 4 TABLETS BY MOUTH 3 TIMES A DAY UNTIL BOWEL MOVEMENT.   temazepam 30 MG capsule Commonly known as:  RESTORIL Take 1 capsule (30 mg total) by mouth at bedtime.          Objective:    BP (!) 149/79   Pulse 100   Temp 98.6 F (37 C) (Oral)   Ht 5\' 3"  (1.6 m)   Wt Readings from Last 3 Encounters:  08/29/15 101 lb (45.8 kg)  02/22/15 105 lb (47.6 kg)  01/24/15 104 lb (47.2 kg)    Physical Exam  Constitutional: She is oriented to person, place, and time. No distress.  Patient is very thin  Eyes: Conjunctivae and EOM are normal. Pupils are equal, round, and reactive to light.  Neck: Neck supple. No thyromegaly present.  Cardiovascular: Normal rate, regular rhythm, normal heart sounds and intact distal pulses.   No murmur heard. Pulmonary/Chest: Effort normal and breath sounds normal. No respiratory distress. She has no wheezes.  Abdominal: Soft. Bowel sounds are normal. She exhibits no distension. There is no tenderness (No tenderness on exam but patient does have right upper quadrant abdominal fullness). There is no rebound and no guarding.  Musculoskeletal: Normal range of motion. She exhibits no edema or tenderness.  Lymphadenopathy:    She has no cervical adenopathy.  Neurological: She is alert and oriented to person, place, and time. Coordination normal.  Skin: Skin is warm and dry. No rash noted. She is not diaphoretic.  Psychiatric: She has a normal mood and affect. Her behavior is normal.  Nursing note and vitals reviewed.     Assessment & Plan:   Problem List Items Addressed This Visit      Cardiovascular and Mediastinum   Essential hypertension   Relevant Medications   amLODipine (NORVASC) 5 MG tablet     Other   INSOMNIA   Relevant Medications   LORazepam (ATIVAN) 2 MG tablet    Other Visit Diagnoses    Abdominal pain, right upper quadrant    -  Primary   Relevant Medications   fentaNYL (DURAGESIC - DOSED MCG/HR) 50 MCG/HR   Failure  to thrive in adult       Relevant Medications   predniSONE (DELTASONE) 20 MG tablet   Other Relevant Orders   Home Health   Face-to-face encounter (required for Medicare/Medicaid patients)      Follow up plan: Return in about 4 weeks (around 06/04/2016), or if symptoms worsen or fail to improve, for f/u with [facility] for initial pain contract.  Counseling provided for all of the vaccine components Orders Placed This Encounter  Procedures  . Home Health  . Face-to-face encounter (required for Medicare/Medicaid patients)    Caryl Pina, MD [facility] 05/07/2016, 3:22 PM

## 2016-05-14 DIAGNOSIS — I10 Essential (primary) hypertension: Secondary | ICD-10-CM | POA: Diagnosis not present

## 2016-05-14 DIAGNOSIS — F419 Anxiety disorder, unspecified: Secondary | ICD-10-CM | POA: Diagnosis not present

## 2016-05-14 DIAGNOSIS — Z7952 Long term (current) use of systemic steroids: Secondary | ICD-10-CM | POA: Diagnosis not present

## 2016-05-14 DIAGNOSIS — Z466 Encounter for fitting and adjustment of urinary device: Secondary | ICD-10-CM | POA: Diagnosis not present

## 2016-05-14 DIAGNOSIS — Z85528 Personal history of other malignant neoplasm of kidney: Secondary | ICD-10-CM | POA: Diagnosis not present

## 2016-05-14 DIAGNOSIS — E46 Unspecified protein-calorie malnutrition: Secondary | ICD-10-CM | POA: Diagnosis not present

## 2016-05-15 DIAGNOSIS — F419 Anxiety disorder, unspecified: Secondary | ICD-10-CM | POA: Diagnosis not present

## 2016-05-15 DIAGNOSIS — E46 Unspecified protein-calorie malnutrition: Secondary | ICD-10-CM | POA: Diagnosis not present

## 2016-05-15 DIAGNOSIS — I10 Essential (primary) hypertension: Secondary | ICD-10-CM | POA: Diagnosis not present

## 2016-05-16 ENCOUNTER — Encounter: Payer: Self-pay | Admitting: Family Medicine

## 2016-05-16 ENCOUNTER — Ambulatory Visit (INDEPENDENT_AMBULATORY_CARE_PROVIDER_SITE_OTHER): Payer: PPO | Admitting: Family Medicine

## 2016-05-16 ENCOUNTER — Telehealth: Payer: Self-pay | Admitting: Family Medicine

## 2016-05-16 VITALS — BP 150/74 | HR 104 | Temp 98.2°F

## 2016-05-16 DIAGNOSIS — M545 Low back pain, unspecified: Secondary | ICD-10-CM

## 2016-05-16 DIAGNOSIS — N3090 Cystitis, unspecified without hematuria: Secondary | ICD-10-CM

## 2016-05-16 DIAGNOSIS — R300 Dysuria: Secondary | ICD-10-CM | POA: Diagnosis not present

## 2016-05-16 DIAGNOSIS — Z466 Encounter for fitting and adjustment of urinary device: Secondary | ICD-10-CM | POA: Diagnosis not present

## 2016-05-16 DIAGNOSIS — E46 Unspecified protein-calorie malnutrition: Secondary | ICD-10-CM | POA: Diagnosis not present

## 2016-05-16 DIAGNOSIS — F419 Anxiety disorder, unspecified: Secondary | ICD-10-CM | POA: Diagnosis not present

## 2016-05-16 DIAGNOSIS — N390 Urinary tract infection, site not specified: Secondary | ICD-10-CM | POA: Diagnosis not present

## 2016-05-16 DIAGNOSIS — Z85528 Personal history of other malignant neoplasm of kidney: Secondary | ICD-10-CM | POA: Diagnosis not present

## 2016-05-16 DIAGNOSIS — R1011 Right upper quadrant pain: Secondary | ICD-10-CM

## 2016-05-16 DIAGNOSIS — Z7952 Long term (current) use of systemic steroids: Secondary | ICD-10-CM | POA: Diagnosis not present

## 2016-05-16 DIAGNOSIS — T83511A Infection and inflammatory reaction due to indwelling urethral catheter, initial encounter: Secondary | ICD-10-CM

## 2016-05-16 DIAGNOSIS — I10 Essential (primary) hypertension: Secondary | ICD-10-CM | POA: Diagnosis not present

## 2016-05-16 MED ORDER — GABAPENTIN 300 MG PO CAPS: 300.0000 mg | ORAL_CAPSULE | Freq: Three times a day (TID) | ORAL | 0 refills | Status: DC

## 2016-05-16 MED ORDER — NITROFURANTOIN MONOHYD MACRO 100 MG PO CAPS: 100.0000 mg | ORAL_CAPSULE | Freq: Two times a day (BID) | ORAL | 0 refills | Status: DC

## 2016-05-16 MED ORDER — PHENAZOPYRIDINE HCL 100 MG PO TABS: 100.0000 mg | ORAL_TABLET | Freq: Three times a day (TID) | ORAL | 0 refills | Status: DC

## 2016-05-16 MED ORDER — TEMAZEPAM 30 MG PO CAPS: 30.0000 mg | ORAL_CAPSULE | Freq: Every day | ORAL | 0 refills | Status: DC

## 2016-05-16 NOTE — Telephone Encounter (Signed)
Pt advised no openings with [facility] today but offered appt with Dr.Stacks at 3:55 today and pt accepted.

## 2016-05-16 NOTE — Progress Notes (Signed)
Subjective:  Patient ID: Amanda Hubbard, female    DOB: 1927/12/08  Age: 82 y.o. MRN: [id number]  CC: RLQ pain (x yrs, worsening today)   HPI Amanda Hubbard presents for Severe burning with urination. Midline low back pain worsening with the onset of the dysuria. Pain also in the right lower quadrant is similar to that that she's had for many years that is felt to be secondary to surgical scars in the abdomen. It is more severe than usual today. Patient really would like something to help relieve the intensity of the pain. She has an indwelling Foley catheter. So the burning sensation is intermittent but frequency and urgency cannot be determined  History Amanda Hubbard has a past medical history of CAD (coronary artery disease); Dyslipidemia; Female bladder prolapse; Hypertension; Insomnia; Mitral valve prolapse; Osteopenia; and Osteoporosis.   She has a past surgical history that includes Coronary artery bypass graft (2002); Nephrectomy (1980's); Vesicovaginal fistula closure w/ TAH; Nasal fracture surgery; and Bladder surgery.   Her family history includes Cancer in her maternal grandmother.She reports that she has never smoked. She has never used smokeless tobacco. She reports that she does not drink alcohol or use drugs.    ROS Review of Systems  Constitutional: Negative for chills, diaphoresis and fever.  HENT: Negative for congestion.   Eyes: Negative for visual disturbance.  Respiratory: Negative for cough and shortness of breath.   Cardiovascular: Negative for chest pain and palpitations.  Gastrointestinal: Positive for abdominal pain (RLQ). Negative for constipation, diarrhea and nausea.  Genitourinary: Positive for dysuria, frequency and urgency. Negative for decreased urine volume, flank pain, hematuria, menstrual problem and pelvic pain.  Musculoskeletal: Positive for back pain. Negative for arthralgias and joint swelling.  Skin: Negative for rash.  Neurological: Negative for  dizziness and numbness.    Objective:  BP (!) 150/74 (BP Location: Left Arm, Patient Position: Sitting, Cuff Size: Small)   Pulse (!) 104   Temp 98.2 F (36.8 C) (Oral)   SpO2 95%   BP Readings from Last 3 Encounters:  05/16/16 (!) 150/74  05/07/16 (!) 159/79  08/29/15 (!) 142/82    Wt Readings from Last 3 Encounters:  08/29/15 101 lb (45.8 kg)  02/22/15 105 lb (47.6 kg)  01/24/15 104 lb (47.2 kg)     Physical Exam  Constitutional: She appears well-developed and well-nourished. No distress.  HENT:  Head: Normocephalic and atraumatic.  Cardiovascular: Normal rate and regular rhythm.   Pulmonary/Chest: Breath sounds normal.  Abdominal: Soft. Bowel sounds are normal. There is tenderness (mild LLQ).  Musculoskeletal: She exhibits tenderness (midline L4-5).  Wheelchair bound   Neurological: She is alert.  Skin: Skin is warm and dry.  Psychiatric: She has a normal mood and affect. Her behavior is normal.     Lab Results  Component Value Date   WBC 11.0 (H) 08/29/2015   HGB 13.3 02/22/2015   HCT 42.6 08/29/2015   PLT 368 08/29/2015   GLUCOSE 118 (H) 08/29/2015   CHOL 280 (H) 08/29/2015   TRIG 114 08/29/2015   HDL 73 08/29/2015   LDLCALC 184 (H) 08/29/2015   ALT 12 08/29/2015   AST 17 08/29/2015   NA 143 08/29/2015   K 4.9 08/29/2015   CL 101 08/29/2015   CREATININE 1.18 (H) 08/29/2015   BUN 18 08/29/2015   CO2 24 08/29/2015   INR 1.0 06/11/2008    Ct Abdomen Pelvis W Contrast  Addendum Date: 08/26/2012   **ADDENDUM** CREATED: 08/26/2012 13:18:45 The patient  had a prior CT in 2007 at [facility] under a different patient ID.  These PACS files have now been merged together, and the previous noncontrast abdomen CT from 05/23/2006 is now available for comparison with the current exam. Comparison shows that the complex cystic mass in the pancreatic head has mildly increased in size since previous study, currently measuring 5.4 x 4.6 cm compared to 4.0 x 3.1  cm on previous exam. This lesion also shows more complex features, although this could be due to the fact that the previous study did not utilize IV contrast.  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  Endoscopic ultrasound and cyst aspiration should be considered based on current consensus guidelines, as cited in the original report below. These results will be called to the ordering clinician or representative by the Radiologist Assistant, and communication documented in the PACS Dashboard. **END ADDENDUM** SIGNED BY: Amanda Hubbard, M.D.  Result Date: 08/20/2012 *RADIOLOGY REPORT* Clinical Data: Abdominal pain. Constipation.  Personal history of renal cell carcinoma. CT ABDOMEN AND PELVIS WITH CONTRAST Technique:  Multidetector CT imaging of the abdomen and pelvis was performed following the standard protocol during bolus administration of intravenous contrast. Contrast: 181mL OMNIPAQUE IOHEXOL 300 MG/ML  SOLN Comparison: None. Findings: Numerous tiny gallstones are seen.  Gallbladder is distended, however there is no evidence of gallbladder wall thickening or pericholecystic inflammatory changes. Diffuse biliary ductal dilatation is seen.  A complex cystic mass is seen in the porta hepatis arising from the pancreatic head which measures 4.6 x 5.4 cm.  This mass both thin and thickened internal septations, with multiple individual cystic foci measuring up to 3 cm.  Differential diagnosis includes serous and mucinous cystic pancreatic neoplasms. Mild pancreatic ductal dilatation is seen in the head and neck, but not the body or tail. Prior hysterectomy noted.  No soft tissue masses are seen in the nephrectomy bed.  A small left renal cyst is seen however there is no evidence of left renal mass or hydronephrosis.  No evidence of retroperitoneal or other abdominal lymphadenopathy. No pelvic soft tissue masses or  lymphadenopathy identified. Previous hysterectomy noted.  Adnexal regions are unremarkable.  No evidence of inflammatory process, abscess, or ascites. IMPRESSION: 1.  5.4 cm complex cystic mass arising from the pancreatic head. Differential diagnosis includes both serous and mucinous cystic pancreatic neoplasms. Given its size, endoscopic ultrasound and cyst aspiration should be considered.  This recommendation follows ACR consensus guidelines:  Managing Incidental Findings on Abdominal CT:  White Paper of the ACR Incidental Findings Committee.  J Am Coll Radiol 2010;7:754-773 2.  Cholelithiasis and diffuse biliary ductal dilatation.  No definite signs of acute cholecystitis. 3.  No evidence of metastatic disease. Original Report Authenticated By: Amanda Hubbard, M.D.    Assessment & Plan:   Mava was seen today for rlq pain.  Diagnoses and all orders for this visit:  Dysuria -     Urinalysis, Complete -     Urine culture  Abdominal pain, right upper quadrant -     Urine culture  Midline low back pain without sciatica -     Urine culture  Cystitis -     Urine culture  Urinary tract infection associated with catheterization of urinary tract, initial encounter  Other orders -     nitrofurantoin, macrocrystal-monohydrate, (MACROBID) 100 MG capsule; Take 1 capsule (100 mg total) by mouth 2 (two) times daily. -  phenazopyridine (PYRIDIUM) 100 MG tablet; Take 1 tablet (100 mg total) by mouth 3 (three) times daily with meals. For 2 days -     gabapentin (NEURONTIN) 300 MG capsule; Take 1 capsule (300 mg total) by mouth 3 (three) times daily. -     temazepam (RESTORIL) 30 MG capsule; Take 1 capsule (30 mg total) by mouth at bedtime.    I have changed Ms. Royle's gabapentin. I am also having her start on nitrofurantoin (macrocrystal-monohydrate) and phenazopyridine. Additionally, I am having her maintain her SENEXON-S, acetaminophen, amLODipine, LORazepam, predniSONE, fentaNYL, and  temazepam.  Meds ordered this encounter  Medications  . nitrofurantoin, macrocrystal-monohydrate, (MACROBID) 100 MG capsule    Sig: Take 1 capsule (100 mg total) by mouth 2 (two) times daily.    Dispense:  14 capsule    Refill:  0  . phenazopyridine (PYRIDIUM) 100 MG tablet    Sig: Take 1 tablet (100 mg total) by mouth 3 (three) times daily with meals. For 2 days    Dispense:  6 tablet    Refill:  0  . gabapentin (NEURONTIN) 300 MG capsule    Sig: Take 1 capsule (300 mg total) by mouth 3 (three) times daily.    Dispense:  90 capsule    Refill:  0  . temazepam (RESTORIL) 30 MG capsule    Sig: Take 1 capsule (30 mg total) by mouth at bedtime.    Dispense:  15 capsule    Refill:  0     Follow-up: Return if symptoms worsen or fail to improve.  Amanda Hubbard, M.D.

## 2016-05-17 LAB — MICROSCOPIC EXAMINATION

## 2016-05-17 LAB — URINALYSIS, COMPLETE
BILIRUBIN UA: NEGATIVE
GLUCOSE, UA: NEGATIVE
KETONES UA: NEGATIVE
Nitrite, UA: POSITIVE — AB
Protein, UA: NEGATIVE
Urobilinogen, Ur: 0.2 mg/dL (ref 0.2–1.0)
pH, UA: 5 (ref 5.0–7.5)

## 2016-05-18 LAB — URINE CULTURE

## 2016-05-21 ENCOUNTER — Other Ambulatory Visit: Payer: Self-pay | Admitting: Family Medicine

## 2016-05-21 ENCOUNTER — Telehealth: Payer: Self-pay | Admitting: *Deleted

## 2016-05-21 ENCOUNTER — Other Ambulatory Visit: Payer: Self-pay | Admitting: *Deleted

## 2016-05-21 DIAGNOSIS — R1011 Right upper quadrant pain: Secondary | ICD-10-CM

## 2016-05-21 MED ORDER — FENTANYL 50 MCG/HR TD PT72: 50.0000 ug | MEDICATED_PATCH | TRANSDERMAL | 0 refills | Status: DC

## 2016-05-21 NOTE — Telephone Encounter (Signed)
Pt notified of results Verbalizes understanding 

## 2016-05-21 NOTE — Telephone Encounter (Signed)
Pt aware.

## 2016-05-21 NOTE — Telephone Encounter (Signed)
The requested med has been written  Please let the patient know. Thanks, WS

## 2016-05-21 NOTE — Telephone Encounter (Signed)
Pt states she will use her last one tomorrow and [facility] is out of the office until 05/28/16. Please advise.

## 2016-05-21 NOTE — Telephone Encounter (Signed)
-----   Message from Claretta Fraise, MD sent at 05/20/2016  2:40 PM EDT ----- Your urine culture confirmed the presence of an infection. The medication that was prescribed should help.  Best regards, Claretta Fraise

## 2016-05-21 NOTE — Telephone Encounter (Signed)
She was supposed to get this from Dr. Laurance Flatten when she sees him. Office Opiate policy mandates that it always come from the patient's PCP. She has an appt. With him on 8/30. Family told me she had enough until she sees Dr. Laurance Flatten when I saw her last week.  I'm sorry for the inconvenience. WS

## 2016-05-22 ENCOUNTER — Other Ambulatory Visit: Payer: Self-pay | Admitting: Family Medicine

## 2016-05-22 DIAGNOSIS — G4700 Insomnia, unspecified: Secondary | ICD-10-CM

## 2016-05-22 MED ORDER — LORAZEPAM 2 MG PO TABS: 2.0000 mg | ORAL_TABLET | Freq: Every day | ORAL | 1 refills | Status: DC

## 2016-05-22 NOTE — Telephone Encounter (Signed)
Requesting Lorazepam refill. Last written on 05/07/16 for 30 tablets.   If approved route to [facility] for nurse to call into [facility] in [place]

## 2016-05-23 ENCOUNTER — Telehealth: Payer: Self-pay | Admitting: Family Medicine

## 2016-05-23 NOTE — Telephone Encounter (Signed)
This was done yesterday for 2 mg --- he will cut these for her instead of having 2 rx's for the same med

## 2016-05-25 ENCOUNTER — Telehealth: Payer: Self-pay | Admitting: *Deleted

## 2016-05-25 ENCOUNTER — Telehealth: Payer: Self-pay | Admitting: Family Medicine

## 2016-05-25 ENCOUNTER — Other Ambulatory Visit: Payer: PPO

## 2016-05-25 DIAGNOSIS — R300 Dysuria: Secondary | ICD-10-CM

## 2016-05-25 DIAGNOSIS — R3989 Other symptoms and signs involving the genitourinary system: Secondary | ICD-10-CM | POA: Diagnosis not present

## 2016-05-25 LAB — URINALYSIS, COMPLETE
BILIRUBIN UA: NEGATIVE
Leukocytes, UA: NEGATIVE
NITRITE UA: POSITIVE — AB
SPEC GRAV UA: 1.015 (ref 1.005–1.030)
UUROB: 4 mg/dL — AB (ref 0.2–1.0)
pH, UA: 7 (ref 5.0–7.5)

## 2016-05-25 LAB — MICROSCOPIC EXAMINATION

## 2016-05-25 MED ORDER — SULFAMETHOXAZOLE-TRIMETHOPRIM 800-160 MG PO TABS: 1.0000 | ORAL_TABLET | Freq: Two times a day (BID) | ORAL | 0 refills | Status: DC

## 2016-05-25 NOTE — Telephone Encounter (Signed)
Daughter wants to be sure her mother (Amanda Hubbard), does not have to go all week end with out an antibiotic for a bladder infection.   Please have provider review results and refill medication if appropriate.

## 2016-05-25 NOTE — Telephone Encounter (Signed)
Pt notified of RX 

## 2016-05-25 NOTE — Telephone Encounter (Signed)
Daughter Hassan Rowan) came in to inform that pt continues to have cloudy, strong smelling urine.  Requesting refill on antibiotic. Specimen cup given and order entered in Epic. Okayed per Dr Livia Snellen

## 2016-05-28 ENCOUNTER — Telehealth: Payer: Self-pay | Admitting: Family Medicine

## 2016-05-28 DIAGNOSIS — R627 Adult failure to thrive: Secondary | ICD-10-CM

## 2016-05-29 ENCOUNTER — Telehealth: Payer: Self-pay | Admitting: Family Medicine

## 2016-05-29 NOTE — Telephone Encounter (Signed)
Orders placed and out to provider to sign

## 2016-05-29 NOTE — Telephone Encounter (Signed)
disregard

## 2016-05-30 ENCOUNTER — Other Ambulatory Visit: Payer: Self-pay | Admitting: Family Medicine

## 2016-05-30 ENCOUNTER — Ambulatory Visit: Payer: Self-pay | Admitting: Family Medicine

## 2016-05-30 LAB — URINE CULTURE

## 2016-05-30 MED ORDER — CIPROFLOXACIN HCL 500 MG PO TABS: 500.0000 mg | ORAL_TABLET | Freq: Two times a day (BID) | ORAL | 0 refills | Status: DC

## 2016-05-31 NOTE — Telephone Encounter (Signed)
Dr signed orders and have been faxed. Samridhi Adamek is aware.

## 2016-06-05 ENCOUNTER — Ambulatory Visit (INDEPENDENT_AMBULATORY_CARE_PROVIDER_SITE_OTHER): Payer: PPO

## 2016-06-05 ENCOUNTER — Ambulatory Visit (INDEPENDENT_AMBULATORY_CARE_PROVIDER_SITE_OTHER): Payer: PPO | Admitting: Family Medicine

## 2016-06-05 ENCOUNTER — Encounter: Payer: Self-pay | Admitting: Family Medicine

## 2016-06-05 VITALS — BP 125/68 | HR 117 | Temp 98.3°F | Ht 63.0 in | Wt 108.0 lb

## 2016-06-05 DIAGNOSIS — D692 Other nonthrombocytopenic purpura: Secondary | ICD-10-CM

## 2016-06-05 DIAGNOSIS — I2510 Atherosclerotic heart disease of native coronary artery without angina pectoris: Secondary | ICD-10-CM | POA: Diagnosis not present

## 2016-06-05 DIAGNOSIS — R1011 Right upper quadrant pain: Secondary | ICD-10-CM | POA: Diagnosis not present

## 2016-06-05 DIAGNOSIS — G894 Chronic pain syndrome: Secondary | ICD-10-CM | POA: Diagnosis not present

## 2016-06-05 DIAGNOSIS — R531 Weakness: Secondary | ICD-10-CM | POA: Diagnosis not present

## 2016-06-05 DIAGNOSIS — Z466 Encounter for fitting and adjustment of urinary device: Secondary | ICD-10-CM | POA: Diagnosis not present

## 2016-06-05 DIAGNOSIS — R627 Adult failure to thrive: Secondary | ICD-10-CM

## 2016-06-05 DIAGNOSIS — Z7952 Long term (current) use of systemic steroids: Secondary | ICD-10-CM | POA: Diagnosis not present

## 2016-06-05 DIAGNOSIS — T83510A Infection and inflammatory reaction due to cystostomy catheter, initial encounter: Secondary | ICD-10-CM | POA: Diagnosis not present

## 2016-06-05 DIAGNOSIS — L989 Disorder of the skin and subcutaneous tissue, unspecified: Secondary | ICD-10-CM

## 2016-06-05 DIAGNOSIS — E559 Vitamin D deficiency, unspecified: Secondary | ICD-10-CM

## 2016-06-05 DIAGNOSIS — T83511A Infection and inflammatory reaction due to indwelling urethral catheter, initial encounter: Secondary | ICD-10-CM

## 2016-06-05 DIAGNOSIS — N390 Urinary tract infection, site not specified: Secondary | ICD-10-CM | POA: Diagnosis not present

## 2016-06-05 DIAGNOSIS — R5383 Other fatigue: Secondary | ICD-10-CM

## 2016-06-05 DIAGNOSIS — T83511D Infection and inflammatory reaction due to indwelling urethral catheter, subsequent encounter: Secondary | ICD-10-CM | POA: Diagnosis not present

## 2016-06-05 DIAGNOSIS — M545 Low back pain: Secondary | ICD-10-CM | POA: Diagnosis not present

## 2016-06-05 DIAGNOSIS — E46 Unspecified protein-calorie malnutrition: Secondary | ICD-10-CM | POA: Diagnosis not present

## 2016-06-05 DIAGNOSIS — I10 Essential (primary) hypertension: Secondary | ICD-10-CM

## 2016-06-05 DIAGNOSIS — F329 Major depressive disorder, single episode, unspecified: Secondary | ICD-10-CM | POA: Diagnosis not present

## 2016-06-05 DIAGNOSIS — F32A Depression, unspecified: Secondary | ICD-10-CM

## 2016-06-05 DIAGNOSIS — F419 Anxiety disorder, unspecified: Secondary | ICD-10-CM | POA: Diagnosis not present

## 2016-06-05 DIAGNOSIS — Z85528 Personal history of other malignant neoplasm of kidney: Secondary | ICD-10-CM | POA: Diagnosis not present

## 2016-06-05 MED ORDER — FENTANYL 50 MCG/HR TD PT72: 50.0000 ug | MEDICATED_PATCH | TRANSDERMAL | 0 refills | Status: DC

## 2016-06-05 NOTE — Addendum Note (Signed)
Addended by: Zannie Cove on: 06/05/2016 05:08 PM   Modules accepted: Orders

## 2016-06-05 NOTE — Patient Instructions (Addendum)
Medicare Annual Wellness Visit  [facility] and the medical providers at [facility] strive to bring you the best medical care.  In doing so we not only want to address your current medical conditions and concerns but also to detect new conditions early and prevent illness, disease and health-related problems.    Medicare offers a yearly Wellness Visit which allows our clinical staff to assess your need for preventative services including immunizations, lifestyle education, counseling to decrease risk of preventable diseases and screening for fall risk and other medical concerns.    This visit is provided free of charge (no copay) for all Medicare recipients. The clinical pharmacists at [facility] have begun to conduct these Wellness Visits which will also include a thorough review of all your medications.    As you primary medical provider recommend that you make an appointment for your Annual Wellness Visit if you have not done so already this year.  You may set up this appointment before you leave today or you may call back [phone]) and schedule an appointment.  Please make sure when you call that you mention that you are scheduling your Annual Wellness Visit with the clinical pharmacist so that the appointment may be made for the proper length of time.     Continue current medications. Continue good therapeutic lifestyle changes which include good diet and exercise. Fall precautions discussed with patient. If an FOBT was given today- please return it to our front desk. If you are over 51 years old - you may need Prevnar 51 or the adult Pneumonia vaccine.   After your visit with [place] today you will receive a survey in the mail or online from [employer] regarding your care with [place]. Please take a moment to fill this out. Your feedback is very important to [place] as you can help [place] better understand your patient needs as well as  improve your experience and satisfaction. WE CARE ABOUT YOU!!!   We will refer the patient to a dermatologist to have the skin lesion of her right cheek removed We encourage the patient to get up and move with assistance and to not stay in the bed as long as she's been staying in the bed We will continue with the extra strength Tylenol and the fentanyl patch as we're currently doing She needs to drink plenty of fluids and stay well hydrated We'll call with the urine results and the blood work results as soon as those results become available Debrox is available over-the-counter and instill 3-4 drops in each ear for 3 days in a row wait 1 week and repeat this and have home health nurse irrigate the ear canal in a couple weeks on each side to help remove the cerumen that is softened by the Debrox

## 2016-06-05 NOTE — Progress Notes (Addendum)
Subjective:    Patient ID: Amanda Hubbard, female    DOB: 08/12/1928, 82 y.o.   MRN: [id number]  HPI Pt here for follow up and management of chronic medical problems which includes hyperlipidemia, and hypertension. Pt takes medications regularly. The patient complains of fatigue and abdominal pain and back pain. The abdominal pain is been going on for years and she's had multiple x-rays. She was on hospice 4. Time but is back on home health care. She's had a recent urinary tract infection and as usual refuses to do any health maintenance issues. She will get a chest x-ray and will be given an FOBT to return and get lab work today. The patient comes today with her caregiver and her son and her husband. She was on hospice for 3 months and they discharged her and she is back to home health. Several orders were signed for supplies with home health. The patient wanted to increase her fentanyl patch but we discussed not doing that and keeping her with exercise strength Tylenol. She does need a new prescription for the fentanyl patch 50 mg every 72 hours. She denies any chest pain or shortness of breath. The caregiver says she has a good appetite and that her bowels are moving well. She does have a Foley catheter in this was inserted because she was getting up from the bed to go to the bathroom a lot and falling. She had a recent urinary tract infection is currently on Cipro and has not completed this. We will check a urine while she is in the building today. I encouraged her to be more active and to get out of the bed more so she would not be so weak. She does have a facial lesion that will need removal by the dermatologist we will arrange for this to be done.  Patient Active Problem List   Diagnosis Date Noted  . Urinary tract infectious disease 05/16/2016  . Senile purpura ([place]) 02/22/2015  . DYSLIPIDEMIA 12/21/2008  . Essential hypertension 12/21/2008  . Coronary atherosclerosis 12/21/2008  . Mitral  valve disorder 12/21/2008  . OSTEOPENIA 12/21/2008  . INSOMNIA 12/21/2008   Outpatient Encounter Prescriptions as of 06/05/2016  Medication Sig  . acetaminophen (ACETAMINOPHEN 8 HOUR) 650 MG CR tablet Take 650 mg by mouth every 8 (eight) hours as needed for pain.  Marland Kitchen amLODipine (NORVASC) 5 MG tablet Take 1 tablet (5 mg total) by mouth daily.  . ciprofloxacin (CIPRO) 500 MG tablet Take 1 tablet (500 mg total) by mouth 2 (two) times daily.  . fentaNYL (DURAGESIC - DOSED MCG/HR) 50 MCG/HR Place 1 patch (50 mcg total) onto the skin every 3 (three) days.  Marland Kitchen gabapentin (NEURONTIN) 300 MG capsule Take 1 capsule (300 mg total) by mouth 3 (three) times daily.  Marland Kitchen LORazepam (ATIVAN) 2 MG tablet Take 1 tablet (2 mg total) by mouth at bedtime.  . phenazopyridine (PYRIDIUM) 100 MG tablet Take 1 tablet (100 mg total) by mouth 3 (three) times daily with meals. For 2 days  . predniSONE (DELTASONE) 20 MG tablet Take 1 tablet (20 mg total) by mouth daily with breakfast.  . SENEXON-S 8.6-50 MG tablet TAKE 4 TABLETS BY MOUTH 3 TIMES A DAY UNTIL BOWEL MOVEMENT.  Marland Kitchen temazepam (RESTORIL) 30 MG capsule Take 1 capsule (30 mg total) by mouth at bedtime.   No facility-administered encounter medications on file as of 06/05/2016.        Review of Systems  Constitutional: Positive for fatigue.  HENT:  Negative.   Eyes: Negative.   Respiratory: Negative.   Cardiovascular: Negative.   Gastrointestinal: Positive for abdominal pain.  Endocrine: Negative.   Genitourinary: Negative.   Musculoskeletal: Positive for back pain.  Skin: Negative.   Allergic/Immunologic: Negative.   Neurological: Positive for weakness.  Hematological: Negative.   Psychiatric/Behavioral: Negative.        Objective:   Physical Exam  Constitutional: She is oriented to person, place, and time.  Patient is elderly and looks much older than her stated age. She is alert and understands what we're saying were talking with her in the room. She is  very depressed and says she doesn't want to live and she's been this way for many many years.  HENT:  Head: Normocephalic and atraumatic.  Nose: Nose normal.  Mouth/Throat: Oropharynx is clear and moist.  Bilateral ears cerumen  Eyes: Conjunctivae and EOM are normal. Pupils are equal, round, and reactive to light. Right eye exhibits no discharge. Left eye exhibits no discharge. No scleral icterus.  Neck: Normal range of motion. Neck supple. No thyromegaly present.  Cardiovascular: Normal rate, regular rhythm and normal heart sounds.   No murmur heard. Heart has a regular rate and rhythm today at 96/m  Pulmonary/Chest: Effort normal and breath sounds normal. No respiratory distress. She has no wheezes. She has no rales. She exhibits no tenderness.  No wheezes or rhonchi. Slightly decreased breath sounds bilaterally  Abdominal: Soft. Bowel sounds are normal. She exhibits no mass. There is no tenderness. There is no rebound and no guarding.  The abdomen had no apparent masses palpable and there was no tenderness nor suprapubic tenderness.  Musculoskeletal: She exhibits no edema.  The patient was in a wheelchair.  Lymphadenopathy:    She has no cervical adenopathy.  Neurological: She is alert and oriented to person, place, and time. She has normal reflexes. No cranial nerve deficit.  Skin: Skin is warm and dry. No rash noted.  Has a skin lesion which could be an early skin cancer on her right cheek and we will refer her to a dermatologist to have this removed. The patient has multiple areas of bruising on both arms and anterior legs  Psychiatric: She has a normal mood and affect. Her behavior is normal. Thought content normal.  Nursing note and vitals reviewed.  BP 125/68 (BP Location: Right Arm, Patient Position: Sitting)   Pulse (!) 117   Temp 98.3 F (36.8 C) (Oral)   Ht 5' 3" (1.6 m)   Wt 108 lb (49 kg)   BMI 19.13 kg/m       Assessment & Plan:  1. Essential hypertension -The  blood pressure is good today and she will continue with current treatment - BMP8+EGFR - CBC with Differential/Platelet - Lipid panel - Hepatic function panel - DG Chest 2 View; Future  2. Vitamin D deficiency -Continue with current treatment pending results of lab work - CBC with Differential/Platelet - VITAMIN D 25 Hydroxy (Vit-D Deficiency, Fractures)  3. ASCVD (arteriosclerotic cardiovascular disease) -Continue with as aggressive therapeutic lifestyle changes as possible. The patient has refused in the past to do anything the cardiologist wanted to do to help her and she has no desire to want to do anything more aggressively. - CBC with Differential/Platelet - DG Chest 2 View; Future  4. FTT (failure to thrive) in adult -Continue with supportive care at home and good nourishment - CBC with Differential/Platelet - DME Wheelchair manual - DME Hospital bed - DG Chest 2 View;  Future  5. Abdominal pain, right upper quadrant -Continue drinking fluids and with good eating habits - CBC with Differential/Platelet - DME Wheelchair manual - DME Hospital bed - fentaNYL (DURAGESIC - DOSED MCG/HR) 50 MCG/HR; Place 1 patch (50 mcg total) onto the skin every 3 (three) days.  Dispense: 10 patch; Refill: 0  6. Urinary tract infection associated with catheterization of urinary tract, initial encounter -The patient still has some more Cipro to complete - CBC with Differential/Platelet - Urinalysis, Complete - Urine culture  7. Skin lesion of face -Referred to dermatology - CBC with Differential/Platelet  8. Low back pain, unspecified back pain laterality, with sciatica presence unspecified -Continue fentanyl patch and Tylenol as doing - DME Wheelchair manual - DME Hospital bed  9. Weakness generalized -She will try to get up and exercise more and get out of the bed more so she does not get so weak. The caregiver understands to try to get her up more. - DME Wheelchair manual - DME  Hospital bed - Urinalysis, Complete - Urine culture  10. Other fatigue -Check labs and try to increase activity - DME Wheelchair manual - DME Hospital bed - DG Chest 2 View; Future - Urinalysis, Complete - Urine culture  11. Depression -The patient has been chronically depressed for years.  12. Chronic pain syndrome -Continue fentanyl patch and Tylenol  13. Senile purpura  Meds ordered this encounter  Medications  . fentaNYL (DURAGESIC - DOSED MCG/HR) 50 MCG/HR    Sig: Place 1 patch (50 mcg total) onto the skin every 3 (three) days.    Dispense:  10 patch    Refill:  0   Patient Instructions                       Medicare Annual Wellness Visit  [facility] and the medical providers at [facility] strive to bring you the best medical care.  In doing so we not only want to address your current medical conditions and concerns but also to detect new conditions early and prevent illness, disease and health-related problems.    Medicare offers a yearly Wellness Visit which allows our clinical staff to assess your need for preventative services including immunizations, lifestyle education, counseling to decrease risk of preventable diseases and screening for fall risk and other medical concerns.    This visit is provided free of charge (no copay) for all Medicare recipients. The clinical pharmacists at [facility] have begun to conduct these Wellness Visits which will also include a thorough review of all your medications.    As you primary medical provider recommend that you make an appointment for your Annual Wellness Visit if you have not done so already this year.  You may set up this appointment before you leave today or you may call back ([phone]) and schedule an appointment.  Please make sure when you call that you mention that you are scheduling your Annual Wellness Visit with the clinical pharmacist so that the appointment may be  made for the proper length of time.     Continue current medications. Continue good therapeutic lifestyle changes which include good diet and exercise. Fall precautions discussed with patient. If an FOBT was given today- please return it to our front desk. If you are over 25 years old - you may need Prevnar 1 or the adult Pneumonia vaccine.   After your visit with [place] today you will receive a survey in the mail  or online from [employer] regarding your care with [place]. Please take a moment to fill this out. Your feedback is very important to [place] as you can help [place] better understand your patient needs as well as improve your experience and satisfaction. WE CARE ABOUT YOU!!!   We will refer the patient to a dermatologist to have the skin lesion of her right cheek removed We encourage the patient to get up and move with assistance and to not stay in the bed as long as she's been staying in the bed We will continue with the extra strength Tylenol and the fentanyl patch as we're currently doing She needs to drink plenty of fluids and stay well hydrated We'll call with the urine results and the blood work results as soon as those results become available Debrox is available over-the-counter and instill 3-4 drops in each ear for 3 days in a row wait 1 week and repeat this and have home health nurse irrigate the ear canal in a couple weeks on each side to help remove the cerumen that is softened by the Debrox    Arrie Senate MD

## 2016-06-06 LAB — CBC WITH DIFFERENTIAL/PLATELET
BASOS ABS: 0 10*3/uL (ref 0.0–0.2)
Basos: 0 %
EOS (ABSOLUTE): 0.2 10*3/uL (ref 0.0–0.4)
Eos: 1 %
HEMATOCRIT: 32.3 % — AB (ref 34.0–46.6)
Hemoglobin: 10.4 g/dL — ABNORMAL LOW (ref 11.1–15.9)
IMMATURE GRANULOCYTES: 2 %
Immature Grans (Abs): 0.3 10*3/uL — ABNORMAL HIGH (ref 0.0–0.1)
LYMPHS ABS: 1.8 10*3/uL (ref 0.7–3.1)
Lymphs: 11 %
MCH: 32.3 pg (ref 26.6–33.0)
MCHC: 32.2 g/dL (ref 31.5–35.7)
MCV: 100 fL — ABNORMAL HIGH (ref 79–97)
MONOS ABS: 0.8 10*3/uL (ref 0.1–0.9)
Monocytes: 5 %
NEUTROS PCT: 81 %
Neutrophils Absolute: 12.2 10*3/uL — ABNORMAL HIGH (ref 1.4–7.0)
PLATELETS: 388 10*3/uL — AB (ref 150–379)
RBC: 3.22 x10E6/uL — ABNORMAL LOW (ref 3.77–5.28)
RDW: 16 % — AB (ref 12.3–15.4)
WBC: 15.3 10*3/uL — AB (ref 3.4–10.8)

## 2016-06-06 LAB — VITAMIN D 25 HYDROXY (VIT D DEFICIENCY, FRACTURES): Vit D, 25-Hydroxy: 6.3 ng/mL — ABNORMAL LOW (ref 30.0–100.0)

## 2016-06-06 LAB — BMP8+EGFR
BUN / CREAT RATIO: 24 (ref 12–28)
BUN: 26 mg/dL (ref 8–27)
CO2: 23 mmol/L (ref 18–29)
CREATININE: 1.09 mg/dL — AB (ref 0.57–1.00)
Calcium: 8.7 mg/dL (ref 8.7–10.3)
Chloride: 101 mmol/L (ref 96–106)
GFR, EST AFRICAN AMERICAN: 53 mL/min/{1.73_m2} — AB (ref >59)
GFR, EST NON AFRICAN AMERICAN: 46 mL/min/{1.73_m2} — AB (ref >59)
Glucose: 142 mg/dL — ABNORMAL HIGH (ref 65–99)
POTASSIUM: 4.4 mmol/L (ref 3.5–5.2)
SODIUM: 142 mmol/L (ref 134–144)

## 2016-06-06 LAB — HEPATIC FUNCTION PANEL
ALK PHOS: 50 IU/L (ref 39–117)
ALT: 26 IU/L (ref 0–32)
AST: 17 IU/L (ref 0–40)
Albumin: 3.4 g/dL — ABNORMAL LOW (ref 3.5–4.7)
BILIRUBIN TOTAL: 0.7 mg/dL (ref 0.0–1.2)
BILIRUBIN, DIRECT: 0.12 mg/dL (ref 0.00–0.40)
Total Protein: 5.8 g/dL — ABNORMAL LOW (ref 6.0–8.5)

## 2016-06-06 LAB — LIPID PANEL
CHOL/HDL RATIO: 4.7 ratio — AB (ref 0.0–4.4)
CHOLESTEROL TOTAL: 303 mg/dL — AB (ref 100–199)
HDL: 64 mg/dL (ref >39)
TRIGLYCERIDES: 514 mg/dL — AB (ref 0–149)

## 2016-06-07 ENCOUNTER — Telehealth: Payer: Self-pay | Admitting: Family Medicine

## 2016-06-07 LAB — IRON AND TIBC
Iron Saturation: 26 % (ref 15–55)
Iron: 72 ug/dL (ref 27–139)
TIBC: 279 ug/dL (ref 250–450)
UIBC: 207 ug/dL (ref 118–369)

## 2016-06-07 LAB — SPECIMEN STATUS REPORT

## 2016-06-07 LAB — FERRITIN: FERRITIN: 305 ng/mL — AB (ref 15–150)

## 2016-06-07 LAB — VITAMIN B12: Vitamin B-12: 312 pg/mL (ref 211–946)

## 2016-06-07 NOTE — Telephone Encounter (Signed)
LM [id number]-jhb

## 2016-06-08 ENCOUNTER — Other Ambulatory Visit: Payer: Self-pay | Admitting: *Deleted

## 2016-06-08 ENCOUNTER — Other Ambulatory Visit: Payer: PPO

## 2016-06-08 ENCOUNTER — Telehealth: Payer: Self-pay | Admitting: Nurse Practitioner

## 2016-06-08 DIAGNOSIS — Z466 Encounter for fitting and adjustment of urinary device: Secondary | ICD-10-CM | POA: Diagnosis not present

## 2016-06-08 DIAGNOSIS — T83518A Infection and inflammatory reaction due to other urinary catheter, initial encounter: Secondary | ICD-10-CM | POA: Diagnosis not present

## 2016-06-08 DIAGNOSIS — T83511A Infection and inflammatory reaction due to indwelling urethral catheter, initial encounter: Principal | ICD-10-CM

## 2016-06-08 DIAGNOSIS — N3000 Acute cystitis without hematuria: Secondary | ICD-10-CM

## 2016-06-08 DIAGNOSIS — F419 Anxiety disorder, unspecified: Secondary | ICD-10-CM | POA: Diagnosis not present

## 2016-06-08 DIAGNOSIS — Z85528 Personal history of other malignant neoplasm of kidney: Secondary | ICD-10-CM | POA: Diagnosis not present

## 2016-06-08 DIAGNOSIS — N390 Urinary tract infection, site not specified: Secondary | ICD-10-CM

## 2016-06-08 DIAGNOSIS — E46 Unspecified protein-calorie malnutrition: Secondary | ICD-10-CM | POA: Diagnosis not present

## 2016-06-08 DIAGNOSIS — I10 Essential (primary) hypertension: Secondary | ICD-10-CM | POA: Diagnosis not present

## 2016-06-08 DIAGNOSIS — R627 Adult failure to thrive: Secondary | ICD-10-CM | POA: Diagnosis not present

## 2016-06-08 DIAGNOSIS — Z7952 Long term (current) use of systemic steroids: Secondary | ICD-10-CM | POA: Diagnosis not present

## 2016-06-08 DIAGNOSIS — R710 Precipitous drop in hematocrit: Secondary | ICD-10-CM

## 2016-06-08 DIAGNOSIS — R1011 Right upper quadrant pain: Secondary | ICD-10-CM | POA: Diagnosis not present

## 2016-06-08 LAB — MICROSCOPIC EXAMINATION

## 2016-06-08 LAB — URINALYSIS, COMPLETE
Bilirubin, UA: NEGATIVE
GLUCOSE, UA: NEGATIVE
Ketones, UA: NEGATIVE
Nitrite, UA: POSITIVE — AB
PROTEIN UA: NEGATIVE
RBC, UA: NEGATIVE
SPEC GRAV UA: 1.015 (ref 1.005–1.030)
Urobilinogen, Ur: 0.2 mg/dL (ref 0.2–1.0)
pH, UA: 7 (ref 5.0–7.5)

## 2016-06-08 MED ORDER — LORAZEPAM 1 MG PO TABS: 1.0000 mg | ORAL_TABLET | Freq: Two times a day (BID) | ORAL | 1 refills | Status: DC

## 2016-06-08 MED ORDER — SULFAMETHOXAZOLE-TRIMETHOPRIM 800-160 MG PO TABS: 1.0000 | ORAL_TABLET | Freq: Two times a day (BID) | ORAL | 0 refills | Status: DC

## 2016-06-08 NOTE — Telephone Encounter (Signed)
[facility]ivan [facility]

## 2016-06-08 NOTE — Telephone Encounter (Signed)
Patient has UTI still- stop cirpo- not working changed to bactrim - rx sent to pharmacy Urine culture ordered.

## 2016-06-10 LAB — URINE CULTURE

## 2016-06-11 ENCOUNTER — Other Ambulatory Visit: Payer: Self-pay | Admitting: *Deleted

## 2016-06-11 DIAGNOSIS — T83511A Infection and inflammatory reaction due to indwelling urethral catheter, initial encounter: Principal | ICD-10-CM

## 2016-06-11 DIAGNOSIS — N390 Urinary tract infection, site not specified: Secondary | ICD-10-CM

## 2016-06-13 ENCOUNTER — Other Ambulatory Visit: Payer: PPO

## 2016-06-13 DIAGNOSIS — R7989 Other specified abnormal findings of blood chemistry: Secondary | ICD-10-CM

## 2016-06-13 DIAGNOSIS — R748 Abnormal levels of other serum enzymes: Secondary | ICD-10-CM | POA: Diagnosis not present

## 2016-06-14 ENCOUNTER — Telehealth: Payer: Self-pay | Admitting: Family Medicine

## 2016-06-14 DIAGNOSIS — C44519 Basal cell carcinoma of skin of other part of trunk: Secondary | ICD-10-CM | POA: Diagnosis not present

## 2016-06-14 DIAGNOSIS — B078 Other viral warts: Secondary | ICD-10-CM | POA: Diagnosis not present

## 2016-06-14 DIAGNOSIS — L570 Actinic keratosis: Secondary | ICD-10-CM | POA: Diagnosis not present

## 2016-06-14 DIAGNOSIS — X32XXXD Exposure to sunlight, subsequent encounter: Secondary | ICD-10-CM | POA: Diagnosis not present

## 2016-06-14 LAB — BMP8+EGFR
BUN/Creatinine Ratio: 22 (ref 12–28)
BUN: 29 mg/dL — ABNORMAL HIGH (ref 8–27)
CO2: 23 mmol/L (ref 18–29)
CREATININE: 1.33 mg/dL — AB (ref 0.57–1.00)
Calcium: 8.6 mg/dL — ABNORMAL LOW (ref 8.7–10.3)
Chloride: 100 mmol/L (ref 96–106)
GFR, EST AFRICAN AMERICAN: 42 mL/min/{1.73_m2} — AB (ref >59)
GFR, EST NON AFRICAN AMERICAN: 36 mL/min/{1.73_m2} — AB (ref >59)
Glucose: 165 mg/dL — ABNORMAL HIGH (ref 65–99)
Potassium: 4.4 mmol/L (ref 3.5–5.2)
SODIUM: 139 mmol/L (ref 134–144)

## 2016-06-14 NOTE — Telephone Encounter (Signed)
Patient had lab repeated.

## 2016-06-14 NOTE — Telephone Encounter (Signed)
Reviewed labs with pt's care taker.

## 2016-06-18 ENCOUNTER — Other Ambulatory Visit: Payer: Self-pay | Admitting: Family Medicine

## 2016-06-18 ENCOUNTER — Other Ambulatory Visit: Payer: Self-pay

## 2016-06-18 DIAGNOSIS — R627 Adult failure to thrive: Secondary | ICD-10-CM

## 2016-06-18 NOTE — Telephone Encounter (Signed)
Covering for PCP.   Pt on several high risk medications including ativan, fentanyl, and arestoril.   Needs to be seen for refills.   Laroy Apple, MD [facility] 06/18/2016, 12:58 PM

## 2016-06-21 DIAGNOSIS — F419 Anxiety disorder, unspecified: Secondary | ICD-10-CM | POA: Diagnosis not present

## 2016-06-21 DIAGNOSIS — E46 Unspecified protein-calorie malnutrition: Secondary | ICD-10-CM | POA: Diagnosis not present

## 2016-06-21 DIAGNOSIS — I10 Essential (primary) hypertension: Secondary | ICD-10-CM | POA: Diagnosis not present

## 2016-06-21 DIAGNOSIS — Z7952 Long term (current) use of systemic steroids: Secondary | ICD-10-CM | POA: Diagnosis not present

## 2016-06-21 DIAGNOSIS — Z85528 Personal history of other malignant neoplasm of kidney: Secondary | ICD-10-CM | POA: Diagnosis not present

## 2016-06-21 DIAGNOSIS — Z466 Encounter for fitting and adjustment of urinary device: Secondary | ICD-10-CM | POA: Diagnosis not present

## 2016-06-22 ENCOUNTER — Other Ambulatory Visit: Payer: Self-pay

## 2016-06-22 DIAGNOSIS — I10 Essential (primary) hypertension: Secondary | ICD-10-CM | POA: Diagnosis not present

## 2016-06-22 DIAGNOSIS — F419 Anxiety disorder, unspecified: Secondary | ICD-10-CM | POA: Diagnosis not present

## 2016-06-22 DIAGNOSIS — E46 Unspecified protein-calorie malnutrition: Secondary | ICD-10-CM | POA: Diagnosis not present

## 2016-06-22 MED ORDER — TEMAZEPAM 30 MG PO CAPS: 30.0000 mg | ORAL_CAPSULE | Freq: Every day | ORAL | 1 refills | Status: DC

## 2016-06-28 DIAGNOSIS — Z7952 Long term (current) use of systemic steroids: Secondary | ICD-10-CM | POA: Diagnosis not present

## 2016-06-28 DIAGNOSIS — F419 Anxiety disorder, unspecified: Secondary | ICD-10-CM | POA: Diagnosis not present

## 2016-06-28 DIAGNOSIS — Z466 Encounter for fitting and adjustment of urinary device: Secondary | ICD-10-CM | POA: Diagnosis not present

## 2016-06-28 DIAGNOSIS — E46 Unspecified protein-calorie malnutrition: Secondary | ICD-10-CM | POA: Diagnosis not present

## 2016-06-28 DIAGNOSIS — Z85528 Personal history of other malignant neoplasm of kidney: Secondary | ICD-10-CM | POA: Diagnosis not present

## 2016-06-28 DIAGNOSIS — I10 Essential (primary) hypertension: Secondary | ICD-10-CM | POA: Diagnosis not present

## 2016-06-30 ENCOUNTER — Ambulatory Visit (INDEPENDENT_AMBULATORY_CARE_PROVIDER_SITE_OTHER): Payer: PPO | Admitting: Family Medicine

## 2016-06-30 DIAGNOSIS — Z466 Encounter for fitting and adjustment of urinary device: Secondary | ICD-10-CM

## 2016-06-30 DIAGNOSIS — F419 Anxiety disorder, unspecified: Secondary | ICD-10-CM | POA: Diagnosis not present

## 2016-06-30 DIAGNOSIS — E46 Unspecified protein-calorie malnutrition: Secondary | ICD-10-CM

## 2016-06-30 DIAGNOSIS — I10 Essential (primary) hypertension: Secondary | ICD-10-CM

## 2016-07-02 DIAGNOSIS — F419 Anxiety disorder, unspecified: Secondary | ICD-10-CM | POA: Diagnosis not present

## 2016-07-02 DIAGNOSIS — E46 Unspecified protein-calorie malnutrition: Secondary | ICD-10-CM | POA: Diagnosis not present

## 2016-07-02 DIAGNOSIS — Z85528 Personal history of other malignant neoplasm of kidney: Secondary | ICD-10-CM | POA: Diagnosis not present

## 2016-07-02 DIAGNOSIS — Z7952 Long term (current) use of systemic steroids: Secondary | ICD-10-CM | POA: Diagnosis not present

## 2016-07-02 DIAGNOSIS — Z466 Encounter for fitting and adjustment of urinary device: Secondary | ICD-10-CM | POA: Diagnosis not present

## 2016-07-02 DIAGNOSIS — I10 Essential (primary) hypertension: Secondary | ICD-10-CM | POA: Diagnosis not present

## 2016-07-05 ENCOUNTER — Other Ambulatory Visit: Payer: Self-pay

## 2016-07-05 DIAGNOSIS — R1011 Right upper quadrant pain: Secondary | ICD-10-CM

## 2016-07-05 MED ORDER — FENTANYL 50 MCG/HR TD PT72: 50.0000 ug | MEDICATED_PATCH | TRANSDERMAL | 0 refills | Status: DC

## 2016-07-08 DIAGNOSIS — R627 Adult failure to thrive: Secondary | ICD-10-CM | POA: Diagnosis not present

## 2016-07-08 DIAGNOSIS — R1011 Right upper quadrant pain: Secondary | ICD-10-CM | POA: Diagnosis not present

## 2016-07-09 DIAGNOSIS — G894 Chronic pain syndrome: Secondary | ICD-10-CM | POA: Diagnosis not present

## 2016-07-09 DIAGNOSIS — F05 Delirium due to known physiological condition: Secondary | ICD-10-CM | POA: Diagnosis not present

## 2016-07-09 DIAGNOSIS — S299XXA Unspecified injury of thorax, initial encounter: Secondary | ICD-10-CM | POA: Diagnosis not present

## 2016-07-09 DIAGNOSIS — I63412 Cerebral infarction due to embolism of left middle cerebral artery: Secondary | ICD-10-CM | POA: Diagnosis not present

## 2016-07-09 DIAGNOSIS — M546 Pain in thoracic spine: Secondary | ICD-10-CM | POA: Diagnosis not present

## 2016-07-09 DIAGNOSIS — E870 Hyperosmolality and hypernatremia: Secondary | ICD-10-CM | POA: Diagnosis not present

## 2016-07-09 DIAGNOSIS — R109 Unspecified abdominal pain: Secondary | ICD-10-CM | POA: Diagnosis not present

## 2016-07-09 DIAGNOSIS — R410 Disorientation, unspecified: Secondary | ICD-10-CM | POA: Diagnosis not present

## 2016-07-09 DIAGNOSIS — S0990XA Unspecified injury of head, initial encounter: Secondary | ICD-10-CM | POA: Diagnosis not present

## 2016-07-09 DIAGNOSIS — Z880 Allergy status to penicillin: Secondary | ICD-10-CM | POA: Diagnosis not present

## 2016-07-09 DIAGNOSIS — Z79899 Other long term (current) drug therapy: Secondary | ICD-10-CM | POA: Diagnosis not present

## 2016-07-09 DIAGNOSIS — I639 Cerebral infarction, unspecified: Secondary | ICD-10-CM | POA: Diagnosis not present

## 2016-07-09 DIAGNOSIS — R404 Transient alteration of awareness: Secondary | ICD-10-CM | POA: Diagnosis not present

## 2016-07-09 DIAGNOSIS — F0390 Unspecified dementia without behavioral disturbance: Secondary | ICD-10-CM | POA: Diagnosis not present

## 2016-07-09 DIAGNOSIS — I10 Essential (primary) hypertension: Secondary | ICD-10-CM | POA: Diagnosis not present

## 2016-07-09 DIAGNOSIS — N390 Urinary tract infection, site not specified: Secondary | ICD-10-CM | POA: Diagnosis not present

## 2016-07-09 DIAGNOSIS — Z66 Do not resuscitate: Secondary | ICD-10-CM | POA: Diagnosis not present

## 2016-07-09 DIAGNOSIS — R531 Weakness: Secondary | ICD-10-CM | POA: Diagnosis not present

## 2016-07-09 DIAGNOSIS — Z79891 Long term (current) use of opiate analgesic: Secondary | ICD-10-CM | POA: Diagnosis not present

## 2016-07-09 DIAGNOSIS — R4182 Altered mental status, unspecified: Secondary | ICD-10-CM | POA: Diagnosis not present

## 2016-07-09 DIAGNOSIS — S199XXA Unspecified injury of neck, initial encounter: Secondary | ICD-10-CM | POA: Diagnosis not present

## 2016-07-09 DIAGNOSIS — E860 Dehydration: Secondary | ICD-10-CM | POA: Diagnosis not present

## 2016-07-09 DIAGNOSIS — B957 Other staphylococcus as the cause of diseases classified elsewhere: Secondary | ICD-10-CM | POA: Diagnosis not present

## 2016-07-10 DIAGNOSIS — F05 Delirium due to known physiological condition: Secondary | ICD-10-CM | POA: Diagnosis not present

## 2016-07-10 DIAGNOSIS — I639 Cerebral infarction, unspecified: Secondary | ICD-10-CM | POA: Diagnosis not present

## 2016-07-10 DIAGNOSIS — R4182 Altered mental status, unspecified: Secondary | ICD-10-CM | POA: Diagnosis not present

## 2016-07-10 DIAGNOSIS — E870 Hyperosmolality and hypernatremia: Secondary | ICD-10-CM | POA: Diagnosis not present

## 2016-07-10 DIAGNOSIS — N390 Urinary tract infection, site not specified: Secondary | ICD-10-CM | POA: Diagnosis not present

## 2016-07-11 DIAGNOSIS — N390 Urinary tract infection, site not specified: Secondary | ICD-10-CM | POA: Diagnosis not present

## 2016-07-11 DIAGNOSIS — F05 Delirium due to known physiological condition: Secondary | ICD-10-CM | POA: Diagnosis not present

## 2016-07-12 DIAGNOSIS — N390 Urinary tract infection, site not specified: Secondary | ICD-10-CM | POA: Diagnosis not present

## 2016-07-13 DIAGNOSIS — F0280 Dementia in other diseases classified elsewhere without behavioral disturbance: Secondary | ICD-10-CM | POA: Diagnosis not present

## 2016-07-13 DIAGNOSIS — I6789 Other cerebrovascular disease: Secondary | ICD-10-CM | POA: Diagnosis not present

## 2016-07-13 DIAGNOSIS — F05 Delirium due to known physiological condition: Secondary | ICD-10-CM | POA: Diagnosis not present

## 2016-07-13 DIAGNOSIS — I10 Essential (primary) hypertension: Secondary | ICD-10-CM | POA: Diagnosis not present

## 2016-07-13 DIAGNOSIS — N390 Urinary tract infection, site not specified: Secondary | ICD-10-CM | POA: Diagnosis not present

## 2016-07-13 DIAGNOSIS — I6930 Unspecified sequelae of cerebral infarction: Secondary | ICD-10-CM | POA: Diagnosis not present

## 2016-07-13 DIAGNOSIS — B957 Other staphylococcus as the cause of diseases classified elsewhere: Secondary | ICD-10-CM | POA: Diagnosis not present

## 2016-07-13 DIAGNOSIS — I6340 Cerebral infarction due to embolism of unspecified cerebral artery: Secondary | ICD-10-CM | POA: Diagnosis not present

## 2016-07-13 DIAGNOSIS — R41841 Cognitive communication deficit: Secondary | ICD-10-CM | POA: Diagnosis not present

## 2016-07-13 DIAGNOSIS — E784 Other hyperlipidemia: Secondary | ICD-10-CM | POA: Diagnosis not present

## 2016-07-13 DIAGNOSIS — I69320 Aphasia following cerebral infarction: Secondary | ICD-10-CM | POA: Diagnosis not present

## 2016-07-16 ENCOUNTER — Telehealth: Payer: Self-pay | Admitting: Family Medicine

## 2016-07-17 ENCOUNTER — Telehealth: Payer: Self-pay | Admitting: Family Medicine

## 2016-07-17 NOTE — Telephone Encounter (Signed)
Called husband. [facility] is not taking any referrals for 7-10 days. Baron Hamper, he wants to wait.

## 2016-07-17 NOTE — Telephone Encounter (Signed)
No message needed °

## 2016-07-20 ENCOUNTER — Ambulatory Visit: Payer: PPO | Admitting: Family Medicine

## 2016-07-23 ENCOUNTER — Telehealth: Payer: Self-pay | Admitting: Family Medicine

## 2016-07-23 MED ORDER — FLUCONAZOLE 150 MG PO TABS: 150.0000 mg | ORAL_TABLET | Freq: Every day | ORAL | 0 refills | Status: DC

## 2016-07-23 NOTE — Telephone Encounter (Signed)
Diflucan 150 one daily for 3 days

## 2016-07-23 NOTE — Telephone Encounter (Signed)
Pt has severe vaginal redness with itching Would like RX for yeast infection Please advise

## 2016-07-23 NOTE — Telephone Encounter (Signed)
Pt notified of recommendation Verbalizes understanding 

## 2016-07-24 DIAGNOSIS — F0280 Dementia in other diseases classified elsewhere without behavioral disturbance: Secondary | ICD-10-CM | POA: Diagnosis not present

## 2016-07-24 DIAGNOSIS — F0390 Unspecified dementia without behavioral disturbance: Secondary | ICD-10-CM | POA: Diagnosis not present

## 2016-07-24 DIAGNOSIS — Z7952 Long term (current) use of systemic steroids: Secondary | ICD-10-CM | POA: Diagnosis not present

## 2016-07-24 DIAGNOSIS — I69320 Aphasia following cerebral infarction: Secondary | ICD-10-CM | POA: Diagnosis not present

## 2016-07-24 DIAGNOSIS — G894 Chronic pain syndrome: Secondary | ICD-10-CM | POA: Diagnosis not present

## 2016-07-24 DIAGNOSIS — I69398 Other sequelae of cerebral infarction: Secondary | ICD-10-CM | POA: Diagnosis not present

## 2016-07-24 DIAGNOSIS — I10 Essential (primary) hypertension: Secondary | ICD-10-CM | POA: Diagnosis not present

## 2016-07-24 DIAGNOSIS — E785 Hyperlipidemia, unspecified: Secondary | ICD-10-CM | POA: Diagnosis not present

## 2016-07-24 DIAGNOSIS — R2689 Other abnormalities of gait and mobility: Secondary | ICD-10-CM | POA: Diagnosis not present

## 2016-07-24 DIAGNOSIS — M1991 Primary osteoarthritis, unspecified site: Secondary | ICD-10-CM | POA: Diagnosis not present

## 2016-07-25 ENCOUNTER — Ambulatory Visit (INDEPENDENT_AMBULATORY_CARE_PROVIDER_SITE_OTHER): Payer: PPO | Admitting: Family Medicine

## 2016-07-25 ENCOUNTER — Encounter: Payer: Self-pay | Admitting: Family Medicine

## 2016-07-25 VITALS — BP 127/75 | HR 88 | Temp 98.0°F | Ht 63.0 in

## 2016-07-25 DIAGNOSIS — G4701 Insomnia due to medical condition: Secondary | ICD-10-CM

## 2016-07-25 DIAGNOSIS — Z0289 Encounter for other administrative examinations: Secondary | ICD-10-CM

## 2016-07-25 DIAGNOSIS — R1011 Right upper quadrant pain: Secondary | ICD-10-CM

## 2016-07-25 MED ORDER — TEMAZEPAM 30 MG PO CAPS: 30.0000 mg | ORAL_CAPSULE | Freq: Every day | ORAL | 5 refills | Status: DC

## 2016-07-25 MED ORDER — LORAZEPAM 1 MG PO TABS: 1.0000 mg | ORAL_TABLET | Freq: Two times a day (BID) | ORAL | 4 refills | Status: DC

## 2016-07-25 MED ORDER — TEMAZEPAM 30 MG PO CAPS: 30.0000 mg | ORAL_CAPSULE | Freq: Every day | ORAL | 5 refills | Status: AC

## 2016-07-25 MED ORDER — FENTANYL 50 MCG/HR TD PT72: 50.0000 ug | MEDICATED_PATCH | TRANSDERMAL | 0 refills | Status: DC

## 2016-07-25 MED ORDER — LINACLOTIDE 145 MCG PO CAPS: 145.0000 ug | ORAL_CAPSULE | Freq: Every day | ORAL | 5 refills | Status: DC

## 2016-07-25 MED ORDER — FLUCONAZOLE 150 MG PO TABS: 150.0000 mg | ORAL_TABLET | Freq: Once | ORAL | 0 refills | Status: AC

## 2016-07-25 NOTE — Progress Notes (Signed)
1  Subjective:  Patient ID: Amanda Hubbard, female    DOB: 1927-11-22  Age: 82 y.o. MRN: [id number]  CC: Hospitalization Follow-up (pt here today for follow up from stroke and UTI, she had been in the [facility] for a week and is now going to be at home with a full time caregiver)   HPI Amanda Hubbard presents for Follow-up of stroke. She is weak and fatigued. However she does not have significant focal weakness. There is some strength diminished on the left however. She has had some urinary frequency and pressure.  Follow-up hypertension follow-up of hypertension. Patient has no history of headache chest pain or shortness of breath or recent cough. Patient also denies symptoms of TIA such as numbness weakness lateralizing. Patient checks  blood pressure at home and has not had any elevated readings recently. Patient denies side effects from his medication. States taking it regularly. Patient in for follow-up of elevated cholesterol. Doing well without complaints on current medication. Denies side effects of statin including myalgia and arthralgia and nausea. Also in today for liver function testing. Currently no chest pain, shortness of breath or other cardiovascular related symptoms noted.   History Amanda Hubbard has a past medical history of CAD (coronary artery disease); Dyslipidemia; Female bladder prolapse; Hypertension; Insomnia; Mitral valve prolapse; Osteopenia; and Osteoporosis.   She has a past surgical history that includes Coronary artery bypass graft (2002); Nephrectomy (1980's); Vesicovaginal fistula closure w/ TAH; Nasal fracture surgery; and Bladder surgery.   Her family history includes Cancer in her maternal grandmother.She reports that she has never smoked. She has never used smokeless tobacco. She reports that she does not drink alcohol or use drugs.    ROS Review of Systems  Constitutional: Positive for activity change (decreased) and appetite change (decreased). Negative for  fever.  HENT: Negative for congestion, rhinorrhea and sore throat.   Eyes: Negative for visual disturbance.  Respiratory: Negative for cough and shortness of breath.   Cardiovascular: Negative for chest pain and palpitations.  Gastrointestinal: Negative for abdominal pain, diarrhea and nausea.  Genitourinary: Positive for dysuria.  Musculoskeletal: Negative for arthralgias and myalgias.    Objective:  BP 127/75   Pulse 88   Temp 98 F (36.7 C) (Oral)   Ht 5\' 3"  (1.6 m)   BP Readings from Last 3 Encounters:  07/25/16 127/75  06/05/16 125/68  05/16/16 (!) 150/74    Wt Readings from Last 3 Encounters:  06/05/16 108 lb (49 kg)  08/29/15 101 lb (45.8 kg)  02/22/15 105 lb (47.6 kg)     Physical Exam  Constitutional: She is oriented to person, place, and time. She appears well-developed and well-nourished. No distress.  HENT:  Head: Normocephalic and atraumatic.  Right Ear: External ear normal.  Left Ear: External ear normal.  Nose: Nose normal.  Mouth/Throat: Oropharynx is clear and moist.  Eyes: Conjunctivae and EOM are normal. Pupils are equal, round, and reactive to light.  Neck: Normal range of motion. Neck supple. No thyromegaly present.  Cardiovascular: Normal rate, regular rhythm and normal heart sounds.   No murmur heard. Pulmonary/Chest: Effort normal and breath sounds normal. No respiratory distress. She has no wheezes. She has no rales.  Abdominal: Soft. Bowel sounds are normal. She exhibits no distension. There is no tenderness.  Lymphadenopathy:    She has no cervical adenopathy.  Neurological: She is alert and oriented to person, place, and time. She has normal reflexes.  Skin: Skin is warm and dry.  Psychiatric:  She has a normal mood and affect. Her behavior is normal. Judgment and thought content normal.     Lab Results  Component Value Date   WBC 15.3 (H) 06/05/2016   HGB 13.3 02/22/2015   HCT 32.3 (L) 06/05/2016   PLT 388 (H) 06/05/2016   GLUCOSE  165 (H) 06/13/2016   CHOL 303 (H) 06/05/2016   TRIG 514 (H) 06/05/2016   HDL 64 06/05/2016   LDLCALC Comment 06/05/2016   ALT 26 06/05/2016   AST 17 06/05/2016   NA 139 06/13/2016   K 4.4 06/13/2016   CL 100 06/13/2016   CREATININE 1.33 (H) 06/13/2016   BUN 29 (H) 06/13/2016   CO2 23 06/13/2016   INR 1.0 06/11/2008    Ct Abdomen Pelvis W Contrast  Addendum Date: 08/26/2012   **ADDENDUM** CREATED: 08/26/2012 13:18:45 The patient had a prior CT in 2007 at [facility] under a different patient ID.  These PACS files have now been merged together, and the previous noncontrast abdomen CT from 05/23/2006 is now available for comparison with the current exam. Comparison shows that the complex cystic mass in the pancreatic head has mildly increased in size since previous study, currently measuring 5.4 x 4.6 cm compared to 4.0 x 3.1 cm on previous exam. This lesion also shows more complex features, although this could be due to the fact that the previous study did not utilize IV contrast.  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  Endoscopic ultrasound and cyst aspiration should be considered based on current consensus guidelines, as cited in the original report below. These results will be called to the ordering clinician or representative by the Radiologist Assistant, and communication documented in the PACS Dashboard. **END ADDENDUM** SIGNED BY: John A. Kris Hartmann, M.D.  Result Date: 08/20/2012 *RADIOLOGY REPORT* Clinical Data: Abdominal pain. Constipation.  Personal history of renal cell carcinoma. CT ABDOMEN AND PELVIS WITH CONTRAST Technique:  Multidetector CT imaging of the abdomen and pelvis was performed following the standard protocol during bolus administration of intravenous contrast. Contrast: 1107mL OMNIPAQUE IOHEXOL 300 MG/ML  SOLN Comparison: None. Findings: Numerous tiny gallstones are  seen.  Gallbladder is distended, however there is no evidence of gallbladder wall thickening or pericholecystic inflammatory changes. Diffuse biliary ductal dilatation is seen.  A complex cystic mass is seen in the porta hepatis arising from the pancreatic head which measures 4.6 x 5.4 cm.  This mass both thin and thickened internal septations, with multiple individual cystic foci measuring up to 3 cm.  Differential diagnosis includes serous and mucinous cystic pancreatic neoplasms. Mild pancreatic ductal dilatation is seen in the head and neck, but not the body or tail. Prior hysterectomy noted.  No soft tissue masses are seen in the nephrectomy bed.  A small left renal cyst is seen however there is no evidence of left renal mass or hydronephrosis.  No evidence of retroperitoneal or other abdominal lymphadenopathy. No pelvic soft tissue masses or lymphadenopathy identified. Previous hysterectomy noted.  Adnexal regions are unremarkable.  No evidence of inflammatory process, abscess, or ascites. IMPRESSION: 1.  5.4 cm complex cystic mass arising from the pancreatic head. Differential diagnosis includes both serous and mucinous cystic pancreatic neoplasms. Given its size, endoscopic ultrasound and cyst aspiration should be considered.  This recommendation follows ACR consensus guidelines:  Managing Incidental Findings on Abdominal CT:  White Paper of the ACR Incidental Findings Committee.  J Am Coll Radiol 2010;7:754-773 2.  Cholelithiasis and diffuse biliary ductal dilatation.  No definite signs of acute cholecystitis. 3.  No evidence of metastatic disease. Original Report Authenticated By: Earle Gell, M.D.    Assessment & Plan:   Mylee was seen today for hospitalization follow-up.  Diagnoses and all orders for this visit:  Pain medication agreement completed  Abdominal pain, right upper quadrant -     Discontinue: fentaNYL (DURAGESIC - DOSED MCG/HR) 50 MCG/HR; Place 1 patch (50 mcg total) onto the skin  every 3 (three) days. Do not fill until Nov. 23, 2017 -     fentaNYL (DURAGESIC - DOSED MCG/HR) 50 MCG/HR; Place 1 patch (50 mcg total) onto the skin every 3 (three) days. Do not fill until Nov. 23, 2017  Insomnia due to medical condition  Other orders -     Discontinue: LORazepam (ATIVAN) 1 MG tablet; Take 1 tablet (1 mg total) by mouth 2 (two) times daily. -     Discontinue: temazepam (RESTORIL) 30 MG capsule; Take 1 capsule (30 mg total) by mouth at bedtime. -     fluconazole (DIFLUCAN) 150 MG tablet; Take 1 tablet (150 mg total) by mouth once. At onset of symptoms. Repeat at end of treatment -     linaclotide (LINZESS) 145 MCG CAPS capsule; Take 1 capsule (145 mcg total) by mouth daily. To regulate bowel movements -     temazepam (RESTORIL) 30 MG capsule; Take 1 capsule (30 mg total) by mouth at bedtime. -     LORazepam (ATIVAN) 1 MG tablet; Take 1 tablet (1 mg total) by mouth 2 (two) times daily.    I have discontinued Ms. Drahos's phenazopyridine, ciprofloxacin, sulfamethoxazole-trimethoprim, and fluconazole. I am also having her start on fluconazole and linaclotide. Additionally, I am having her maintain her SENEXON-S, acetaminophen, amLODipine, gabapentin, predniSONE, atorvastatin, hydrochlorothiazide, temazepam, LORazepam, and fentaNYL.  Meds ordered this encounter  Medications  . atorvastatin (LIPITOR) 20 MG tablet    Sig: Take 20 mg by mouth daily.  . hydrochlorothiazide (HYDRODIURIL) 12.5 MG tablet    Sig: Take 12.5 mg by mouth daily.  Marland Kitchen DISCONTD: fentaNYL (DURAGESIC - DOSED MCG/HR) 50 MCG/HR    Sig: Place 1 patch (50 mcg total) onto the skin every 3 (three) days. Do not fill until Nov. 23, 2017    Dispense:  10 patch    Refill:  0  . DISCONTD: LORazepam (ATIVAN) 1 MG tablet    Sig: Take 1 tablet (1 mg total) by mouth 2 (two) times daily.    Dispense:  60 tablet    Refill:  4    Do not fill first fill before 08/23/2016. Then do not fill sooner than 30 days between fills.   Marland Kitchen DISCONTD: temazepam (RESTORIL) 30 MG capsule    Sig: Take 1 capsule (30 mg total) by mouth at bedtime.    Dispense:  30 capsule    Refill:  5    Do not fill until 07/30/2016  . fluconazole (DIFLUCAN) 150 MG tablet    Sig: Take 1 tablet (150 mg total) by mouth once. At onset of symptoms. Repeat at end of treatment    Dispense:  2 tablet    Refill:  0  . linaclotide (LINZESS) 145 MCG CAPS capsule    Sig: Take 1 capsule (145 mcg total) by mouth daily. To regulate bowel movements    Dispense:  30 capsule    Refill:  5  . temazepam (RESTORIL) 30 MG capsule    Sig: Take 1 capsule (30 mg  total) by mouth at bedtime.    Dispense:  30 capsule    Refill:  5    Do not fill until 07/30/2016  . LORazepam (ATIVAN) 1 MG tablet    Sig: Take 1 tablet (1 mg total) by mouth 2 (two) times daily.    Dispense:  60 tablet    Refill:  4    Do not fill first fill before 08/23/2016. Then do not fill sooner than 30 days between fills.  . fentaNYL (DURAGESIC - DOSED MCG/HR) 50 MCG/HR    Sig: Place 1 patch (50 mcg total) onto the skin every 3 (three) days. Do not fill until Nov. 23, 2017    Dispense:  10 patch    Refill:  0     Follow-up: Return in about 1 month (around 08/25/2016).  Claretta Fraise, M.D.

## 2016-07-31 DIAGNOSIS — F0390 Unspecified dementia without behavioral disturbance: Secondary | ICD-10-CM | POA: Diagnosis not present

## 2016-07-31 DIAGNOSIS — M1991 Primary osteoarthritis, unspecified site: Secondary | ICD-10-CM | POA: Diagnosis not present

## 2016-07-31 DIAGNOSIS — G894 Chronic pain syndrome: Secondary | ICD-10-CM | POA: Diagnosis not present

## 2016-07-31 DIAGNOSIS — R2689 Other abnormalities of gait and mobility: Secondary | ICD-10-CM | POA: Diagnosis not present

## 2016-07-31 DIAGNOSIS — I69398 Other sequelae of cerebral infarction: Secondary | ICD-10-CM | POA: Diagnosis not present

## 2016-07-31 DIAGNOSIS — Z7952 Long term (current) use of systemic steroids: Secondary | ICD-10-CM | POA: Diagnosis not present

## 2016-07-31 DIAGNOSIS — I69320 Aphasia following cerebral infarction: Secondary | ICD-10-CM | POA: Diagnosis not present

## 2016-07-31 DIAGNOSIS — I10 Essential (primary) hypertension: Secondary | ICD-10-CM | POA: Diagnosis not present

## 2016-07-31 DIAGNOSIS — E785 Hyperlipidemia, unspecified: Secondary | ICD-10-CM | POA: Diagnosis not present

## 2016-08-01 ENCOUNTER — Telehealth: Payer: Self-pay | Admitting: Family Medicine

## 2016-08-01 ENCOUNTER — Other Ambulatory Visit: Payer: Self-pay

## 2016-08-01 DIAGNOSIS — R300 Dysuria: Secondary | ICD-10-CM

## 2016-08-01 NOTE — Telephone Encounter (Signed)
Caregiver states that she is not able to get a specimen from patient.

## 2016-08-01 NOTE — Telephone Encounter (Signed)
Per caregiver pt is c/o dysuria Please advise

## 2016-08-01 NOTE — Telephone Encounter (Signed)
Please contact the patient . See if they can bring in a sterile specimen for urinalysis and culture.

## 2016-08-01 NOTE — Telephone Encounter (Signed)
Caregiver aware and states she will try to bring in a specimen and will call back if she is not able to bring one in today.

## 2016-08-02 DIAGNOSIS — I69320 Aphasia following cerebral infarction: Secondary | ICD-10-CM | POA: Diagnosis not present

## 2016-08-02 DIAGNOSIS — F0390 Unspecified dementia without behavioral disturbance: Secondary | ICD-10-CM | POA: Diagnosis not present

## 2016-08-02 DIAGNOSIS — I69398 Other sequelae of cerebral infarction: Secondary | ICD-10-CM | POA: Diagnosis not present

## 2016-08-02 DIAGNOSIS — E785 Hyperlipidemia, unspecified: Secondary | ICD-10-CM | POA: Diagnosis not present

## 2016-08-02 DIAGNOSIS — M1991 Primary osteoarthritis, unspecified site: Secondary | ICD-10-CM | POA: Diagnosis not present

## 2016-08-02 DIAGNOSIS — Z7952 Long term (current) use of systemic steroids: Secondary | ICD-10-CM | POA: Diagnosis not present

## 2016-08-02 DIAGNOSIS — I10 Essential (primary) hypertension: Secondary | ICD-10-CM | POA: Diagnosis not present

## 2016-08-02 DIAGNOSIS — R2689 Other abnormalities of gait and mobility: Secondary | ICD-10-CM | POA: Diagnosis not present

## 2016-08-02 DIAGNOSIS — G894 Chronic pain syndrome: Secondary | ICD-10-CM | POA: Diagnosis not present

## 2016-08-02 NOTE — Telephone Encounter (Signed)
Spoke with patient's caregiver and she is aware

## 2016-08-02 NOTE — Telephone Encounter (Signed)
Need to keep trying.

## 2016-08-06 DIAGNOSIS — E785 Hyperlipidemia, unspecified: Secondary | ICD-10-CM | POA: Diagnosis not present

## 2016-08-06 DIAGNOSIS — G894 Chronic pain syndrome: Secondary | ICD-10-CM | POA: Diagnosis not present

## 2016-08-06 DIAGNOSIS — R2689 Other abnormalities of gait and mobility: Secondary | ICD-10-CM | POA: Diagnosis not present

## 2016-08-06 DIAGNOSIS — Z7952 Long term (current) use of systemic steroids: Secondary | ICD-10-CM | POA: Diagnosis not present

## 2016-08-06 DIAGNOSIS — F0390 Unspecified dementia without behavioral disturbance: Secondary | ICD-10-CM | POA: Diagnosis not present

## 2016-08-06 DIAGNOSIS — I10 Essential (primary) hypertension: Secondary | ICD-10-CM | POA: Diagnosis not present

## 2016-08-06 DIAGNOSIS — I69320 Aphasia following cerebral infarction: Secondary | ICD-10-CM | POA: Diagnosis not present

## 2016-08-06 DIAGNOSIS — I69398 Other sequelae of cerebral infarction: Secondary | ICD-10-CM | POA: Diagnosis not present

## 2016-08-06 DIAGNOSIS — M1991 Primary osteoarthritis, unspecified site: Secondary | ICD-10-CM | POA: Diagnosis not present

## 2016-08-07 ENCOUNTER — Other Ambulatory Visit: Payer: PPO

## 2016-08-07 DIAGNOSIS — I10 Essential (primary) hypertension: Secondary | ICD-10-CM | POA: Diagnosis not present

## 2016-08-07 DIAGNOSIS — R2689 Other abnormalities of gait and mobility: Secondary | ICD-10-CM | POA: Diagnosis not present

## 2016-08-07 DIAGNOSIS — Z7952 Long term (current) use of systemic steroids: Secondary | ICD-10-CM | POA: Diagnosis not present

## 2016-08-07 DIAGNOSIS — F0390 Unspecified dementia without behavioral disturbance: Secondary | ICD-10-CM | POA: Diagnosis not present

## 2016-08-07 DIAGNOSIS — R300 Dysuria: Secondary | ICD-10-CM | POA: Diagnosis not present

## 2016-08-07 DIAGNOSIS — M1991 Primary osteoarthritis, unspecified site: Secondary | ICD-10-CM | POA: Diagnosis not present

## 2016-08-07 DIAGNOSIS — E785 Hyperlipidemia, unspecified: Secondary | ICD-10-CM | POA: Diagnosis not present

## 2016-08-07 DIAGNOSIS — I69398 Other sequelae of cerebral infarction: Secondary | ICD-10-CM | POA: Diagnosis not present

## 2016-08-07 DIAGNOSIS — G894 Chronic pain syndrome: Secondary | ICD-10-CM | POA: Diagnosis not present

## 2016-08-07 DIAGNOSIS — I69320 Aphasia following cerebral infarction: Secondary | ICD-10-CM | POA: Diagnosis not present

## 2016-08-07 LAB — URINALYSIS, COMPLETE
Bilirubin, UA: NEGATIVE
Glucose, UA: NEGATIVE
LEUKOCYTES UA: NEGATIVE
Nitrite, UA: NEGATIVE
PH UA: 6 (ref 5.0–7.5)
Protein, UA: NEGATIVE
RBC, UA: NEGATIVE
SPEC GRAV UA: 1.025 (ref 1.005–1.030)
Urobilinogen, Ur: 0.2 mg/dL (ref 0.2–1.0)

## 2016-08-07 LAB — MICROSCOPIC EXAMINATION: BACTERIA UA: NONE SEEN

## 2016-08-08 DIAGNOSIS — I69398 Other sequelae of cerebral infarction: Secondary | ICD-10-CM | POA: Diagnosis not present

## 2016-08-08 DIAGNOSIS — E785 Hyperlipidemia, unspecified: Secondary | ICD-10-CM | POA: Diagnosis not present

## 2016-08-08 DIAGNOSIS — G894 Chronic pain syndrome: Secondary | ICD-10-CM | POA: Diagnosis not present

## 2016-08-08 DIAGNOSIS — R627 Adult failure to thrive: Secondary | ICD-10-CM | POA: Diagnosis not present

## 2016-08-08 DIAGNOSIS — I69320 Aphasia following cerebral infarction: Secondary | ICD-10-CM | POA: Diagnosis not present

## 2016-08-08 DIAGNOSIS — I10 Essential (primary) hypertension: Secondary | ICD-10-CM | POA: Diagnosis not present

## 2016-08-08 DIAGNOSIS — R1011 Right upper quadrant pain: Secondary | ICD-10-CM | POA: Diagnosis not present

## 2016-08-08 DIAGNOSIS — Z7952 Long term (current) use of systemic steroids: Secondary | ICD-10-CM | POA: Diagnosis not present

## 2016-08-08 DIAGNOSIS — M1991 Primary osteoarthritis, unspecified site: Secondary | ICD-10-CM | POA: Diagnosis not present

## 2016-08-08 DIAGNOSIS — R2689 Other abnormalities of gait and mobility: Secondary | ICD-10-CM | POA: Diagnosis not present

## 2016-08-08 DIAGNOSIS — F0390 Unspecified dementia without behavioral disturbance: Secondary | ICD-10-CM | POA: Diagnosis not present

## 2016-08-08 LAB — URINE CULTURE

## 2016-08-09 ENCOUNTER — Other Ambulatory Visit: Payer: Self-pay | Admitting: Family Medicine

## 2016-08-09 MED ORDER — HYDROCHLOROTHIAZIDE 12.5 MG PO TABS: 12.5000 mg | ORAL_TABLET | Freq: Every day | ORAL | 5 refills | Status: DC

## 2016-08-09 NOTE — Telephone Encounter (Signed)
done

## 2016-08-10 DIAGNOSIS — R1011 Right upper quadrant pain: Secondary | ICD-10-CM | POA: Diagnosis not present

## 2016-08-10 DIAGNOSIS — R627 Adult failure to thrive: Secondary | ICD-10-CM | POA: Diagnosis not present

## 2016-08-13 DIAGNOSIS — G894 Chronic pain syndrome: Secondary | ICD-10-CM | POA: Diagnosis not present

## 2016-08-13 DIAGNOSIS — I69398 Other sequelae of cerebral infarction: Secondary | ICD-10-CM | POA: Diagnosis not present

## 2016-08-13 DIAGNOSIS — M1991 Primary osteoarthritis, unspecified site: Secondary | ICD-10-CM | POA: Diagnosis not present

## 2016-08-13 DIAGNOSIS — R2689 Other abnormalities of gait and mobility: Secondary | ICD-10-CM | POA: Diagnosis not present

## 2016-08-13 DIAGNOSIS — I69320 Aphasia following cerebral infarction: Secondary | ICD-10-CM | POA: Diagnosis not present

## 2016-08-13 DIAGNOSIS — F0390 Unspecified dementia without behavioral disturbance: Secondary | ICD-10-CM | POA: Diagnosis not present

## 2016-08-13 DIAGNOSIS — Z7952 Long term (current) use of systemic steroids: Secondary | ICD-10-CM | POA: Diagnosis not present

## 2016-08-13 DIAGNOSIS — I10 Essential (primary) hypertension: Secondary | ICD-10-CM | POA: Diagnosis not present

## 2016-08-13 DIAGNOSIS — E785 Hyperlipidemia, unspecified: Secondary | ICD-10-CM | POA: Diagnosis not present

## 2016-08-14 ENCOUNTER — Telehealth: Payer: Self-pay | Admitting: *Deleted

## 2016-08-14 DIAGNOSIS — F0390 Unspecified dementia without behavioral disturbance: Secondary | ICD-10-CM | POA: Diagnosis not present

## 2016-08-14 DIAGNOSIS — I69398 Other sequelae of cerebral infarction: Secondary | ICD-10-CM | POA: Diagnosis not present

## 2016-08-14 DIAGNOSIS — E785 Hyperlipidemia, unspecified: Secondary | ICD-10-CM | POA: Diagnosis not present

## 2016-08-14 DIAGNOSIS — I10 Essential (primary) hypertension: Secondary | ICD-10-CM | POA: Diagnosis not present

## 2016-08-14 DIAGNOSIS — Z7952 Long term (current) use of systemic steroids: Secondary | ICD-10-CM | POA: Diagnosis not present

## 2016-08-14 DIAGNOSIS — I69320 Aphasia following cerebral infarction: Secondary | ICD-10-CM | POA: Diagnosis not present

## 2016-08-14 DIAGNOSIS — M1991 Primary osteoarthritis, unspecified site: Secondary | ICD-10-CM | POA: Diagnosis not present

## 2016-08-14 DIAGNOSIS — G894 Chronic pain syndrome: Secondary | ICD-10-CM | POA: Diagnosis not present

## 2016-08-14 DIAGNOSIS — R2689 Other abnormalities of gait and mobility: Secondary | ICD-10-CM | POA: Diagnosis not present

## 2016-08-14 MED ORDER — LINACLOTIDE 72 MCG PO CAPS: 72.0000 ug | ORAL_CAPSULE | Freq: Every day | ORAL | 1 refills | Status: DC

## 2016-08-14 NOTE — Telephone Encounter (Signed)
Aware that [place] can be decreased to 72 mcg

## 2016-08-14 NOTE — Telephone Encounter (Signed)
Pt is taking Linzess 145 mcg, altittle to strong, can we change to 72 mcgs? If so send to [facility] and notifiy

## 2016-08-14 NOTE — Telephone Encounter (Signed)
Okay to decrease dose to 72 g

## 2016-08-15 ENCOUNTER — Other Ambulatory Visit: Payer: Self-pay | Admitting: *Deleted

## 2016-08-15 DIAGNOSIS — F0390 Unspecified dementia without behavioral disturbance: Secondary | ICD-10-CM | POA: Diagnosis not present

## 2016-08-15 DIAGNOSIS — I69398 Other sequelae of cerebral infarction: Secondary | ICD-10-CM | POA: Diagnosis not present

## 2016-08-15 DIAGNOSIS — E785 Hyperlipidemia, unspecified: Secondary | ICD-10-CM | POA: Diagnosis not present

## 2016-08-15 DIAGNOSIS — M1991 Primary osteoarthritis, unspecified site: Secondary | ICD-10-CM | POA: Diagnosis not present

## 2016-08-15 DIAGNOSIS — R2689 Other abnormalities of gait and mobility: Secondary | ICD-10-CM | POA: Diagnosis not present

## 2016-08-15 DIAGNOSIS — I69320 Aphasia following cerebral infarction: Secondary | ICD-10-CM | POA: Diagnosis not present

## 2016-08-15 DIAGNOSIS — I10 Essential (primary) hypertension: Secondary | ICD-10-CM | POA: Diagnosis not present

## 2016-08-15 DIAGNOSIS — Z7952 Long term (current) use of systemic steroids: Secondary | ICD-10-CM | POA: Diagnosis not present

## 2016-08-15 DIAGNOSIS — G894 Chronic pain syndrome: Secondary | ICD-10-CM | POA: Diagnosis not present

## 2016-08-15 MED ORDER — LORAZEPAM 1 MG PO TABS: 1.0000 mg | ORAL_TABLET | Freq: Two times a day (BID) | ORAL | 4 refills | Status: AC

## 2016-08-15 NOTE — Telephone Encounter (Signed)
[place]  pt Press photographer coverage

## 2016-08-20 DIAGNOSIS — E785 Hyperlipidemia, unspecified: Secondary | ICD-10-CM | POA: Diagnosis not present

## 2016-08-20 DIAGNOSIS — G894 Chronic pain syndrome: Secondary | ICD-10-CM | POA: Diagnosis not present

## 2016-08-20 DIAGNOSIS — M1991 Primary osteoarthritis, unspecified site: Secondary | ICD-10-CM | POA: Diagnosis not present

## 2016-08-20 DIAGNOSIS — I69320 Aphasia following cerebral infarction: Secondary | ICD-10-CM | POA: Diagnosis not present

## 2016-08-20 DIAGNOSIS — I69398 Other sequelae of cerebral infarction: Secondary | ICD-10-CM | POA: Diagnosis not present

## 2016-08-20 DIAGNOSIS — R2689 Other abnormalities of gait and mobility: Secondary | ICD-10-CM | POA: Diagnosis not present

## 2016-08-20 DIAGNOSIS — Z7952 Long term (current) use of systemic steroids: Secondary | ICD-10-CM | POA: Diagnosis not present

## 2016-08-20 DIAGNOSIS — F0390 Unspecified dementia without behavioral disturbance: Secondary | ICD-10-CM | POA: Diagnosis not present

## 2016-08-20 DIAGNOSIS — I10 Essential (primary) hypertension: Secondary | ICD-10-CM | POA: Diagnosis not present

## 2016-08-21 DIAGNOSIS — I69398 Other sequelae of cerebral infarction: Secondary | ICD-10-CM | POA: Diagnosis not present

## 2016-08-21 DIAGNOSIS — E785 Hyperlipidemia, unspecified: Secondary | ICD-10-CM | POA: Diagnosis not present

## 2016-08-21 DIAGNOSIS — F0390 Unspecified dementia without behavioral disturbance: Secondary | ICD-10-CM | POA: Diagnosis not present

## 2016-08-21 DIAGNOSIS — Z7952 Long term (current) use of systemic steroids: Secondary | ICD-10-CM | POA: Diagnosis not present

## 2016-08-21 DIAGNOSIS — I10 Essential (primary) hypertension: Secondary | ICD-10-CM | POA: Diagnosis not present

## 2016-08-21 DIAGNOSIS — M1991 Primary osteoarthritis, unspecified site: Secondary | ICD-10-CM | POA: Diagnosis not present

## 2016-08-21 DIAGNOSIS — I69320 Aphasia following cerebral infarction: Secondary | ICD-10-CM | POA: Diagnosis not present

## 2016-08-21 DIAGNOSIS — R2689 Other abnormalities of gait and mobility: Secondary | ICD-10-CM | POA: Diagnosis not present

## 2016-08-21 DIAGNOSIS — G894 Chronic pain syndrome: Secondary | ICD-10-CM | POA: Diagnosis not present

## 2016-08-24 DIAGNOSIS — R109 Unspecified abdominal pain: Secondary | ICD-10-CM | POA: Diagnosis not present

## 2016-08-27 ENCOUNTER — Other Ambulatory Visit: Payer: Self-pay | Admitting: *Deleted

## 2016-08-27 ENCOUNTER — Other Ambulatory Visit: Payer: Self-pay | Admitting: Family Medicine

## 2016-08-27 DIAGNOSIS — I69320 Aphasia following cerebral infarction: Secondary | ICD-10-CM | POA: Diagnosis not present

## 2016-08-27 DIAGNOSIS — M1991 Primary osteoarthritis, unspecified site: Secondary | ICD-10-CM | POA: Diagnosis not present

## 2016-08-27 DIAGNOSIS — F0390 Unspecified dementia without behavioral disturbance: Secondary | ICD-10-CM | POA: Diagnosis not present

## 2016-08-27 DIAGNOSIS — I69398 Other sequelae of cerebral infarction: Secondary | ICD-10-CM | POA: Diagnosis not present

## 2016-08-27 DIAGNOSIS — R2689 Other abnormalities of gait and mobility: Secondary | ICD-10-CM | POA: Diagnosis not present

## 2016-08-27 DIAGNOSIS — I10 Essential (primary) hypertension: Secondary | ICD-10-CM | POA: Diagnosis not present

## 2016-08-27 DIAGNOSIS — E785 Hyperlipidemia, unspecified: Secondary | ICD-10-CM | POA: Diagnosis not present

## 2016-08-27 DIAGNOSIS — R1011 Right upper quadrant pain: Secondary | ICD-10-CM

## 2016-08-27 DIAGNOSIS — G894 Chronic pain syndrome: Secondary | ICD-10-CM | POA: Diagnosis not present

## 2016-08-27 DIAGNOSIS — Z7952 Long term (current) use of systemic steroids: Secondary | ICD-10-CM | POA: Diagnosis not present

## 2016-08-27 MED ORDER — ATORVASTATIN CALCIUM 20 MG PO TABS: 20.0000 mg | ORAL_TABLET | Freq: Every day | ORAL | 5 refills | Status: AC

## 2016-08-27 NOTE — Telephone Encounter (Signed)
Family had written rx at home.

## 2016-08-27 NOTE — Telephone Encounter (Signed)
What is going on with this she is out. Call Family asap. Route to pool

## 2016-08-27 NOTE — Telephone Encounter (Signed)
Please contact the patient. She will have to see Dr. More, her primary.

## 2016-08-27 NOTE — Telephone Encounter (Signed)
Refill sent to pharmacy per protocol

## 2016-08-29 DIAGNOSIS — F0390 Unspecified dementia without behavioral disturbance: Secondary | ICD-10-CM | POA: Diagnosis not present

## 2016-08-29 DIAGNOSIS — M1991 Primary osteoarthritis, unspecified site: Secondary | ICD-10-CM | POA: Diagnosis not present

## 2016-08-29 DIAGNOSIS — G894 Chronic pain syndrome: Secondary | ICD-10-CM | POA: Diagnosis not present

## 2016-08-29 DIAGNOSIS — Z7952 Long term (current) use of systemic steroids: Secondary | ICD-10-CM | POA: Diagnosis not present

## 2016-08-29 DIAGNOSIS — R2689 Other abnormalities of gait and mobility: Secondary | ICD-10-CM | POA: Diagnosis not present

## 2016-08-29 DIAGNOSIS — I10 Essential (primary) hypertension: Secondary | ICD-10-CM | POA: Diagnosis not present

## 2016-08-29 DIAGNOSIS — I69320 Aphasia following cerebral infarction: Secondary | ICD-10-CM | POA: Diagnosis not present

## 2016-08-29 DIAGNOSIS — I69398 Other sequelae of cerebral infarction: Secondary | ICD-10-CM | POA: Diagnosis not present

## 2016-08-29 DIAGNOSIS — E785 Hyperlipidemia, unspecified: Secondary | ICD-10-CM | POA: Diagnosis not present

## 2016-09-04 ENCOUNTER — Other Ambulatory Visit: Payer: PPO

## 2016-09-04 DIAGNOSIS — I10 Essential (primary) hypertension: Secondary | ICD-10-CM

## 2016-09-04 DIAGNOSIS — I2510 Atherosclerotic heart disease of native coronary artery without angina pectoris: Secondary | ICD-10-CM

## 2016-09-04 DIAGNOSIS — Z7952 Long term (current) use of systemic steroids: Secondary | ICD-10-CM | POA: Diagnosis not present

## 2016-09-04 DIAGNOSIS — F0390 Unspecified dementia without behavioral disturbance: Secondary | ICD-10-CM | POA: Diagnosis not present

## 2016-09-04 DIAGNOSIS — M1991 Primary osteoarthritis, unspecified site: Secondary | ICD-10-CM | POA: Diagnosis not present

## 2016-09-04 DIAGNOSIS — I69320 Aphasia following cerebral infarction: Secondary | ICD-10-CM | POA: Diagnosis not present

## 2016-09-04 DIAGNOSIS — G894 Chronic pain syndrome: Secondary | ICD-10-CM | POA: Diagnosis not present

## 2016-09-04 DIAGNOSIS — E559 Vitamin D deficiency, unspecified: Secondary | ICD-10-CM | POA: Diagnosis not present

## 2016-09-04 DIAGNOSIS — E785 Hyperlipidemia, unspecified: Secondary | ICD-10-CM | POA: Diagnosis not present

## 2016-09-04 DIAGNOSIS — R2689 Other abnormalities of gait and mobility: Secondary | ICD-10-CM | POA: Diagnosis not present

## 2016-09-04 DIAGNOSIS — I69398 Other sequelae of cerebral infarction: Secondary | ICD-10-CM | POA: Diagnosis not present

## 2016-09-05 ENCOUNTER — Other Ambulatory Visit: Payer: Self-pay | Admitting: Family Medicine

## 2016-09-05 DIAGNOSIS — R627 Adult failure to thrive: Secondary | ICD-10-CM

## 2016-09-05 LAB — BMP8+EGFR
BUN/Creatinine Ratio: 22 (ref 12–28)
BUN: 25 mg/dL (ref 8–27)
CALCIUM: 8.9 mg/dL (ref 8.7–10.3)
CHLORIDE: 102 mmol/L (ref 96–106)
CO2: 27 mmol/L (ref 18–29)
Creatinine, Ser: 1.16 mg/dL — ABNORMAL HIGH (ref 0.57–1.00)
GFR calc non Af Amer: 43 mL/min/{1.73_m2} — ABNORMAL LOW (ref >59)
GFR, EST AFRICAN AMERICAN: 49 mL/min/{1.73_m2} — AB (ref >59)
Glucose: 301 mg/dL — ABNORMAL HIGH (ref 65–99)
Potassium: 4.1 mmol/L (ref 3.5–5.2)
Sodium: 145 mmol/L — ABNORMAL HIGH (ref 134–144)

## 2016-09-05 LAB — HEPATIC FUNCTION PANEL
ALBUMIN: 3.4 g/dL — AB (ref 3.5–4.7)
ALT: 10 IU/L (ref 0–32)
AST: 12 IU/L (ref 0–40)
Alkaline Phosphatase: 54 IU/L (ref 39–117)
BILIRUBIN TOTAL: 0.8 mg/dL (ref 0.0–1.2)
BILIRUBIN, DIRECT: 0.17 mg/dL (ref 0.00–0.40)
TOTAL PROTEIN: 5.7 g/dL — AB (ref 6.0–8.5)

## 2016-09-05 NOTE — Telephone Encounter (Signed)
Please review and advise.

## 2016-09-06 DIAGNOSIS — M1991 Primary osteoarthritis, unspecified site: Secondary | ICD-10-CM | POA: Diagnosis not present

## 2016-09-06 DIAGNOSIS — E785 Hyperlipidemia, unspecified: Secondary | ICD-10-CM | POA: Diagnosis not present

## 2016-09-06 DIAGNOSIS — F0390 Unspecified dementia without behavioral disturbance: Secondary | ICD-10-CM | POA: Diagnosis not present

## 2016-09-06 DIAGNOSIS — I10 Essential (primary) hypertension: Secondary | ICD-10-CM | POA: Diagnosis not present

## 2016-09-06 DIAGNOSIS — I69398 Other sequelae of cerebral infarction: Secondary | ICD-10-CM | POA: Diagnosis not present

## 2016-09-06 DIAGNOSIS — I69320 Aphasia following cerebral infarction: Secondary | ICD-10-CM | POA: Diagnosis not present

## 2016-09-06 DIAGNOSIS — G894 Chronic pain syndrome: Secondary | ICD-10-CM | POA: Diagnosis not present

## 2016-09-06 DIAGNOSIS — R2689 Other abnormalities of gait and mobility: Secondary | ICD-10-CM | POA: Diagnosis not present

## 2016-09-06 DIAGNOSIS — Z7952 Long term (current) use of systemic steroids: Secondary | ICD-10-CM | POA: Diagnosis not present

## 2016-09-06 NOTE — Telephone Encounter (Signed)
Please send request to his PCP 

## 2016-09-07 DIAGNOSIS — R1011 Right upper quadrant pain: Secondary | ICD-10-CM | POA: Diagnosis not present

## 2016-09-07 DIAGNOSIS — R627 Adult failure to thrive: Secondary | ICD-10-CM | POA: Diagnosis not present

## 2016-09-07 NOTE — Telephone Encounter (Signed)
this been outstanding for 4 day, have [place] address today, please. Not his fault, it was just overlooked

## 2016-09-09 DIAGNOSIS — R627 Adult failure to thrive: Secondary | ICD-10-CM | POA: Diagnosis not present

## 2016-09-09 DIAGNOSIS — R1011 Right upper quadrant pain: Secondary | ICD-10-CM | POA: Diagnosis not present

## 2016-09-10 NOTE — Telephone Encounter (Signed)
[facility] pt Please review and advise

## 2016-09-11 DIAGNOSIS — I69320 Aphasia following cerebral infarction: Secondary | ICD-10-CM | POA: Diagnosis not present

## 2016-09-11 DIAGNOSIS — G894 Chronic pain syndrome: Secondary | ICD-10-CM | POA: Diagnosis not present

## 2016-09-11 DIAGNOSIS — R2689 Other abnormalities of gait and mobility: Secondary | ICD-10-CM | POA: Diagnosis not present

## 2016-09-11 DIAGNOSIS — I10 Essential (primary) hypertension: Secondary | ICD-10-CM | POA: Diagnosis not present

## 2016-09-11 DIAGNOSIS — I69398 Other sequelae of cerebral infarction: Secondary | ICD-10-CM | POA: Diagnosis not present

## 2016-09-11 DIAGNOSIS — F0390 Unspecified dementia without behavioral disturbance: Secondary | ICD-10-CM | POA: Diagnosis not present

## 2016-09-11 DIAGNOSIS — Z7952 Long term (current) use of systemic steroids: Secondary | ICD-10-CM | POA: Diagnosis not present

## 2016-09-11 DIAGNOSIS — M1991 Primary osteoarthritis, unspecified site: Secondary | ICD-10-CM | POA: Diagnosis not present

## 2016-09-11 DIAGNOSIS — E785 Hyperlipidemia, unspecified: Secondary | ICD-10-CM | POA: Diagnosis not present

## 2016-09-11 NOTE — Telephone Encounter (Signed)
Does this patient take this everyday?

## 2016-09-13 ENCOUNTER — Other Ambulatory Visit: Payer: PPO

## 2016-09-13 DIAGNOSIS — R2689 Other abnormalities of gait and mobility: Secondary | ICD-10-CM | POA: Diagnosis not present

## 2016-09-13 DIAGNOSIS — I69320 Aphasia following cerebral infarction: Secondary | ICD-10-CM | POA: Diagnosis not present

## 2016-09-13 DIAGNOSIS — I69398 Other sequelae of cerebral infarction: Secondary | ICD-10-CM | POA: Diagnosis not present

## 2016-09-13 DIAGNOSIS — R739 Hyperglycemia, unspecified: Secondary | ICD-10-CM

## 2016-09-13 DIAGNOSIS — Z7952 Long term (current) use of systemic steroids: Secondary | ICD-10-CM | POA: Diagnosis not present

## 2016-09-13 DIAGNOSIS — E785 Hyperlipidemia, unspecified: Secondary | ICD-10-CM | POA: Diagnosis not present

## 2016-09-13 DIAGNOSIS — F0390 Unspecified dementia without behavioral disturbance: Secondary | ICD-10-CM | POA: Diagnosis not present

## 2016-09-13 DIAGNOSIS — I10 Essential (primary) hypertension: Secondary | ICD-10-CM | POA: Diagnosis not present

## 2016-09-13 DIAGNOSIS — G894 Chronic pain syndrome: Secondary | ICD-10-CM | POA: Diagnosis not present

## 2016-09-13 DIAGNOSIS — M1991 Primary osteoarthritis, unspecified site: Secondary | ICD-10-CM | POA: Diagnosis not present

## 2016-09-13 LAB — BAYER DCA HB A1C WAIVED: HB A1C: 6.5 % (ref <7.0)

## 2016-09-14 ENCOUNTER — Telehealth: Payer: Self-pay | Admitting: *Deleted

## 2016-09-14 DIAGNOSIS — R932 Abnormal findings on diagnostic imaging of liver and biliary tract: Secondary | ICD-10-CM

## 2016-09-14 DIAGNOSIS — R935 Abnormal findings on diagnostic imaging of other abdominal regions, including retroperitoneum: Secondary | ICD-10-CM

## 2016-09-14 NOTE — Telephone Encounter (Signed)
[facility] done a [place] (under images) and shows abnormal gallbladder - husband want to know what we can do about this since she has had the pain for so long - ? General surg??

## 2016-09-15 NOTE — Telephone Encounter (Signed)
Asian has had pain for a long time and in the past has refused to do anything about it. Please do a referral to Gen. surgery for further evaluation and consideration if the patient will allow this. Do this to a Psychologist, sport and exercise in [place].

## 2016-09-17 DIAGNOSIS — Z7952 Long term (current) use of systemic steroids: Secondary | ICD-10-CM | POA: Diagnosis not present

## 2016-09-17 DIAGNOSIS — R2689 Other abnormalities of gait and mobility: Secondary | ICD-10-CM | POA: Diagnosis not present

## 2016-09-17 DIAGNOSIS — I10 Essential (primary) hypertension: Secondary | ICD-10-CM | POA: Diagnosis not present

## 2016-09-17 DIAGNOSIS — I69398 Other sequelae of cerebral infarction: Secondary | ICD-10-CM | POA: Diagnosis not present

## 2016-09-17 DIAGNOSIS — F0390 Unspecified dementia without behavioral disturbance: Secondary | ICD-10-CM | POA: Diagnosis not present

## 2016-09-17 DIAGNOSIS — M1991 Primary osteoarthritis, unspecified site: Secondary | ICD-10-CM | POA: Diagnosis not present

## 2016-09-17 DIAGNOSIS — G894 Chronic pain syndrome: Secondary | ICD-10-CM | POA: Diagnosis not present

## 2016-09-17 DIAGNOSIS — I69320 Aphasia following cerebral infarction: Secondary | ICD-10-CM | POA: Diagnosis not present

## 2016-09-17 DIAGNOSIS — E785 Hyperlipidemia, unspecified: Secondary | ICD-10-CM | POA: Diagnosis not present

## 2016-09-17 NOTE — Telephone Encounter (Signed)
Husband aware that we will refer to general surg. For follow up

## 2016-09-19 DIAGNOSIS — E785 Hyperlipidemia, unspecified: Secondary | ICD-10-CM | POA: Diagnosis not present

## 2016-09-19 DIAGNOSIS — G894 Chronic pain syndrome: Secondary | ICD-10-CM | POA: Diagnosis not present

## 2016-09-19 DIAGNOSIS — F0390 Unspecified dementia without behavioral disturbance: Secondary | ICD-10-CM | POA: Diagnosis not present

## 2016-09-19 DIAGNOSIS — M1991 Primary osteoarthritis, unspecified site: Secondary | ICD-10-CM | POA: Diagnosis not present

## 2016-09-19 DIAGNOSIS — I69320 Aphasia following cerebral infarction: Secondary | ICD-10-CM | POA: Diagnosis not present

## 2016-09-19 DIAGNOSIS — R2689 Other abnormalities of gait and mobility: Secondary | ICD-10-CM | POA: Diagnosis not present

## 2016-09-19 DIAGNOSIS — I10 Essential (primary) hypertension: Secondary | ICD-10-CM | POA: Diagnosis not present

## 2016-09-19 DIAGNOSIS — I69398 Other sequelae of cerebral infarction: Secondary | ICD-10-CM | POA: Diagnosis not present

## 2016-09-19 DIAGNOSIS — Z7952 Long term (current) use of systemic steroids: Secondary | ICD-10-CM | POA: Diagnosis not present

## 2016-09-25 ENCOUNTER — Telehealth: Payer: Self-pay | Admitting: *Deleted

## 2016-09-25 DIAGNOSIS — R1011 Right upper quadrant pain: Secondary | ICD-10-CM

## 2016-09-25 MED ORDER — BLOOD GLUCOSE METER KIT: PACK | 1 refills | Status: DC

## 2016-09-25 MED ORDER — FENTANYL 50 MCG/HR TD PT72: 50.0000 ug | MEDICATED_PATCH | TRANSDERMAL | 0 refills | Status: AC

## 2016-09-25 NOTE — Telephone Encounter (Signed)
[place] nurse called - Kayte will need Fentnyl patch by Friday - she has one left - stacks is off all week - printed for [facility]    She also needed a BS machine - printed and will fax to the drug store today

## 2016-10-03 ENCOUNTER — Ambulatory Visit (INDEPENDENT_AMBULATORY_CARE_PROVIDER_SITE_OTHER): Payer: PPO | Admitting: Pharmacist

## 2016-10-03 ENCOUNTER — Encounter: Payer: Self-pay | Admitting: Pharmacist

## 2016-10-03 ENCOUNTER — Other Ambulatory Visit: Payer: Self-pay | Admitting: Family Medicine

## 2016-10-03 VITALS — BP 128/72 | HR 68

## 2016-10-03 DIAGNOSIS — H1031 Unspecified acute conjunctivitis, right eye: Secondary | ICD-10-CM | POA: Diagnosis not present

## 2016-10-03 DIAGNOSIS — I69398 Other sequelae of cerebral infarction: Secondary | ICD-10-CM | POA: Diagnosis not present

## 2016-10-03 DIAGNOSIS — I10 Essential (primary) hypertension: Secondary | ICD-10-CM | POA: Diagnosis not present

## 2016-10-03 DIAGNOSIS — E119 Type 2 diabetes mellitus without complications: Secondary | ICD-10-CM | POA: Diagnosis not present

## 2016-10-03 DIAGNOSIS — M1991 Primary osteoarthritis, unspecified site: Secondary | ICD-10-CM | POA: Diagnosis not present

## 2016-10-03 DIAGNOSIS — R2689 Other abnormalities of gait and mobility: Secondary | ICD-10-CM | POA: Diagnosis not present

## 2016-10-03 DIAGNOSIS — F0390 Unspecified dementia without behavioral disturbance: Secondary | ICD-10-CM | POA: Diagnosis not present

## 2016-10-03 DIAGNOSIS — G894 Chronic pain syndrome: Secondary | ICD-10-CM | POA: Diagnosis not present

## 2016-10-03 DIAGNOSIS — E785 Hyperlipidemia, unspecified: Secondary | ICD-10-CM | POA: Diagnosis not present

## 2016-10-03 DIAGNOSIS — I69320 Aphasia following cerebral infarction: Secondary | ICD-10-CM | POA: Diagnosis not present

## 2016-10-03 DIAGNOSIS — Z7952 Long term (current) use of systemic steroids: Secondary | ICD-10-CM | POA: Diagnosis not present

## 2016-10-03 MED ORDER — PREDNISONE 5 MG PO TABS: ORAL_TABLET | ORAL | 1 refills | Status: DC

## 2016-10-03 MED ORDER — SULFACETAMIDE SODIUM 10 % OP SOLN: 1.0000 [drp] | OPHTHALMIC | 0 refills | Status: AC

## 2016-10-03 MED ORDER — DOXYCYCLINE HYCLATE 100 MG PO TABS: 100.0000 mg | ORAL_TABLET | Freq: Two times a day (BID) | ORAL | 0 refills | Status: DC

## 2016-10-03 NOTE — Progress Notes (Signed)
Patient ID: Amanda Hubbard, female   DOB: 06-Apr-1928, 83 y.o.   MRN: [id number]   Subjective:    Amanda Hubbard is a 83 y.o. female who presents for an initial evaluation of Type 2 diabetes mellitus.  Amanda Hubbard has just been diagnosed with type 2 DM based on RBG of 301 (09/04/2016) and then A1c of 6.5% (09/13/2016).   Amanda Hubbard had previously been on Hospice due to failure to thrive and weight loss.  She was placed on prednisone 20mg  daily to increase appetite and weight.  Per care givers present with her she has gained weight back and is eating well.    Known diabetic complications: none Cardiovascular risk factors: advanced age (older than 74 for men, 60 for women), diabetes mellitus, dyslipidemia, hypertension and sedentary lifestyle Current diabetic medications include none.   Eye exam current (within one year): no  Care giver mentions that they are concerned with patient's right eye.  It has been red and swollen for the lat 1-2 days.  She also notes that the right eye has been matted shut in the am several mornings this last week. Weight trend: stable Prior visit with CDE: no Current diet: in general, an "unhealthy" diet Current exercise: none Medication Compliance?  Yes  Current monitoring regimen: patient received glucometer but care giver reports that they have had difficulty getting it to work.  She has glucometer with her today. Home blood sugar records: none Any episodes of hypoglycemia? no  Is She on ACE inhibitor or angiotensin II receptor blocker?  No       The following portions of the patient's history were reviewed and updated as appropriate: allergies, current medications, past family history, past medical history, past social history, past surgical history and problem list.    Objective:    BP 128/72   Pulse 68    Review of Systems  Eyes: Positive for discharge (per observation of Dr Redge Gainer) and redness.     Lab Review Glucose (mg/dL)   Date Value  09/04/2016 301 (H)  06/13/2016 165 (H)  06/05/2016 142 (H)   Glucose, Bld  Date Value  04/01/2013 96 mg/dL  10/03/2012 92 mg/dL  06/10/2008 104 (H)   CO2 (mmol/L)  Date Value  09/04/2016 27  06/13/2016 23  06/05/2016 23   BUN (mg/dL)  Date Value  09/04/2016 25  06/13/2016 29 (H)  06/05/2016 26   Creat (mg/dL)  Date Value  04/01/2013 1.26 (H)   Creatinine, Ser (mg/dL)  Date Value  09/04/2016 1.16 (H)  06/13/2016 1.33 (H)  06/05/2016 1.09 (H)   A1c = 6.5% (09/13/2016)    Assessment:    Diabetes Mellitus type II, under inadequate control.  Elevation in BG might be related to prednisone use. Conjunctivitis.    Plan:    1.  Dr Laurance Flatten came in during about 5 minutes of visit to physically exam patient's eye.  He recommended doxycycline 100mg  bid for 7 days and sulfacetamide eye drops - 1 drop in right every every 4 hours.  2.  Education: Reviewed 'ABCs' of diabetes management (respective goals in parentheses):  A1C (<7), blood pressure (<130/80), and cholesterol (LDL <100). 3. Discussed pathophysiology of DM; difference between type 1 and type 2 DM. 4. CHO counting diet discussed.  Reviewed CHO amount in various foods and how to read nutrition labels.  Discussed recommended serving sizes.  5.  Recommend check BG 1  times a day.  Reviewed with caregivers how to check BG with patient's  glucometer - reason she was having difficulty was because she was not inserting test strip correctly. Goals for BG discussed. 6.   Will also have patient taper off prednisone.  Decrease to 5mg  daily for next month and then 2.5mg  daily for 1 month and then discontinue. Caregiver will continue to monitor weight and appetite and notify office is weight begins to decrease. 7.  Follow up: 3 months   With PCP

## 2016-10-03 NOTE — Patient Instructions (Signed)
Diabetes and Standards of Medical Care   Diabetes is complicated. You may find that your diabetes team includes a dietitian, nurse, diabetes educator, eye doctor, and more. To help everyone know what is going on and to help you get the care you deserve, the following schedule of care was developed to help keep you on track. Below are the tests, exams, vaccines, medicines, education, and plans you will need.  Blood Glucose Goals Prior to meals = 80 - 130 Within 2 hours of the start of a meal = less than 180  HbA1c test (goal is less than 7.0% - your last value was %) This test shows how well you have controlled your glucose over the past 2 to 3 months. It is used to see if your diabetes management plan needs to be adjusted.   It is performed at least 2 times a year if you are meeting treatment goals.  It is performed 4 times a year if therapy has changed or if you are not meeting treatment goals.  Blood pressure test  This test is performed at every routine medical visit. The goal is less than 140/90 mmHg for most people, but 130/80 mmHg in some cases. Ask your health care provider about your goal.  Dental exam  Follow up with the dentist regularly.  Eye exam  If you are diagnosed with type 1 diabetes as a child, get an exam upon reaching the age of 10 years or older and have had diabetes for 3 to 5 years. Yearly eye exams are recommended after that initial eye exam.  If you are diagnosed with type 1 diabetes as an adult, get an exam within 5 years of diagnosis and then yearly.  If you are diagnosed with type 2 diabetes, get an exam as soon as possible after the diagnosis and then yearly.  Foot care exam  Visual foot exams are performed at every routine medical visit. The exams check for cuts, injuries, or other problems with the feet.  A comprehensive foot exam should be done yearly. This includes visual inspection as well as assessing foot pulses and testing for loss of  sensation.  Check your feet nightly for cuts, injuries, or other problems with your feet. Tell your health care provider if anything is not healing.  Kidney function test (urine microalbumin)  This test is performed once a year.  Type 1 diabetes: The first test is performed 5 years after diagnosis.  Type 2 diabetes: The first test is performed at the time of diagnosis.  A serum creatinine and estimated glomerular filtration rate (eGFR) test is done once a year to assess the level of chronic kidney disease (CKD), if present.  Lipid profile (cholesterol, HDL, LDL, triglycerides)  Performed every 5 years for most people.  The goal for LDL is less than 100 mg/dL. If you are at high risk, the goal is less than 70 mg/dL.  The goal for HDL is 40 mg/dL to 50 mg/dL for men and 50 mg/dL to 60 mg/dL for women. An HDL cholesterol of 60 mg/dL or higher gives some protection against heart disease.  The goal for triglycerides is less than 150 mg/dL.  Influenza vaccine, pneumococcal vaccine, and hepatitis B vaccine  The influenza vaccine is recommended yearly.  The pneumococcal vaccine is generally given once in a lifetime. However, there are some instances when another vaccination is recommended. Check with your health care provider.  The hepatitis B vaccine is also recommended for adults with diabetes.    Diabetes self-management education  Education is recommended at diagnosis and ongoing as needed.  Treatment plan  Your treatment plan is reviewed at every medical visit.  Document Released: 07/15/2009 Document Revised: 05/20/2013 Document Reviewed: 02/17/2013 ExitCare Patient Information 2014 [employer].   

## 2016-10-08 ENCOUNTER — Emergency Department (HOSPITAL_COMMUNITY): Payer: PPO

## 2016-10-08 ENCOUNTER — Inpatient Hospital Stay (HOSPITAL_COMMUNITY)
Admission: EM | Admit: 2016-10-08 | Discharge: 2016-10-20 | DRG: 871 | Disposition: A | Discharge status: Hospice | Payer: PPO | Attending: Internal Medicine | Admitting: Internal Medicine

## 2016-10-08 ENCOUNTER — Telehealth: Payer: Self-pay | Admitting: Family Medicine

## 2016-10-08 ENCOUNTER — Inpatient Hospital Stay (HOSPITAL_COMMUNITY): Payer: PPO

## 2016-10-08 ENCOUNTER — Telehealth: Payer: Self-pay | Admitting: Gastroenterology

## 2016-10-08 ENCOUNTER — Encounter (HOSPITAL_COMMUNITY): Payer: Self-pay | Admitting: *Deleted

## 2016-10-08 DIAGNOSIS — R627 Adult failure to thrive: Secondary | ICD-10-CM | POA: Diagnosis not present

## 2016-10-08 DIAGNOSIS — E118 Type 2 diabetes mellitus with unspecified complications: Secondary | ICD-10-CM

## 2016-10-08 DIAGNOSIS — S60212A Contusion of left wrist, initial encounter: Secondary | ICD-10-CM | POA: Diagnosis present

## 2016-10-08 DIAGNOSIS — K5909 Other constipation: Secondary | ICD-10-CM | POA: Diagnosis not present

## 2016-10-08 DIAGNOSIS — R4182 Altered mental status, unspecified: Secondary | ICD-10-CM | POA: Diagnosis not present

## 2016-10-08 DIAGNOSIS — J9601 Acute respiratory failure with hypoxia: Secondary | ICD-10-CM | POA: Diagnosis not present

## 2016-10-08 DIAGNOSIS — I481 Persistent atrial fibrillation: Secondary | ICD-10-CM | POA: Diagnosis not present

## 2016-10-08 DIAGNOSIS — N183 Chronic kidney disease, stage 3 (moderate): Secondary | ICD-10-CM | POA: Diagnosis not present

## 2016-10-08 DIAGNOSIS — E119 Type 2 diabetes mellitus without complications: Secondary | ICD-10-CM | POA: Diagnosis not present

## 2016-10-08 DIAGNOSIS — K8020 Calculus of gallbladder without cholecystitis without obstruction: Secondary | ICD-10-CM | POA: Diagnosis present

## 2016-10-08 DIAGNOSIS — I6789 Other cerebrovascular disease: Secondary | ICD-10-CM | POA: Diagnosis present

## 2016-10-08 DIAGNOSIS — Z66 Do not resuscitate: Secondary | ICD-10-CM | POA: Diagnosis present

## 2016-10-08 DIAGNOSIS — R1031 Right lower quadrant pain: Secondary | ICD-10-CM | POA: Diagnosis not present

## 2016-10-08 DIAGNOSIS — Z85528 Personal history of other malignant neoplasm of kidney: Secondary | ICD-10-CM

## 2016-10-08 DIAGNOSIS — R062 Wheezing: Secondary | ICD-10-CM

## 2016-10-08 DIAGNOSIS — N179 Acute kidney failure, unspecified: Secondary | ICD-10-CM | POA: Diagnosis present

## 2016-10-08 DIAGNOSIS — G4700 Insomnia, unspecified: Secondary | ICD-10-CM | POA: Diagnosis present

## 2016-10-08 DIAGNOSIS — K862 Cyst of pancreas: Secondary | ICD-10-CM | POA: Diagnosis present

## 2016-10-08 DIAGNOSIS — Z7189 Other specified counseling: Secondary | ICD-10-CM

## 2016-10-08 DIAGNOSIS — I10 Essential (primary) hypertension: Secondary | ICD-10-CM | POA: Diagnosis not present

## 2016-10-08 DIAGNOSIS — J181 Lobar pneumonia, unspecified organism: Secondary | ICD-10-CM | POA: Diagnosis not present

## 2016-10-08 DIAGNOSIS — M7989 Other specified soft tissue disorders: Secondary | ICD-10-CM | POA: Diagnosis not present

## 2016-10-08 DIAGNOSIS — E785 Hyperlipidemia, unspecified: Secondary | ICD-10-CM | POA: Diagnosis present

## 2016-10-08 DIAGNOSIS — Z905 Acquired absence of kidney: Secondary | ICD-10-CM

## 2016-10-08 DIAGNOSIS — I4891 Unspecified atrial fibrillation: Secondary | ICD-10-CM | POA: Diagnosis not present

## 2016-10-08 DIAGNOSIS — Z515 Encounter for palliative care: Secondary | ICD-10-CM | POA: Diagnosis not present

## 2016-10-08 DIAGNOSIS — Z7401 Bed confinement status: Secondary | ICD-10-CM

## 2016-10-08 DIAGNOSIS — K219 Gastro-esophageal reflux disease without esophagitis: Secondary | ICD-10-CM | POA: Diagnosis not present

## 2016-10-08 DIAGNOSIS — K829 Disease of gallbladder, unspecified: Secondary | ICD-10-CM | POA: Diagnosis not present

## 2016-10-08 DIAGNOSIS — Z91030 Bee allergy status: Secondary | ICD-10-CM

## 2016-10-08 DIAGNOSIS — G9340 Encephalopathy, unspecified: Secondary | ICD-10-CM | POA: Diagnosis not present

## 2016-10-08 DIAGNOSIS — I472 Ventricular tachycardia: Secondary | ICD-10-CM | POA: Diagnosis not present

## 2016-10-08 DIAGNOSIS — R680 Hypothermia, not associated with low environmental temperature: Secondary | ICD-10-CM | POA: Diagnosis not present

## 2016-10-08 DIAGNOSIS — S299XXA Unspecified injury of thorax, initial encounter: Secondary | ICD-10-CM | POA: Diagnosis not present

## 2016-10-08 DIAGNOSIS — E041 Nontoxic single thyroid nodule: Secondary | ICD-10-CM

## 2016-10-08 DIAGNOSIS — R52 Pain, unspecified: Secondary | ICD-10-CM

## 2016-10-08 DIAGNOSIS — R079 Chest pain, unspecified: Secondary | ICD-10-CM | POA: Diagnosis not present

## 2016-10-08 DIAGNOSIS — I2510 Atherosclerotic heart disease of native coronary artery without angina pectoris: Secondary | ICD-10-CM | POA: Diagnosis not present

## 2016-10-08 DIAGNOSIS — R109 Unspecified abdominal pain: Secondary | ICD-10-CM | POA: Diagnosis not present

## 2016-10-08 DIAGNOSIS — S6992XA Unspecified injury of left wrist, hand and finger(s), initial encounter: Secondary | ICD-10-CM | POA: Diagnosis not present

## 2016-10-08 DIAGNOSIS — R000 Tachycardia, unspecified: Secondary | ICD-10-CM

## 2016-10-08 DIAGNOSIS — M19032 Primary osteoarthritis, left wrist: Secondary | ICD-10-CM | POA: Diagnosis present

## 2016-10-08 DIAGNOSIS — R29712 NIHSS score 12: Secondary | ICD-10-CM | POA: Diagnosis not present

## 2016-10-08 DIAGNOSIS — I248 Other forms of acute ischemic heart disease: Secondary | ICD-10-CM | POA: Diagnosis present

## 2016-10-08 DIAGNOSIS — E1122 Type 2 diabetes mellitus with diabetic chronic kidney disease: Secondary | ICD-10-CM | POA: Diagnosis not present

## 2016-10-08 DIAGNOSIS — R6520 Severe sepsis without septic shock: Secondary | ICD-10-CM | POA: Diagnosis not present

## 2016-10-08 DIAGNOSIS — E042 Nontoxic multinodular goiter: Secondary | ICD-10-CM | POA: Diagnosis not present

## 2016-10-08 DIAGNOSIS — G9341 Metabolic encephalopathy: Secondary | ICD-10-CM | POA: Diagnosis present

## 2016-10-08 DIAGNOSIS — I129 Hypertensive chronic kidney disease with stage 1 through stage 4 chronic kidney disease, or unspecified chronic kidney disease: Secondary | ICD-10-CM | POA: Diagnosis present

## 2016-10-08 DIAGNOSIS — J189 Pneumonia, unspecified organism: Secondary | ICD-10-CM

## 2016-10-08 DIAGNOSIS — I4892 Unspecified atrial flutter: Secondary | ICD-10-CM | POA: Diagnosis not present

## 2016-10-08 DIAGNOSIS — E1140 Type 2 diabetes mellitus with diabetic neuropathy, unspecified: Secondary | ICD-10-CM | POA: Diagnosis not present

## 2016-10-08 DIAGNOSIS — R100 Acute abdomen: Secondary | ICD-10-CM | POA: Diagnosis not present

## 2016-10-08 DIAGNOSIS — A419 Sepsis, unspecified organism: Secondary | ICD-10-CM

## 2016-10-08 DIAGNOSIS — Z7952 Long term (current) use of systemic steroids: Secondary | ICD-10-CM

## 2016-10-08 DIAGNOSIS — M25532 Pain in left wrist: Secondary | ICD-10-CM | POA: Diagnosis not present

## 2016-10-08 DIAGNOSIS — I639 Cerebral infarction, unspecified: Secondary | ICD-10-CM | POA: Diagnosis present

## 2016-10-08 DIAGNOSIS — I69928 Other speech and language deficits following unspecified cerebrovascular disease: Secondary | ICD-10-CM

## 2016-10-08 DIAGNOSIS — Z880 Allergy status to penicillin: Secondary | ICD-10-CM

## 2016-10-08 DIAGNOSIS — R1011 Right upper quadrant pain: Secondary | ICD-10-CM | POA: Diagnosis not present

## 2016-10-08 DIAGNOSIS — K5900 Constipation, unspecified: Secondary | ICD-10-CM | POA: Diagnosis not present

## 2016-10-08 DIAGNOSIS — E876 Hypokalemia: Secondary | ICD-10-CM | POA: Diagnosis present

## 2016-10-08 DIAGNOSIS — R1030 Lower abdominal pain, unspecified: Secondary | ICD-10-CM | POA: Diagnosis not present

## 2016-10-08 DIAGNOSIS — Z951 Presence of aortocoronary bypass graft: Secondary | ICD-10-CM

## 2016-10-08 DIAGNOSIS — N281 Cyst of kidney, acquired: Secondary | ICD-10-CM | POA: Diagnosis not present

## 2016-10-08 DIAGNOSIS — R531 Weakness: Secondary | ICD-10-CM | POA: Diagnosis not present

## 2016-10-08 DIAGNOSIS — R34 Anuria and oliguria: Secondary | ICD-10-CM

## 2016-10-08 DIAGNOSIS — R29810 Facial weakness: Secondary | ICD-10-CM | POA: Diagnosis not present

## 2016-10-08 HISTORY — DX: Pneumonia, unspecified organism: J18.9

## 2016-10-08 LAB — CBC
HEMATOCRIT: 39.2 % (ref 36.0–46.0)
Hemoglobin: 13 g/dL (ref 12.0–15.0)
MCH: 32.9 pg (ref 26.0–34.0)
MCHC: 33.2 g/dL (ref 30.0–36.0)
MCV: 99.2 fL (ref 78.0–100.0)
Platelets: 266 10*3/uL (ref 150–400)
RBC: 3.95 MIL/uL (ref 3.87–5.11)
RDW: 14.3 % (ref 11.5–15.5)
WBC: 23.9 10*3/uL — ABNORMAL HIGH (ref 4.0–10.5)

## 2016-10-08 LAB — DIFFERENTIAL
BASOS ABS: 0 10*3/uL (ref 0.0–0.1)
Basophils Relative: 0 %
EOS PCT: 0 %
Eosinophils Absolute: 0 10*3/uL (ref 0.0–0.7)
LYMPHS ABS: 1 10*3/uL (ref 0.7–4.0)
LYMPHS PCT: 4 %
MONOS PCT: 5 %
Monocytes Absolute: 1.1 10*3/uL — ABNORMAL HIGH (ref 0.1–1.0)
NEUTROS PCT: 91 %
Neutro Abs: 21.8 10*3/uL — ABNORMAL HIGH (ref 1.7–7.7)

## 2016-10-08 LAB — URINALYSIS, ROUTINE W REFLEX MICROSCOPIC
Bilirubin Urine: NEGATIVE
Glucose, UA: NEGATIVE mg/dL
Hgb urine dipstick: NEGATIVE
KETONES UR: NEGATIVE mg/dL
LEUKOCYTES UA: NEGATIVE
NITRITE: NEGATIVE
PH: 5 (ref 5.0–8.0)
Protein, ur: NEGATIVE mg/dL
SPECIFIC GRAVITY, URINE: 1.032 — AB (ref 1.005–1.030)

## 2016-10-08 LAB — CBC WITH DIFFERENTIAL/PLATELET
Basophils Absolute: 0 10*3/uL (ref 0.0–0.1)
Basophils Relative: 0 %
EOS PCT: 0 %
Eosinophils Absolute: 0 10*3/uL (ref 0.0–0.7)
HEMATOCRIT: 34.9 % — AB (ref 36.0–46.0)
Hemoglobin: 11.4 g/dL — ABNORMAL LOW (ref 12.0–15.0)
LYMPHS PCT: 8 %
Lymphs Abs: 1.4 10*3/uL (ref 0.7–4.0)
MCH: 32.3 pg (ref 26.0–34.0)
MCHC: 32.7 g/dL (ref 30.0–36.0)
MCV: 98.9 fL (ref 78.0–100.0)
MONO ABS: 1.1 10*3/uL — AB (ref 0.1–1.0)
MONOS PCT: 7 %
NEUTROS ABS: 13.7 10*3/uL — AB (ref 1.7–7.7)
Neutrophils Relative %: 85 %
Platelets: 246 10*3/uL (ref 150–400)
RBC: 3.53 MIL/uL — ABNORMAL LOW (ref 3.87–5.11)
RDW: 14.8 % (ref 11.5–15.5)
WBC: 16.2 10*3/uL — ABNORMAL HIGH (ref 4.0–10.5)

## 2016-10-08 LAB — COMPREHENSIVE METABOLIC PANEL
ALT: 10 U/L — ABNORMAL LOW (ref 14–54)
ALT: 9 U/L — ABNORMAL LOW (ref 14–54)
ANION GAP: 13 (ref 5–15)
ANION GAP: 11 (ref 5–15)
AST: 16 U/L (ref 15–41)
AST: 16 U/L (ref 15–41)
Albumin: 3.3 g/dL — ABNORMAL LOW (ref 3.5–5.0)
Albumin: 2.8 g/dL — ABNORMAL LOW (ref 3.5–5.0)
Alkaline Phosphatase: 52 U/L (ref 38–126)
Alkaline Phosphatase: 46 U/L (ref 38–126)
BILIRUBIN TOTAL: 1.9 mg/dL — AB (ref 0.3–1.2)
BUN: 21 mg/dL — ABNORMAL HIGH (ref 6–20)
BUN: 20 mg/dL (ref 6–20)
CHLORIDE: 103 mmol/L (ref 101–111)
CO2: 23 mmol/L (ref 22–32)
CO2: 22 mmol/L (ref 22–32)
Calcium: 9.4 mg/dL (ref 8.9–10.3)
Calcium: 8.4 mg/dL — ABNORMAL LOW (ref 8.9–10.3)
Chloride: 107 mmol/L (ref 101–111)
Creatinine, Ser: 1.14 mg/dL — ABNORMAL HIGH (ref 0.44–1.00)
Creatinine, Ser: 1 mg/dL (ref 0.44–1.00)
GFR calc non Af Amer: 42 mL/min — ABNORMAL LOW (ref >60)
GFR, EST AFRICAN AMERICAN: 49 mL/min — AB (ref >60)
GFR, EST AFRICAN AMERICAN: 57 mL/min — AB (ref >60)
GFR, EST NON AFRICAN AMERICAN: 50 mL/min — AB (ref >60)
Glucose, Bld: 134 mg/dL — ABNORMAL HIGH (ref 65–99)
Glucose, Bld: 126 mg/dL — ABNORMAL HIGH (ref 65–99)
POTASSIUM: 3.6 mmol/L (ref 3.5–5.1)
Potassium: 3.4 mmol/L — ABNORMAL LOW (ref 3.5–5.1)
SODIUM: 139 mmol/L (ref 135–145)
Sodium: 140 mmol/L (ref 135–145)
TOTAL PROTEIN: 5.7 g/dL — AB (ref 6.5–8.1)
Total Bilirubin: 2.1 mg/dL — ABNORMAL HIGH (ref 0.3–1.2)
Total Protein: 6.2 g/dL — ABNORMAL LOW (ref 6.5–8.1)

## 2016-10-08 LAB — I-STAT CHEM 8, ED
BUN: 26 mg/dL — ABNORMAL HIGH (ref 6–20)
CHLORIDE: 101 mmol/L (ref 101–111)
Calcium, Ion: 1.16 mmol/L (ref 1.15–1.40)
Creatinine, Ser: 1.2 mg/dL — ABNORMAL HIGH (ref 0.44–1.00)
Glucose, Bld: 139 mg/dL — ABNORMAL HIGH (ref 65–99)
HEMATOCRIT: 39 % (ref 36.0–46.0)
HEMOGLOBIN: 13.3 g/dL (ref 12.0–15.0)
POTASSIUM: 3.4 mmol/L — AB (ref 3.5–5.1)
Sodium: 140 mmol/L (ref 135–145)
TCO2: 27 mmol/L (ref 0–100)

## 2016-10-08 LAB — I-STAT CG4 LACTIC ACID, ED: LACTIC ACID, VENOUS: 2.08 mmol/L — AB (ref 0.5–1.9)

## 2016-10-08 LAB — STREP PNEUMONIAE URINARY ANTIGEN: Strep Pneumo Urinary Antigen: NEGATIVE

## 2016-10-08 LAB — PROTIME-INR
INR: 1.12
INR: 1.24
PROTHROMBIN TIME: 15.7 s — AB (ref 11.4–15.2)
Prothrombin Time: 14.4 seconds (ref 11.4–15.2)

## 2016-10-08 LAB — APTT
aPTT: 34 seconds (ref 24–36)
aPTT: 38 seconds — ABNORMAL HIGH (ref 24–36)

## 2016-10-08 LAB — I-STAT TROPONIN, ED: Troponin i, poc: 0.04 ng/mL (ref 0.00–0.08)

## 2016-10-08 LAB — PROCALCITONIN: PROCALCITONIN: 0.19 ng/mL

## 2016-10-08 LAB — CBG MONITORING, ED: GLUCOSE-CAPILLARY: 108 mg/dL — AB (ref 65–99)

## 2016-10-08 LAB — TROPONIN I: Troponin I: 0.06 ng/mL (ref <0.03)

## 2016-10-08 LAB — LACTIC ACID, PLASMA: LACTIC ACID, VENOUS: 2.1 mmol/L — AB (ref 0.5–1.9)

## 2016-10-08 MED ORDER — HEPARIN SODIUM (PORCINE) 5000 UNIT/ML IJ SOLN
5000.0000 [IU] | Freq: Three times a day (TID) | INTRAMUSCULAR | Status: DC
  Administered 2016-10-08 – 2016-10-12 (×10): 5000 [IU] via SUBCUTANEOUS
  Filled 2016-10-08 (×12): qty 1

## 2016-10-08 MED ORDER — ONDANSETRON HCL 4 MG PO TABS: 4.0000 mg | ORAL_TABLET | Freq: Four times a day (QID) | ORAL | Status: DC | PRN

## 2016-10-08 MED ORDER — SODIUM CHLORIDE 0.9 % IV SOLN
INTRAVENOUS | Status: DC
  Administered 2016-10-08 – 2016-10-10 (×5): via INTRAVENOUS
  Administered 2016-10-11: 100 mL/h via INTRAVENOUS

## 2016-10-08 MED ORDER — ONDANSETRON HCL 4 MG/2ML IJ SOLN: 4.0000 mg | Freq: Four times a day (QID) | INTRAMUSCULAR | Status: DC | PRN

## 2016-10-08 MED ORDER — BISACODYL 5 MG PO TBEC: 5.0000 mg | DELAYED_RELEASE_TABLET | Freq: Every day | ORAL | Status: DC | PRN

## 2016-10-08 MED ORDER — VANCOMYCIN HCL IN DEXTROSE 1-5 GM/200ML-% IV SOLN
1000.0000 mg | Freq: Once | INTRAVENOUS | Status: AC
  Administered 2016-10-08: 1000 mg via INTRAVENOUS
  Filled 2016-10-08: qty 200

## 2016-10-08 MED ORDER — POTASSIUM CHLORIDE 20 MEQ/15ML (10%) PO SOLN
20.0000 meq | Freq: Three times a day (TID) | ORAL | Status: AC
  Administered 2016-10-08 – 2016-10-09 (×3): 20 meq via ORAL
  Filled 2016-10-08 (×3): qty 15

## 2016-10-08 MED ORDER — SODIUM CHLORIDE 0.9 % IV SOLN
INTRAVENOUS | Status: AC
  Administered 2016-10-08: 100 mL/h via INTRAVENOUS

## 2016-10-08 MED ORDER — LEVOFLOXACIN IN D5W 750 MG/150ML IV SOLN: 750.0000 mg | Freq: Once | INTRAVENOUS | Status: DC

## 2016-10-08 MED ORDER — LEVOFLOXACIN IN D5W 750 MG/150ML IV SOLN
750.0000 mg | INTRAVENOUS | Status: DC
  Filled 2016-10-08: qty 150

## 2016-10-08 MED ORDER — ACETAMINOPHEN 325 MG PO TABS
650.0000 mg | ORAL_TABLET | Freq: Three times a day (TID) | ORAL | Status: DC | PRN
  Administered 2016-10-08 – 2016-10-18 (×16): 650 mg via ORAL
  Filled 2016-10-08 (×17): qty 2

## 2016-10-08 MED ORDER — IOPAMIDOL (ISOVUE-300) INJECTION 61%
INTRAVENOUS | Status: AC
  Administered 2016-10-08: 80 mL
  Filled 2016-10-08: qty 100

## 2016-10-08 MED ORDER — LINACLOTIDE 72 MCG PO CAPS
72.0000 ug | ORAL_CAPSULE | Freq: Every day | ORAL | Status: DC | PRN
  Filled 2016-10-08 (×2): qty 1

## 2016-10-08 MED ORDER — ASPIRIN EC 81 MG PO TBEC
81.0000 mg | DELAYED_RELEASE_TABLET | Freq: Every day | ORAL | Status: DC
  Administered 2016-10-09 – 2016-10-20 (×12): 81 mg via ORAL
  Filled 2016-10-08 (×12): qty 1

## 2016-10-08 MED ORDER — LORAZEPAM 1 MG PO TABS
2.0000 mg | ORAL_TABLET | Freq: Every day | ORAL | Status: DC
  Administered 2016-10-08: 2 mg via ORAL
  Filled 2016-10-08: qty 2

## 2016-10-08 MED ORDER — SODIUM CHLORIDE 0.9 % IV SOLN
500.0000 mg | INTRAVENOUS | Status: DC
  Administered 2016-10-09 – 2016-10-11 (×3): 500 mg via INTRAVENOUS
  Filled 2016-10-08 (×3): qty 500

## 2016-10-08 MED ORDER — GABAPENTIN 300 MG PO CAPS
300.0000 mg | ORAL_CAPSULE | Freq: Three times a day (TID) | ORAL | Status: DC
Start: 2016-10-08 — End: 2016-10-20
  Administered 2016-10-08 – 2016-10-20 (×34): 300 mg via ORAL
  Filled 2016-10-08 (×34): qty 1

## 2016-10-08 MED ORDER — LORAZEPAM 1 MG PO TABS: 1.0000 mg | ORAL_TABLET | Freq: Two times a day (BID) | ORAL | Status: DC | PRN

## 2016-10-08 MED ORDER — ATORVASTATIN CALCIUM 20 MG PO TABS
20.0000 mg | ORAL_TABLET | Freq: Every day | ORAL | Status: DC
  Administered 2016-10-08 – 2016-10-19 (×12): 20 mg via ORAL
  Filled 2016-10-08 (×12): qty 1

## 2016-10-08 MED ORDER — AMLODIPINE BESYLATE 5 MG PO TABS
5.0000 mg | ORAL_TABLET | Freq: Every day | ORAL | Status: DC
  Administered 2016-10-09 – 2016-10-16 (×8): 5 mg via ORAL
  Filled 2016-10-08 (×8): qty 1

## 2016-10-08 MED ORDER — IOPAMIDOL (ISOVUE-300) INJECTION 61%
INTRAVENOUS | Status: AC
  Filled 2016-10-08: qty 100

## 2016-10-08 MED ORDER — SODIUM CHLORIDE 0.9 % IV BOLUS (SEPSIS)
1000.0000 mL | Freq: Once | INTRAVENOUS | Status: AC
  Administered 2016-10-08: 1000 mL via INTRAVENOUS

## 2016-10-08 MED ORDER — SODIUM CHLORIDE 0.9 % IV BOLUS (SEPSIS)
500.0000 mL | Freq: Once | INTRAVENOUS | Status: AC
  Administered 2016-10-08: 500 mL via INTRAVENOUS

## 2016-10-08 MED ORDER — FENTANYL 50 MCG/HR TD PT72
50.0000 ug | MEDICATED_PATCH | TRANSDERMAL | Status: DC
  Administered 2016-10-08 – 2016-10-17 (×4): 50 ug via TRANSDERMAL
  Filled 2016-10-08: qty 1
  Filled 2016-10-08 (×2): qty 2
  Filled 2016-10-08: qty 1

## 2016-10-08 MED ORDER — SODIUM CHLORIDE 0.9% FLUSH
3.0000 mL | Freq: Two times a day (BID) | INTRAVENOUS | Status: DC
  Administered 2016-10-08 – 2016-10-19 (×17): 3 mL via INTRAVENOUS

## 2016-10-08 MED ORDER — VANCOMYCIN HCL IN DEXTROSE 1-5 GM/200ML-% IV SOLN: 1000.0000 mg | Freq: Once | INTRAVENOUS | Status: DC

## 2016-10-08 MED ORDER — TEMAZEPAM 15 MG PO CAPS
30.0000 mg | ORAL_CAPSULE | Freq: Every day | ORAL | Status: DC
  Administered 2016-10-08 – 2016-10-19 (×12): 30 mg via ORAL
  Filled 2016-10-08 (×12): qty 2

## 2016-10-08 MED ORDER — SENNOSIDES-DOCUSATE SODIUM 8.6-50 MG PO TABS
2.0000 | ORAL_TABLET | Freq: Every day | ORAL | Status: DC
  Administered 2016-10-09 – 2016-10-20 (×12): 2 via ORAL
  Filled 2016-10-08 (×12): qty 2

## 2016-10-08 MED ORDER — SENNA 8.6 MG PO TABS
1.0000 | ORAL_TABLET | Freq: Two times a day (BID) | ORAL | Status: DC
  Administered 2016-10-08: 8.6 mg via ORAL
  Filled 2016-10-08: qty 1

## 2016-10-08 MED ORDER — SULFACETAMIDE SODIUM 10 % OP SOLN
1.0000 [drp] | OPHTHALMIC | Status: DC
  Administered 2016-10-09 – 2016-10-20 (×53): 1 [drp] via OPHTHALMIC
  Filled 2016-10-08 (×4): qty 15

## 2016-10-08 MED ORDER — LEVOFLOXACIN IN D5W 750 MG/150ML IV SOLN
750.0000 mg | Freq: Once | INTRAVENOUS | Status: AC
  Administered 2016-10-08: 750 mg via INTRAVENOUS
  Filled 2016-10-08: qty 150

## 2016-10-08 NOTE — ED Notes (Signed)
Contacted MRI - pt is on her way back

## 2016-10-08 NOTE — Progress Notes (Signed)
Pharmacy Antibiotic Note  Amanda Hubbard is a 83 y.o. female admitted on 10/08/2016 with pneumonia.  Pharmacy has been consulted for levaquin and vancomycin dosing.  Patient received levaquin 750mg  and vancomycin 1g IV once in the ED.  Plan: Vancomycin 500mg  IV every 24 hours.  Goal trough 15-20 mcg/mL.  Levaquin 750mg  IV q48h Monitor culture data, renal function and clinical course VT at SS prn     Temp (24hrs), Avg:100.2 F (37.9 C), Min:100.2 F (37.9 C), Max:100.2 F (37.9 C)   Recent Labs Lab 10/08/16 1240 10/08/16 1300  WBC 23.9*  --   CREATININE 1.14* 1.20*  LATICACIDVEN  --  2.08*    CrCl cannot be calculated (Unknown ideal weight.).    Allergies  Allergen Reactions  . Penicillins      Andrey Cota. Diona Foley, PharmD, [place] Clinical Pharmacist Pager [phone] 10/08/2016 12:38 PM

## 2016-10-08 NOTE — H&P (Signed)
History and Physical    Amanda Hubbard [id number] DOB: 02-03-1928 DOA: 10/08/2016  PCP: Redge Gainer, MD Patient coming from: Home  Chief Complaint:   HPI: Amanda Hubbard is a 83 y.o. female with medical history significant of CVA three months ago manifested by speech difficulty, coronary artery disease, status post CABG in 2002, hypertension, dyslipidemia who was brought to the emergency department with complaints of worsening speech despite ongoing speech therapy at home and altered mental status. Family reported that patient has been acting differently than hiernormal self over the past few days. Her husband noted left sided facial droop last night and she also complained of left wrist pain. Upon arrival to the emergency department initially code stroke was activated, but her head CT was negative for acute stroke or any other acute abnormality and symptoms gradually improved. The husband reported that sometime in November she was diagnosed with cholecystitis and since then is still awaiting for outpatient surgical follow-up. He requested patient be seen by surgery while hospitalized. Patient complained of epigastric pain, but denied nausea, vomiting, SOB or chest pain  ED Course: Upon arrival patient was febrile with temperature 100.45F, tachycardic with heart rate fluctuating between 105 and 141, tachypneic with respirations of 22-24. Chest x-ray showed retrocardiac opacity compatible with early infiltrate versus atelectasis. Blood work showed elevated white blood cells count-23,900, mild hypokalemia, renal insufficiency with creatinine rising from 1.14-1.20 in the ED, elevated lactic acid 2.08 Code sepsis was initiated in the ED and patient was placed on IV fluids, Vancomycin and Levaquin. EKG was abnormal showing anterolateral TW inversion, first set of troponin was normal 0.04  Review of Systems: As per HPI, otherwise 10 point review of systems negative.   Ambulatory Status:  bedbound  Past Medical History:  Diagnosis Date  . CAD (coronary artery disease)    s/p CABG in 2002  . Dyslipidemia   . Female bladder prolapse    around 2013  . Hypertension   . Insomnia   . Mitral valve prolapse   . Osteopenia   . Osteoporosis     Past Surgical History:  Procedure Laterality Date  . BLADDER SURGERY    . CORONARY ARTERY BYPASS GRAFT  2002   at [facility] to the LAD, SVG to obtuse marginal 1, and SVG to PDA,   . NASAL FRACTURE SURGERY    . NEPHRECTOMY  1980's   Renal cell cancer s/p  . VESICOVAGINAL FISTULA CLOSURE W/ TAH      Social History   Social History  . Marital status: Married    Spouse name: N/A  . Number of children: 3  . Years of education: N/A   Occupational History  . retired English as a second language teacher    Social History Main Topics  . Smoking status: Never Smoker  . Smokeless tobacco: Never Used  . Alcohol use No  . Drug use: No  . Sexual activity: Not on file   Other Topics Concern  . Not on file   Social History Narrative   Lives with husband.     Allergies  Allergen Reactions  . Penicillins     Family History  Problem Relation Age of Onset  . Cancer Maternal Grandmother     ?    Prior to Admission medications   Medication Sig Start Date End Date Taking? Authorizing Provider  acetaminophen (ACETAMINOPHEN 8 HOUR) 650 MG CR tablet Take 650 mg by mouth every 8 (eight) hours as needed for pain.    Historical  Provider, MD  amLODipine (NORVASC) 5 MG tablet Take 1 tablet (5 mg total) by mouth daily. 05/07/16   Fransisca Kaufmann Dettinger, MD  atorvastatin (LIPITOR) 20 MG tablet Take 1 tablet (20 mg total) by mouth daily. 08/27/16   Chipper Herb, MD  blood glucose meter kit and supplies Dispense based on patient and insurance preference. Use up to four times daily as directed. (FOR ICD-9 250.00, 250.01). 09/25/16   Eustaquio Maize, MD  doxycycline (VIBRA-TABS) 100 MG tablet Take 1 tablet (100 mg total) by mouth 2 (two) times  daily. 10/03/16   Chipper Herb, MD  fentaNYL (DURAGESIC - DOSED MCG/HR) 50 MCG/HR Place 1 patch (50 mcg total) onto the skin every 3 (three) days. Do not fill until Nov. 23, 2017 09/25/16   Chipper Herb, MD  gabapentin (NEURONTIN) 300 MG capsule TAKE ONE (1) CAPSULE THREE (3) TIMES EACH DAY 09/07/16   Wardell Honour, MD  hydrochlorothiazide (HYDRODIURIL) 12.5 MG tablet Take 1 tablet (12.5 mg total) by mouth daily. 08/09/16   Claretta Fraise, MD  linaclotide [facility]) 72 MCG capsule Take 1 capsule (72 mcg total) by mouth daily before breakfast. 08/14/16   Wardell Honour, MD  LORazepam (ATIVAN) 1 MG tablet Take 1 tablet (1 mg total) by mouth 2 (two) times daily. 08/15/16   Wardell Honour, MD  LORazepam (ATIVAN) 2 MG tablet TAKE ONE (1) TABLET AT BEDTIME 10/03/16   Chipper Herb, MD  predniSONE (DELTASONE) 5 MG tablet Take 1 tablet daily with food for 1 month, then decrease to 1/2 tablet daily 10/03/16   Chipper Herb, MD  SENEXON-S 8.6-50 MG tablet TAKE 4 TABLETS BY MOUTH 3 TIMES A DAY UNTIL BOWEL MOVEMENT. 01/31/16   Chipper Herb, MD  sulfacetamide (BLEPH-10) 10 % ophthalmic solution Place 1 drop into the right eye every 4 (four) hours. 10/03/16   Chipper Herb, MD  temazepam (RESTORIL) 30 MG capsule Take 1 capsule (30 mg total) by mouth at bedtime. 07/25/16   Claretta Fraise, MD    Physical Exam: Vitals:   10/08/16 1230 10/08/16 1300 10/08/16 1330 10/08/16 1400  BP: 127/73 130/69 137/67 131/70  Pulse: 108 119 109 (!) 141  Resp: _0 Temp:      TempSrc:      SpO2: 95% 95% 97% 96%     General: Elderly, ill looking Caucasian female who appears calm  Eyes: PERRLA, EOMI, normal lids, iris ENT:  grossly normal hearing, lips & tongue, mucous membranes moist and intact Neck: no lymphoadenopathy, masses or thyromegaly Cardiovascular: RRR, no m/r/g. No JVD, carotid bruits. No LE edema.  Respiratory: bilateral no wheezes, rales, rhonchi or cracles. Normal respiratory effort. No accessory  muscle use observed Abdomen: soft, non-tender, non-distended, no organomegaly or masses appreciated. BS present in all quadrants Skin: Thin and fragile with multiple areas of subcutaneous hemorrhages, no rash, ulcers or induration seen on limited exam Musculoskeletal: grossly normal tone BUE/BLE, good ROM except left increased that appears swollen, exquisitely tender with passive movements ; no bony abnormality or joint deformities observed Psychiatric: grossly normal mood and affect, speech fluent and appropriate, alert and oriented x3 Neurologic: CN II-XII grossly intact, moves all extremities in coordinated fashion, sensation intact  Labs on Admission: I have personally reviewed following labs and imaging studies  CBC, BMP  GFR: CrCl cannot be calculated (Unknown ideal weight.).   Creatinine Clearance: CrCl cannot be calculated (Unknown ideal weight.).   Radiological Exams on Admission: Dg  Chest 2 View  Result Date: 10/08/2016 CLINICAL DATA:  Low grade temperature, possible infection EXAM: CHEST  2 VIEW COMPARISON:  07/09/2016 FINDINGS: Cardiomediastinal silhouette is stable. There is streaky left base retrocardiac atelectasis or early infiltrate. No pulmonary edema. Osteopenia and degenerative changes thoracic spine. IMPRESSION: Streaky left base retrocardiac atelectasis or early infiltrate. No pulmonary edema. Electronically Signed   By: Lahoma Crocker M.D.   On: 10/08/2016 11:46   Ct Head Wo Contrast  Result Date: 10/08/2016 CLINICAL DATA:  Left facial droop. Left-sided weakness beginning last night. EXAM: CT HEAD WITHOUT CONTRAST TECHNIQUE: Contiguous axial images were obtained from the base of the skull through the vertex without intravenous contrast. COMPARISON:  07/09/2016. FINDINGS: Brain: There is atrophy and chronic small vessel disease changes. No acute intracranial abnormality. Specifically, no hemorrhage, hydrocephalus, mass lesion, acute infarction, or significant intracranial  injury. Vascular: No hyperdense vessel or unexpected calcification. Skull: No acute calvarial abnormality. Sinuses/Orbits: Visualized paranasal sinuses and mastoids clear. Orbital soft tissues unremarkable. Other: None IMPRESSION: No acute intracranial abnormality. Atrophy, chronic microvascular disease. Electronically Signed   By: Rolm Baptise M.D.   On: 10/08/2016 11:35    EKG: Independently reviewed - SR with ischemic St inversion in the anterolateral leads, no other EKG for camparison  Assessment/Plan Principal Problem:   Sepsis ([place]) Active Problems:   Dyslipidemia   Essential hypertension   Coronary atherosclerosis   Type 2 diabetes mellitus, controlled ([place])   CAP (community acquired pneumonia)   Sepsis associated with CAP Continue IVF, monitor Lactic Acid, fever and WBC's count Treat underlying cause and follow up clinical progression  Metabolic encephalopathy - multifactorial, associated with underlying infection/sepsis and possibly enlarging recent stroke area Head CT is negative for acute hemorrhage or other abnormality Provide supportive care, monitor safety  CAP Continue IV antibiotic therapy Supplemental oxygen if needed Nebulizer treatments as scheduled  Cholecystitis diagnosed in November 2017 Patient underwent abdominal CT and result is still pending She might need inpatient surgical consult if CT shows signs of cholecystitis  Left wrist pain and swelling. Patient has hematoma of the left wrist Will do his left wrist x-ray to rule out underlying fracture Could be associated with Subcutaneous nerve compression by a hematoma   CAD status post CABG remotely. EKG on admission is abnormal and there is no other tracing available for comparison  Patient denied active chest pain Continue cycling cardiac enzymes  Acute kidney injury superimposed on chronic kidney disease Recheck renal indices in the morning after IV hydration  Hypertension On admission blood  pressure was borderline hypotensive, but it was fluid resuscitation improved, now 137/67 mmHg  Hypokalemia Replace and recheck  DVT prophylaxis: Heparin Code Status: DO NOT RESUSCITATE Family Communication: At bedside Disposition Plan: [place] called: None Admission status: Inpatient   York Grice, [place] Pager: [phone] [facility]  If 7PM-7AM, please contact night-coverage [email or web address] Password [email or web address]  10/08/2016, 3:03 PM

## 2016-10-08 NOTE — ED Notes (Signed)
ACTIVATED CODE SEPSIS WITH CARELINK

## 2016-10-08 NOTE — ED Notes (Signed)
Attempted to call report

## 2016-10-08 NOTE — ED Notes (Signed)
No addl blood draw,  Pt enroute to inpatient. 

## 2016-10-08 NOTE — ED Triage Notes (Addendum)
Pt in from home c/o L sided facial droop & L sided hand weakness, LSN last night @ 19:00-20:00, pt noted by husband to have deficits onset last night at that time, pt NSR in route, pt has Fentanyl patch on upon arrival to ED, pt Alert, denies taking blood thinners, per EMS pt rcvd 0.5 mg Ativan at home

## 2016-10-08 NOTE — Telephone Encounter (Signed)
Gave call to triage °

## 2016-10-08 NOTE — ED Provider Notes (Signed)
[facility] DEPT Provider Note  CSN: [id number] Arrival date & time: 10/08/16  1035  History   Chief Complaint Chief Complaint  Patient presents with  . Cerebrovascular Accident   HPI Amanda Hubbard is a 83 y.o. female. HPI   Patient with multiple past medical history allergies including recent stroke, coronary artery disease,  nonambulatory at baseline, hypertension the ER for evaluation of weakness and altered mental status. Per the family she has been acting different than normal over the past two days. Quite and having a more challenging time speaking. She has an inflamed gallbladder and was due to see [facility] today at 2 pm, the husband is requesting that she see the surgeon at today's visit. The husband is concerned that her left hand is hurting but patient denies this for me. They deny any recent fall or injury. They have not noticed her to be coughing at home, deny abnormal bleeding.    Past Medical History:  Diagnosis Date  . CAD (coronary artery disease)    s/p CABG in 2002  . Dyslipidemia   . Female bladder prolapse    around 2013  . Hypertension   . Insomnia   . Mitral valve prolapse   . Osteopenia   . Osteoporosis     Patient Active Problem List   Diagnosis Date Noted  . Type 2 diabetes mellitus, controlled ([place]) 10/03/2016  . Urinary tract infectious disease 05/16/2016  . Senile purpura ([place]) 02/22/2015  . DYSLIPIDEMIA 12/21/2008  . Essential hypertension 12/21/2008  . Coronary atherosclerosis 12/21/2008  . Mitral valve disorder 12/21/2008  . OSTEOPENIA 12/21/2008  . INSOMNIA 12/21/2008    Past Surgical History:  Procedure Laterality Date  . BLADDER SURGERY    . CORONARY ARTERY BYPASS GRAFT  2002   at [facility] of the [place] to the LAD, SVG to obtuse marginal 1, and SVG to PDA,   . NASAL FRACTURE SURGERY    . NEPHRECTOMY  1980's   Renal cell cancer s/p  . VESICOVAGINAL FISTULA CLOSURE W/ TAH      OB History    No data available        Home Medications    Prior to Admission medications   Medication Sig Start Date End Date Taking? Authorizing Provider  acetaminophen (ACETAMINOPHEN 8 HOUR) 650 MG CR tablet Take 650 mg by mouth every 8 (eight) hours as needed for pain.    Historical Provider, MD  amLODipine (NORVASC) 5 MG tablet Take 1 tablet (5 mg total) by mouth daily. 05/07/16   Fransisca Kaufmann Dettinger, MD  atorvastatin (LIPITOR) 20 MG tablet Take 1 tablet (20 mg total) by mouth daily. 08/27/16   Chipper Herb, MD  blood glucose meter kit and supplies Dispense based on patient and insurance preference. Use up to four times daily as directed. (FOR ICD-9 250.00, 250.01). 09/25/16   Eustaquio Maize, MD  doxycycline (VIBRA-TABS) 100 MG tablet Take 1 tablet (100 mg total) by mouth 2 (two) times daily. 10/03/16   Chipper Herb, MD  fentaNYL (DURAGESIC - DOSED MCG/HR) 50 MCG/HR Place 1 patch (50 mcg total) onto the skin every 3 (three) days. Do not fill until Nov. 23, 2017 09/25/16   Chipper Herb, MD  gabapentin (NEURONTIN) 300 MG capsule TAKE ONE (1) CAPSULE THREE (3) TIMES EACH DAY 09/07/16   Wardell Honour, MD  hydrochlorothiazide (HYDRODIURIL) 12.5 MG tablet Take 1 tablet (12.5 mg total) by mouth daily. 08/09/16   Claretta Fraise, MD  linaclotide Rolan Lipa) [phone]  MCG capsule Take 1 capsule (72 mcg total) by mouth daily before breakfast. 08/14/16   Wardell Honour, MD  LORazepam (ATIVAN) 1 MG tablet Take 1 tablet (1 mg total) by mouth 2 (two) times daily. 08/15/16   Wardell Honour, MD  LORazepam (ATIVAN) 2 MG tablet TAKE ONE (1) TABLET AT BEDTIME 10/03/16   Chipper Herb, MD  predniSONE (DELTASONE) 5 MG tablet Take 1 tablet daily with food for 1 month, then decrease to 1/2 tablet daily 10/03/16   Chipper Herb, MD  SENEXON-S 8.6-50 MG tablet TAKE 4 TABLETS BY MOUTH 3 TIMES A DAY UNTIL BOWEL MOVEMENT. 01/31/16   Chipper Herb, MD  sulfacetamide (BLEPH-10) 10 % ophthalmic solution Place 1 drop into the right eye every 4 (four) hours.  10/03/16   Chipper Herb, MD  temazepam (RESTORIL) 30 MG capsule Take 1 capsule (30 mg total) by mouth at bedtime. 07/25/16   Claretta Fraise, MD    Family History Family History  Problem Relation Age of Onset  . Cancer Maternal Grandmother     ?    Social History Social History  Substance Use Topics  . Smoking status: Never Smoker  . Smokeless tobacco: Never Used  . Alcohol use No     Allergies   Penicillins   Review of Systems Review of Systems  Review of Systems All other systems negative except as documented in the HPI. All pertinent positives and negatives as reviewed in the HPI.  Physical Exam Updated Vital Signs BP 131/70   Pulse (!) 141   Temp 100.2 F (37.9 C) (Oral)   Resp 22   SpO2 96%   Physical Exam  Constitutional: She appears well-developed and well-nourished. No distress.  HENT:  Head: Normocephalic and atraumatic.  Eyes: Conjunctivae and EOM are normal. Pupils are equal, round, and reactive to light. No scleral icterus.  Neck: Normal range of motion. Neck supple.  Cardiovascular: Normal rate, regular rhythm, normal heart sounds and intact distal pulses.   Pulmonary/Chest: Effort normal. No respiratory distress. She has no wheezes. She has no rales. She exhibits no tenderness.  Abdominal: Soft. Bowel sounds are normal. There is no tenderness. There is no rigidity, no rebound, no guarding, no CVA tenderness and negative Murphy's sign.  Musculoskeletal: She exhibits no edema or tenderness.  Neurological: She is alert. She displays a negative Romberg sign.  Patient is alert and oriented to self. Unable to asses cranial nerves d/t pts inability to follow commands and baseline deficits.   Skin: Skin is warm and dry. No petechiae, no purpura and no rash noted. She is not diaphoretic.  Psychiatric: Her speech is not slurred.  Nursing note and vitals reviewed.   ED Treatments / Results  Labs (all labs ordered are listed, but only abnormal results are  displayed) Labs Reviewed  CBC - Abnormal; Notable for the following:       Result Value   WBC 23.9 (*)    All other components within normal limits  DIFFERENTIAL - Abnormal; Notable for the following:    Neutro Abs 21.8 (*)    Monocytes Absolute 1.1 (*)    All other components within normal limits  COMPREHENSIVE METABOLIC PANEL - Abnormal; Notable for the following:    Potassium 3.4 (*)    Glucose, Bld 134 (*)    BUN 21 (*)    Creatinine, Ser 1.14 (*)    Total Protein 6.2 (*)    Albumin 3.3 (*)    ALT 10 (*)  Total Bilirubin 2.1 (*)    GFR calc non Af Amer 42 (*)    GFR calc Af Amer 49 (*)    All other components within normal limits  I-STAT CHEM 8, ED - Abnormal; Notable for the following:    Potassium 3.4 (*)    BUN 26 (*)    Creatinine, Ser 1.20 (*)    Glucose, Bld 139 (*)    All other components within normal limits  I-STAT CG4 LACTIC ACID, ED - Abnormal; Notable for the following:    Lactic Acid, Venous 2.08 (*)    All other components within normal limits  CULTURE, BLOOD (ROUTINE X 2)  CULTURE, BLOOD (ROUTINE X 2)  URINE CULTURE  PROTIME-INR  APTT  URINALYSIS, ROUTINE W REFLEX MICROSCOPIC  I-STAT TROPOININ, ED    EKG  EKG Interpretation  Date/Time:  Monday October 08 2016 11:53:24 EST Ventricular Rate:  111 PR Interval:    QRS Duration: 102 QT Interval:  379 QTC Calculation: 515 R Axis:   41 Text Interpretation:  Atrial fibrillation RSR' in V1 or V2, right VCD or RVH Probable left ventricular hypertrophy Abnrm T, consider ischemia, anterolateral lds Prolonged QT interval When copared to prior, similar T wave inversions.  Abnormal ECG Confirmed by Sherry Ruffing MD, [place] [phone]) on 10/08/2016 12:02:34 PM       Radiology Dg Chest 2 View  Result Date: 10/08/2016 CLINICAL DATA:  Low grade temperature, possible infection EXAM: CHEST  2 VIEW COMPARISON:  07/09/2016 FINDINGS: Cardiomediastinal silhouette is stable. There is streaky left base retrocardiac  atelectasis or early infiltrate. No pulmonary edema. Osteopenia and degenerative changes thoracic spine. IMPRESSION: Streaky left base retrocardiac atelectasis or early infiltrate. No pulmonary edema. Electronically Signed  By: Lahoma Crocker M.D.   On: 10/08/2016 11:46   Ct Head Wo Contrast  Result Date: 10/08/2016 CLINICAL DATA:  Left facial droop. Left-sided weakness beginning last night. EXAM: CT HEAD WITHOUT CONTRAST TECHNIQUE: Contiguous axial images were obtained from the base of the skull through the vertex without intravenous contrast. COMPARISON:  07/09/2016. FINDINGS: Brain: There is atrophy and chronic small vessel disease changes. No acute intracranial abnormality. Specifically, no hemorrhage, hydrocephalus, mass lesion, acute infarction, or significant intracranial injury. Vascular: No hyperdense vessel or unexpected calcification. Skull: No acute calvarial abnormality. Sinuses/Orbits: Visualized paranasal sinuses and mastoids clear. Orbital soft tissues unremarkable. Other: None  IMPRESSION: No acute intracranial abnormality. Atrophy, chronic microvascular disease. Electronically Signed   By: Rolm Baptise M.D.   On: 10/08/2016 11:35    Procedures Procedures (including critical care time)  Medications Ordered in ED Medications  levofloxacin (LEVAQUIN) IVPB 750 mg (750 mg Intravenous New Bag/Given 10/08/16 1313)  iopamidol (ISOVUE-300) 61 % injection (not administered)  levofloxacin (LEVAQUIN) IVPB 750 mg (not administered)  vancomycin (VANCOCIN) 500 mg in sodium chloride 0.9 % 100 mL IVPB (not administered)  sodium chloride 0.9 % bolus 1,000 mL (1,000 mLs Intravenous New Bag/Given 10/08/16 1312)    And  sodium chloride 0.9 % bolus 500 mL (500 mLs Intravenous New Bag/Given 10/08/16 1312)  vancomycin (VANCOCIN) IVPB 1000 mg/200 mL premix (1,000 mg Intravenous New Bag/Given 10/08/16 1313)     Initial Impression / Assessment and Plan / ED Course  I have reviewed the triage vital signs and the  nursing notes.  Pertinent labs & imaging results that were available during my care of the patient were reviewed by me and considered in my medical decision making (see chart for details).  Clinical Course     CRITICAL  CARE Performed by: Linus Mako Total critical care time: 45 minutes Critical care time was exclusive of separately billable procedures and treating other patients. Critical care was necessary to treat or prevent imminent or life-threatening deterioration. Critical care was time spent personally by me on the following activities: development of treatment plan with patient and/or surrogate as well as nursing, discussions with consultants, evaluation of patient's response to treatment, examination of patient, obtaining history from patient or surrogate, ordering and performing treatments and interventions, ordering and review of laboratory studies, ordering and review of radiographic studies, pulse oximetry and re-evaluation of patient's condition.  CODE SEPSIS, protocol orders given, pt treated empirically for pneumonia. CT abd/pelv pending to evaluate gallbladder.  Inpatient admission, [facility] admits, [facility], Dr. Marily Memos, tele   Final Clinical Impressions(s) / ED Diagnoses   Final diagnoses:  Gallbladder disease  Community acquired pneumonia, unspecified laterality    New Prescriptions New Prescriptions   No medications on file     Delos Haring, PA-C 10/08/16 [place], MD 10/09/16 [phone]

## 2016-10-08 NOTE — Telephone Encounter (Signed)
Amanda Hubbard,    This patient you saw in 2014 for pancreatic cystic neoplasm was admitted for pneumonia and sepsis.  She is also disabled after a recent CVA. The [facility] admitting doc asked my opinion re: the mass because still present on imaging.   See CT report from yesterday - the lesion is unchanged in size since 2013. LFTs nml except bilirubin 2.1, which seems likely to be from sepsis.  Given the patient's age, condition and the lesion's stability for years, I advised against further workup such as EUS.  I wanted you to know in case this question is posed to you in the future.

## 2016-10-09 ENCOUNTER — Encounter (HOSPITAL_COMMUNITY): Payer: Self-pay | Admitting: General Practice

## 2016-10-09 DIAGNOSIS — J189 Pneumonia, unspecified organism: Secondary | ICD-10-CM

## 2016-10-09 HISTORY — DX: Pneumonia, unspecified organism: J18.9

## 2016-10-09 LAB — BLOOD CULTURE ID PANEL (REFLEXED)
ACINETOBACTER BAUMANNII: NOT DETECTED
CANDIDA KRUSEI: NOT DETECTED
CANDIDA PARAPSILOSIS: NOT DETECTED
Candida albicans: NOT DETECTED
Candida glabrata: NOT DETECTED
Candida tropicalis: NOT DETECTED
ENTEROCOCCUS SPECIES: NOT DETECTED
ESCHERICHIA COLI: NOT DETECTED
Enterobacter cloacae complex: NOT DETECTED
Enterobacteriaceae species: NOT DETECTED
HAEMOPHILUS INFLUENZAE: NOT DETECTED
KLEBSIELLA OXYTOCA: NOT DETECTED
Klebsiella pneumoniae: NOT DETECTED
LISTERIA MONOCYTOGENES: NOT DETECTED
METHICILLIN RESISTANCE: DETECTED — AB
Neisseria meningitidis: NOT DETECTED
PROTEUS SPECIES: NOT DETECTED
PSEUDOMONAS AERUGINOSA: NOT DETECTED
SERRATIA MARCESCENS: NOT DETECTED
STAPHYLOCOCCUS AUREUS BCID: NOT DETECTED
STAPHYLOCOCCUS SPECIES: DETECTED — AB
Streptococcus agalactiae: NOT DETECTED
Streptococcus pneumoniae: NOT DETECTED
Streptococcus pyogenes: NOT DETECTED
Streptococcus species: NOT DETECTED

## 2016-10-09 LAB — URINE CULTURE: Culture: NO GROWTH

## 2016-10-09 LAB — INFLUENZA PANEL BY PCR (TYPE A & B)
INFLAPCR: NEGATIVE
INFLBPCR: NEGATIVE

## 2016-10-09 LAB — LEGIONELLA PNEUMOPHILA SEROGP 1 UR AG: L. pneumophila Serogp 1 Ur Ag: NEGATIVE

## 2016-10-09 LAB — TROPONIN I: Troponin I: 0.06 ng/mL (ref <0.03)

## 2016-10-09 MED ORDER — IPRATROPIUM-ALBUTEROL 0.5-2.5 (3) MG/3ML IN SOLN
3.0000 mL | Freq: Four times a day (QID) | RESPIRATORY_TRACT | Status: DC | PRN
  Filled 2016-10-09: qty 3

## 2016-10-09 NOTE — Progress Notes (Signed)
PHARMACY - PHYSICIAN COMMUNICATION CRITICAL VALUE ALERT - BLOOD CULTURE IDENTIFICATION (BCID)  Results for orders placed or performed during the hospital encounter of 10/08/16  Blood Culture ID Panel (Reflexed) (Collected: 10/08/2016 12:45 PM)  Result Value Ref Range   Enterococcus species NOT DETECTED NOT DETECTED   Listeria monocytogenes NOT DETECTED NOT DETECTED   Staphylococcus species DETECTED (A) NOT DETECTED   Staphylococcus aureus NOT DETECTED NOT DETECTED   Methicillin resistance DETECTED (A) NOT DETECTED   Streptococcus species NOT DETECTED NOT DETECTED   Streptococcus agalactiae NOT DETECTED NOT DETECTED   Streptococcus pneumoniae NOT DETECTED NOT DETECTED   Streptococcus pyogenes NOT DETECTED NOT DETECTED   Acinetobacter baumannii NOT DETECTED NOT DETECTED   Enterobacteriaceae species NOT DETECTED NOT DETECTED   Enterobacter cloacae complex NOT DETECTED NOT DETECTED   Escherichia coli NOT DETECTED NOT DETECTED   Klebsiella oxytoca NOT DETECTED NOT DETECTED   Klebsiella pneumoniae NOT DETECTED NOT DETECTED   Proteus species NOT DETECTED NOT DETECTED   Serratia marcescens NOT DETECTED NOT DETECTED   Haemophilus influenzae NOT DETECTED NOT DETECTED   Neisseria meningitidis NOT DETECTED NOT DETECTED   Pseudomonas aeruginosa NOT DETECTED NOT DETECTED   Candida albicans NOT DETECTED NOT DETECTED   Candida glabrata NOT DETECTED NOT DETECTED   Candida krusei NOT DETECTED NOT DETECTED   Candida parapsilosis NOT DETECTED NOT DETECTED   Candida tropicalis NOT DETECTED NOT DETECTED    Name of physician (or Provider) Contacted: Dr. Reesa Chew  Changes to prescribed antibiotics required: none, as likely contaminant and currently covered with Levaquin and vancomycin.   Vincenza Hews, PharmD, BCPS 10/09/2016, 10:25 AM Pager: [phone]

## 2016-10-09 NOTE — Progress Notes (Signed)
[facility]  Patient Status: Active (receiving services up to time of hospitalization)  [facility] is providing the following services: RN and ST  If patient discharges after hours, please call [phone].   Amanda Hubbard 10/09/2016, 10:37 AM

## 2016-10-09 NOTE — Care Management Note (Signed)
Case Management Note  Patient Details  Name: Amanda Hubbard MRN: [id number] Date of Birth: 1927-12-08  Subjective/Objective:         Admitted with sepsis/ CAP, history  of CVA , coronary artery disease, status post CABG in 2002, hypertension, dyslipidemia. From home with husband. Pt has supportive family, 3 children( 2 sons, 1 daughter ).  Dot ( caregiver) states she provides  8 hrs of PCS to pt 6 days a week (Mon-Sat). Dot states pt has been bed bound  X 1 yr.          PCP: [facility]  Action/Plan: Plan is to d/c to home when medically stable. CM to f/u with disposition needs.  Expected Discharge Date:                  Expected Discharge Plan:  [place]  In-House Referral:     Discharge planning Services  CM Consult  Post Acute Care Choice:  Resumption of Svcs/PTA Provider Choice offered to:  Patient  DME Arranged:    DME Agency:     HH Arranged:  PT, Speech Therapy (active with [facility], RN and SLP) [place] Agency:  [facility]  Status of Service:  In process, will continue to follow  If discussed at Long Length of Stay Meetings, dates discussed:    Additional Comments:  Sharin Mons, RN 10/09/2016, 12:25 PM

## 2016-10-09 NOTE — Progress Notes (Signed)
Pt was admitted from ED per stretcher no family around pt seem confuse thinks that she is at home, self introduced to pt,fall prevention plan discussed with pt but will need reenforcement ID bracelet checked, pt refused to be swab for flu test and also refused her lab drawn this morning,  Admission history not done since pt is not able to provide rightful information, prescribed treatment started and will continue to monitor

## 2016-10-09 NOTE — Progress Notes (Signed)
CRITICAL VALUE ALERT  Critical value received: troponin 0.06  Date of notification:  1/8  Time of notification: 2238  Critical value read back: yes  Nurse who received alert:  Bing Plume  MD notified (1st page):  Keturah Barre  Time of first page:  [phone]  MD notified (2nd page):  Time of second page:  Responding MD: Keturah Barre  Time MD responded: [phone]

## 2016-10-09 NOTE — Progress Notes (Signed)
PROGRESS NOTE    Amanda Hubbard  [id number] DOB: 1927/12/04 DOA: 10/08/2016 PCP: Redge Gainer, MD   Brief Narrative:  Patient admitted for AMS and found to be in sepsis due to PNA. Currently on Abx doing better.    Assessment & Plan:   Principal Problem:   Sepsis ([place]) Active Problems:   Dyslipidemia   Essential hypertension   Coronary atherosclerosis   Type 2 diabetes mellitus, controlled ([place])   CAP (community acquired pneumonia)  Sepsis associated with CAP- improved  Continue IVF, monitor Lactic Acid, fever and WBC's count Treat underlying cause and follow up clinical progression Cont Vanc and Levaquin for now D2.   Metabolic encephalopathy - multifactorial, associated with underlying infection/sepsis and possibly enlarging recent stroke area - Improved.  Head CT is negative for acute hemorrhage or other abnormality Provide supportive care, monitor safety MRI brain - no acute process, + subacute infarct (will need one month follow up), chronic small vessel disease and multiple old infarcts.  neurochecks   CAP Continue IV antibiotic therapy Supplemental oxygen if needed Nebulizer treatments as scheduled Flu neg  Cholecystitis diagnosed in November 2017 Patient underwent abdominal CT and result is still pending She might need inpatient surgical consult if CT shows signs of cholecystitis  Pancreatic Mass Likely cystic- stable According to the old records it has been stable and GI advised against further work up.  I have discussed this with her daughter, Hassan Rowan [facility]), who aggrees with it.   Left wrist pain - resolved  -XR shows Mod-severe OA   CAD status post CABG remotely. EKG on admission is abnormal and there is no other tracing available for comparison  Patient denied active chest pain Continue cycling cardiac enzymes  Acute kidney injury superimposed on chronic kidney disease -improving with hydration.   Hypertension -stable    Hypokalemia Replace and recheck prn     DVT prophylaxis:  Heparin  Code Status: DNR, discussed with her daughter  Family Communication:  Spoke with the patients caregiver who is at bedside and the daughter, Hassan Rowan ([place]), over the phone. They both eventually want the patient to go home with maybe home PT but no SNF/rehabd or Nh.  Disposition Plan: Cont MedSurg stay, inpatient status   Consultants:   None   Procedures:   None   Antimicrobials:   Vanc D2  Levaquin D2   Subjective: According to the caregiver who is at bedside patient appears much better with hydration and IVF. This morning her appetite was improved. No other complaints.  I also spoke with her daughter over the phone who tells me she used to be on hospice but was taken off since she was doing better. Currently she lives at home with her husband and is 100% bedbound. She wants the patient to remain DNR otherwise cont medical treatment as described above.   Objective: Vitals:   10/08/16 2202 10/09/16 0546 10/09/16 1019 10/09/16 1452  BP: 127/75 110/87 131/77 131/61  Pulse: (!) 125 97  87  Resp: 18 18    Temp: 97.7 F (36.5 C) 97.6 F (36.4 C)  98.7 F (37.1 C)  TempSrc:  Oral  Oral  SpO2: 97% 96%  93%    Intake/Output Summary (Last 24 hours) at 10/09/16 1556 Last data filed at 10/09/16 1051  Gross per 24 hour  Intake          1511.67 ml  Output              500 ml  Net  1011.67 ml   There were no vitals filed for this visit.  Examination:  General exam: Appears calm and comfortable AAOx2, pleasant, frail appearing  Respiratory system: Clear to auscultation. Respiratory effort normal. Cardiovascular system: S1 & S2 heard, RRR. No JVD, + systolic murmurs,  No rubs, gallops or clicks. No pedal edema. Gastrointestinal system: Abdomen is nondistended, soft and nontender. No organomegaly or masses felt. Normal bowel sounds heard. Central nervous system: Alert and oriented. No focal  neurological deficits. Extremities: Symmetric 5 x 5 power. Skin: No rashes, lesions or ulcers Psychiatry: Judgement and insight appear normal. Mood & affect appropriate.     Data Reviewed:   CBC:  Recent Labs Lab 10/08/16 1240 10/08/16 1300 10/08/16 2155  WBC 23.9*  --  16.2*  NEUTROABS 21.8*  --  13.7*  HGB 13.0 13.3 11.4*  HCT 39.2 39.0 34.9*  MCV 99.2  --  98.9  PLT 266  --  0000000   Basic Metabolic Panel:  Recent Labs Lab 10/08/16 1240 10/08/16 1300 10/08/16 2155  NA 139 140 140  K 3.4* 3.4* 3.6  CL 103 101 107  CO2 23  --  22  GLUCOSE 134* 139* 126*  BUN 21* 26* 20  CREATININE 1.14* 1.20* 1.00  CALCIUM 9.4  --  8.4*   GFR: CrCl cannot be calculated (Unknown ideal weight.). Liver Function Tests:  Recent Labs Lab 10/08/16 1240 10/08/16 2155  AST 16 16  ALT 10* 9*  ALKPHOS 52 46  BILITOT 2.1* 1.9*  PROT 6.2* 5.7*  ALBUMIN 3.3* 2.8*   No results for input(s): LIPASE, AMYLASE in the last 168 hours. No results for input(s): AMMONIA in the last 168 hours. Coagulation Profile:  Recent Labs Lab 10/08/16 1240 10/08/16 2155  INR 1.12 1.24   Cardiac Enzymes:  Recent Labs Lab 10/08/16 2147 10/09/16 0647  TROPONINI 0.06* 0.06*   BNP (last 3 results) No results for input(s): PROBNP in the last 8760 hours. HbA1C: No results for input(s): HGBA1C in the last 72 hours. CBG:  Recent Labs Lab 10/08/16 1833  GLUCAP 108*   Lipid Profile: No results for input(s): CHOL, HDL, LDLCALC, TRIG, CHOLHDL, LDLDIRECT in the last 72 hours. Thyroid Function Tests: No results for input(s): TSH, T4TOTAL, FREET4, T3FREE, THYROIDAB in the last 72 hours. Anemia Panel: No results for input(s): VITAMINB12, FOLATE, FERRITIN, TIBC, IRON, RETICCTPCT in the last 72 hours. Sepsis Labs:  Recent Labs Lab 10/08/16 1300 10/08/16 2147 10/08/16 2155  PROCALCITON  --   --  0.19  LATICACIDVEN 2.08* 2.1*  --     Recent Results (from the past 240 hour(s))  Blood Culture  (routine x 2)     Status: None (Preliminary result)   Collection Time: 10/08/16 12:44 PM  Result Value Ref Range Status   Specimen Description BLOOD LEFT FOREARM  Final   Special Requests BOTTLES DRAWN AEROBIC AND ANAEROBIC 5CC  Final   Culture NO GROWTH 1 DAY  Final   Report Status PENDING  Incomplete  Blood Culture (routine x 2)     Status: None (Preliminary result)   Collection Time: 10/08/16 12:45 PM  Result Value Ref Range Status   Specimen Description BLOOD RIGHT FOREARM  Final   Special Requests BOTTLES DRAWN AEROBIC AND ANAEROBIC 5CC  Final   Culture  Setup Time   Final    GRAM POSITIVE COCCI IN CLUSTERS IN BOTH AEROBIC AND ANAEROBIC BOTTLES Organism ID to follow CRITICAL RESULT CALLED TO, READ BACK BY AND VERIFIED WITH: Doretha Imus 10:20  10/09/16 ([email or web address])    Culture GRAM POSITIVE COCCI  Final   Report Status PENDING  Incomplete  Blood Culture ID Panel (Reflexed)     Status: Abnormal   Collection Time: 10/08/16 12:45 PM  Result Value Ref Range Status   Enterococcus species NOT DETECTED NOT DETECTED Final   Listeria monocytogenes NOT DETECTED NOT DETECTED Final   Staphylococcus species DETECTED (A) NOT DETECTED Final    Comment: CRITICAL RESULT CALLED TO, READ BACK BY AND VERIFIED WITH: AElta [place].D. 10:20 10/09/16 ([email or web address])    Staphylococcus aureus NOT DETECTED NOT DETECTED Final   Methicillin resistance DETECTED (A) NOT DETECTED Final    Comment: CRITICAL RESULT CALLED TO, READ BACK BY AND VERIFIED WITH: AElta [place].D. 10:20 10/09/16 ([email or web address])    Streptococcus species NOT DETECTED NOT DETECTED Final   Streptococcus agalactiae NOT DETECTED NOT DETECTED Final   Streptococcus pneumoniae NOT DETECTED NOT DETECTED Final   Streptococcus pyogenes NOT DETECTED NOT DETECTED Final   Acinetobacter baumannii NOT DETECTED NOT DETECTED Final   Enterobacteriaceae species NOT DETECTED NOT DETECTED Final   Enterobacter cloacae complex NOT DETECTED NOT DETECTED Final    Escherichia coli NOT DETECTED NOT DETECTED Final   Klebsiella oxytoca NOT DETECTED NOT DETECTED Final   Klebsiella pneumoniae NOT DETECTED NOT DETECTED Final   Proteus species NOT DETECTED NOT DETECTED Final   Serratia marcescens NOT DETECTED NOT DETECTED Final   Haemophilus influenzae NOT DETECTED NOT DETECTED Final   Neisseria meningitidis NOT DETECTED NOT DETECTED Final   Pseudomonas aeruginosa NOT DETECTED NOT DETECTED Final   Candida albicans NOT DETECTED NOT DETECTED Final   Candida glabrata NOT DETECTED NOT DETECTED Final   Candida krusei NOT DETECTED NOT DETECTED Final   Candida parapsilosis NOT DETECTED NOT DETECTED Final   Candida tropicalis NOT DETECTED NOT DETECTED Final  Urine culture     Status: None   Collection Time: 10/08/16  4:39 PM  Result Value Ref Range Status   Specimen Description URINE, CATHETERIZED  Final   Special Requests NONE  Final   Culture NO GROWTH  Final   Report Status 10/09/2016 FINAL  Final         Radiology Studies: Dg Chest 1 View  Result Date: 10/08/2016 CLINICAL DATA:  [place] while at home today.  LEFT wrist pain. EXAM: CHEST 1 VIEW COMPARISON:  Chest radiograph October 08, 2016 at 1031 hours FINDINGS: Cardiac silhouette is mildly enlarged unchanged. Status post median sternotomy for CABG. Mildly calcified aortic knob. Similar strandy densities projecting LEFT lung base without pleural effusion or focal consolidation. No pneumothorax. Osteopenia. Soft tissue planes are nonsuspicious; calcifications in neck are likely vascular IMPRESSION: Similar LEFT lung base atelectasis/ scarring. Mild cardiomegaly. Electronically Signed   By: Elon Alas M.D.   On: 10/08/2016 17:13   Dg Chest 2 View  Result Date: 10/08/2016 CLINICAL DATA:  Low grade temperature, possible infection EXAM: CHEST  2 VIEW COMPARISON:  07/09/2016 FINDINGS: Cardiomediastinal silhouette is stable. There is streaky left base retrocardiac atelectasis or early infiltrate. No pulmonary  edema. Osteopenia and degenerative changes thoracic spine. IMPRESSION: Streaky left base retrocardiac atelectasis or early infiltrate. No pulmonary edema. Electronically Signed   By: Lahoma Crocker M.D.   On: 10/08/2016 11:46   Dg Wrist 2 Views Left  Result Date: 10/08/2016 CLINICAL DATA:  [place] while at home today.  LEFT wrist pain. EXAM: LEFT WRIST - 2 VIEW COMPARISON:  None. FINDINGS: No acute fracture deformity or dislocation. Osteopenia without destructive bony lesions. Moderate to severe  first carpometacarpal joint space narrowing, periarticular sclerosis and marginal spurring compatible with osteoarthrosis. Faint intra-articular calcifications compatible with CPPD. Dorsal wrist soft tissue swelling without subcutaneous gas or radiopaque foreign bodies. IMPRESSION: Soft tissue swelling without acute fracture deformity or dislocation. CPPD. Moderate to severe first carpometacarpal osteoarthrosis. Electronically Signed   By: Elon Alas M.D.   On: 10/08/2016 17:15   Ct Head Wo Contrast  Result Date: 10/08/2016 CLINICAL DATA:  Left facial droop. Left-sided weakness beginning last night. EXAM: CT HEAD WITHOUT CONTRAST TECHNIQUE: Contiguous axial images were obtained from the base of the skull through the vertex without intravenous contrast. COMPARISON:  07/09/2016. FINDINGS: Brain: There is atrophy and chronic small vessel disease changes. No acute intracranial abnormality. Specifically, no hemorrhage, hydrocephalus, mass lesion, acute infarction, or significant intracranial injury. Vascular: No hyperdense vessel or unexpected calcification. Skull: No acute calvarial abnormality. Sinuses/Orbits: Visualized paranasal sinuses and mastoids clear. Orbital soft tissues unremarkable. Other: None IMPRESSION: No acute intracranial abnormality. Atrophy, chronic microvascular disease. Electronically Signed   By: Rolm Baptise M.D.   On: 10/08/2016 11:35   Mr Brain Wo Contrast  Result Date: 10/08/2016 CLINICAL DATA:   Encephalopathy, weakness and altered mental status. History of hypertension, hyperlipidemia. EXAM: MRI HEAD WITHOUT CONTRAST TECHNIQUE: Multiplanar, multiecho pulse sequences of the brain and surrounding structures were obtained without intravenous contrast. COMPARISON:  CT HEAD October 08, 2016 at 1118 hours and MRI of the head July 10, 2016 FINDINGS: Multiple sequences are moderately motion degraded. BRAIN: Faint reduced diffusion LEFT temporal lobe associated with confluent new FLAIR white matter T2 hyperintensities, no focal atrophy. Normalized ADC values. No susceptibility artifact to suggest hemorrhage. Old bilateral small cerebellar infarcts. Old RIGHT posterior insula infarct. Patchy to confluent supratentorial white matter FLAIR T2 hyperintensities ventricles and sulci are overall normal for patient's age. No midline shift, mass effect or abnormal extra-axial fluid collections. VASCULAR: Normal major intracranial vascular flow voids present at skull base. SKULL AND UPPER CERVICAL SPINE: No abnormal sellar expansion. No suspicious calvarial bone marrow signal. Craniocervical junction maintained. Subcentimeter probable pars intermedius cyst. SINUSES/ORBITS: RIGHT mastoid effusion. Trace paranasal sinus mucosal thickening. Status post LEFT ocular lens implant. The included ocular globes and orbital contents are non-suspicious. OTHER: None. IMPRESSION: No acute intracranial process on this motion degraded examination. LEFT temporal lobe subacute infarct, less likely infiltrative tumor. Recommend 1 month follow-up. Stable moderate to severe chronic small vessel ischemic disease, old small RIGHT MCA territory infarct and old cerebellar infarcts. Electronically Signed   By: Elon Alas M.D.   On: 10/08/2016 19:59   Ct Abdomen Pelvis W Contrast  Result Date: 10/08/2016 CLINICAL DATA:  Gallbladder disease. EXAM: CT ABDOMEN AND PELVIS WITH CONTRAST TECHNIQUE: Multidetector CT imaging of the abdomen and  pelvis was performed using the standard protocol following bolus administration of intravenous contrast. CONTRAST:  1 ISOVUE-300 IOPAMIDOL (ISOVUE-300) INJECTION 61% COMPARISON:  CT scan of January 08, 2015 and August 20, 2012. FINDINGS: Lower chest: No acute abnormality. Hepatobiliary: Distended gallbladder is noted with cholelithiasis. No focal parenchymal abnormality is noted in the liver. Mild intrahepatic and extrahepatic biliary dilatation is noted which was present on prior exam. Pancreas: 5.4 x 4.5 cm multi-cystic mass arises from the pancreatic head. Spleen: Normal in size without focal abnormality. Adrenals/Urinary Tract: Status post right nephrectomy. Adrenal glands appear normal. Stable left renal cyst is noted. No hydronephrosis or renal obstruction is noted. Urinary bladder appears normal. Stomach/Bowel: There is no evidence of bowel obstruction. Vascular/Lymphatic: Aortic atherosclerosis. No enlarged abdominal or pelvic lymph  nodes. Reproductive: Status post hysterectomy. No adnexal masses. Other: No abdominal wall hernia or abnormality. No abdominopelvic ascites. Musculoskeletal: No acute or significant osseous findings. IMPRESSION: Distended gallbladder were cholelithiasis, but no evidence of cholecystitis. Mild intrahepatic and extrahepatic biliary dilatation is noted which was present on prior exam. Correlation with liver function tests is recommended to rule out biliary obstruction. Status post right nephrectomy. Aortic atherosclerosis. 5.4 x 4.5 cm multi-cystic mass seen in pancreatic head. This is concerning for either serous cystadenoma or possibly mucinous cystic neoplasm. Further evaluation with endoscopic ultrasound and cyst aspiration should be considered, if not already performed. Electronically Signed   By: Marijo Conception, M.D.   On: 10/08/2016 15:24        Scheduled Meds: . amLODipine  5 mg Oral Daily  . aspirin EC  81 mg Oral Daily  . atorvastatin  20 mg Oral QHS  . fentaNYL   50 mcg Transdermal Q72H  . gabapentin  300 mg Oral TID  . heparin  5,000 Units Subcutaneous Q8H  . [START ON 10/10/2016] levofloxacin (LEVAQUIN) IV  750 mg Intravenous Q48H  . senna-docusate  2 tablet Oral Daily  . sodium chloride flush  3 mL Intravenous Q12H  . sulfacetamide  1 drop Right Eye Q4H  . temazepam  30 mg Oral QHS  . vancomycin  500 mg Intravenous Q24H  . vancomycin  1,000 mg Intravenous Once   Continuous Infusions: . sodium chloride 100 mL/hr at 10/09/16 1454     LOS: 1 day    Time spent: 35 mins     Ankit Arsenio Loader, MD [facility] Pager [phone]   If 7PM-7AM, please contact night-coverage [email or web address] Password [email or web address] 10/09/2016, 3:56 PM

## 2016-10-10 DIAGNOSIS — R451 Restlessness and agitation: Secondary | ICD-10-CM

## 2016-10-10 DIAGNOSIS — R000 Tachycardia, unspecified: Secondary | ICD-10-CM

## 2016-10-10 DIAGNOSIS — E119 Type 2 diabetes mellitus without complications: Secondary | ICD-10-CM

## 2016-10-10 LAB — CBC
HCT: 33.7 % — ABNORMAL LOW (ref 36.0–46.0)
Hemoglobin: 10.9 g/dL — ABNORMAL LOW (ref 12.0–15.0)
MCH: 31.9 pg (ref 26.0–34.0)
MCHC: 32.3 g/dL (ref 30.0–36.0)
MCV: 98.5 fL (ref 78.0–100.0)
PLATELETS: 278 10*3/uL (ref 150–400)
RBC: 3.42 MIL/uL — ABNORMAL LOW (ref 3.87–5.11)
RDW: 14.4 % (ref 11.5–15.5)
WBC: 19.9 10*3/uL — ABNORMAL HIGH (ref 4.0–10.5)

## 2016-10-10 LAB — BASIC METABOLIC PANEL
ANION GAP: 7 (ref 5–15)
BUN: 12 mg/dL (ref 6–20)
CALCIUM: 8.3 mg/dL — AB (ref 8.9–10.3)
CO2: 21 mmol/L — ABNORMAL LOW (ref 22–32)
Chloride: 117 mmol/L — ABNORMAL HIGH (ref 101–111)
Creatinine, Ser: 0.8 mg/dL (ref 0.44–1.00)
GLUCOSE: 148 mg/dL — AB (ref 65–99)
POTASSIUM: 3.5 mmol/L (ref 3.5–5.1)
Sodium: 145 mmol/L (ref 135–145)

## 2016-10-10 LAB — MAGNESIUM: Magnesium: 1.8 mg/dL (ref 1.7–2.4)

## 2016-10-10 MED ORDER — LORAZEPAM 1 MG PO TABS
1.0000 mg | ORAL_TABLET | Freq: Four times a day (QID) | ORAL | Status: DC | PRN
  Administered 2016-10-10 – 2016-10-20 (×12): 1 mg via ORAL
  Filled 2016-10-10 (×14): qty 1

## 2016-10-10 MED ORDER — DEXTROSE 5 % IV SOLN
1.0000 g | Freq: Three times a day (TID) | INTRAVENOUS | Status: DC
  Administered 2016-10-10 – 2016-10-11 (×3): 1 g via INTRAVENOUS
  Filled 2016-10-10 (×5): qty 1

## 2016-10-10 MED ORDER — METOPROLOL TARTRATE 5 MG/5ML IV SOLN
5.0000 mg | INTRAVENOUS | Status: DC | PRN
  Administered 2016-10-10 – 2016-10-11 (×3): 5 mg via INTRAVENOUS
  Filled 2016-10-10 (×3): qty 5

## 2016-10-10 MED ORDER — METOPROLOL TARTRATE 5 MG/5ML IV SOLN
5.0000 mg | Freq: Once | INTRAVENOUS | Status: AC
  Administered 2016-10-10: 5 mg via INTRAVENOUS
  Filled 2016-10-10: qty 5

## 2016-10-10 NOTE — Progress Notes (Signed)
MD aware of EKG results and reported no change from prior testing. Stated to continue with lopressor order. Lopressor was administered. Pt current HR at 84. Will continue to monitor.

## 2016-10-10 NOTE — Progress Notes (Signed)
Pharmacy Antibiotic Note  Amanda Hubbard is a 83 y.o. female admitted on 10/08/2016 with pneumonia.  Continues on Vancomycin and Levaquin -- Day # 3  Blood cultures negative (contaminant), afebrile Scr stable  Plan: Vancomycin 500mg  IV every 24 hours.  Goal trough 15-20 mcg/mL.  Consider stopping vancomycin?   Levaquin 750mg  IV q48h Monitor culture data, renal function and clinical course VT at SS prn  Weight: 125 lb 7.1 oz (56.9 kg)  Temp (24hrs), Avg:98.4 F (36.9 C), Min:98.1 F (36.7 C), Max:98.7 F (37.1 C)   Recent Labs Lab 10/08/16 1240 10/08/16 1300 10/08/16 2147 10/08/16 2155  WBC 23.9*  --   --  16.2*  CREATININE 1.14* 1.20*  --  1.00  LATICACIDVEN  --  2.08* 2.1*  --     Estimated Creatinine Clearance: 33.4 mL/min (by C-G formula based on SCr of 1 mg/dL).    Allergies  Allergen Reactions  . Bee Venom Anaphylaxis  . Penicillins Other (See Comments)   Thank you Anette Guarneri, PharmD [phone]  10/10/2016 11:25 AM

## 2016-10-10 NOTE — Progress Notes (Addendum)
PROGRESS NOTE    Amanda Hubbard  [id number] DOB: Sep 24, 1928 DOA: 10/08/2016 PCP: Redge Gainer, MD   Brief Narrative:  Patient admitted for AMS and found to be in sepsis due to PNA. Currently on Abx doing better. She did have episode of tachycardiac this morning but I was told that she was moving around a lot the time. Since then it has slowed down.    Assessment & Plan:   Principal Problem:   Sepsis ([place]) Active Problems:   Dyslipidemia   Essential hypertension   Coronary atherosclerosis   Type 2 diabetes mellitus, controlled ([place])   CAP (community acquired pneumonia)  Sepsis associated with CAP- improved  Continue IVF, lactate improved  WBC trending up Treat underlying cause and follow up clinical progression Cont Vanc D3 and Will change Levaquin to Aztreonam (PCN allergy) at this time as her WBC continues to rise. Clinically she is doing slightly better. Can deescalate over a next day or two depending on her status, labs and cultures.  One of the blood cultures showed Stap (unknown species)- already on vanc. Will await speciation.   Metabolic encephalopathy - multifactorial, associated with underlying infection/sepsis and possibly enlarging recent stroke area - Improved.  Head CT is negative for acute hemorrhage or other abnormality Provide supportive care, monitor safety MRI brain - no acute process, + subacute infarct (will need one month follow up), chronic small vessel disease and multiple old infarcts.  neurochecks   CAP Continue IV antibiotic therapy Supplemental oxygen if needed Nebulizer treatments as scheduled Flu neg  Sinus tachycardia -from moving around in the bed too much/agitation. She is very deconditioned. Will monitor fnow. If continues to stay high, will evaluated her for PE or any other etiology -ordered Lorazepam for her agitation, she does take it at home.    Pancreatic Mass Likely cystic- stable According to the old records it has been  stable and GI advised against further work up.  I have discussed this with her daughter, Hassan Rowan [facility]), who aggrees with it.   Left wrist pain - resolved  -XR shows Mod-severe OA  CAD status post CABG remotely. Stable at this time, cont current medications   Acute kidney injury superimposed on chronic kidney disease -resolved   Hypertension -stable   Hypokalemia Replace and recheck prn     DVT prophylaxis:  Heparin  Code Status: DNR, discussed with her daughter  Family Communication:  Spoke with the patients caregiver who is at bedside and answered all the questions.  Disposition Plan: Cont MedSurg stay, inpatient status   Consultants:   None   Procedures:   None   Antimicrobials:   Vanc D3  Aztreonam D1  Levaquin D2 - stopped 1/10   Subjective: According to the caregiver patient is little better again today. She did have episode of agitation earlier today and her HR had gone up. Otherwise no complaints at his time. She still has some chronic abdominal pain likely from her pancreatic cyst.   Objective: Vitals:   10/09/16 2302 10/10/16 0526 10/10/16 0913 10/10/16 1325  BP: (!) 158/81 (!) 133/92 (!) 162/73 (!) 150/70  Pulse: (!) 104 (!) 121 (!) 107 (!) 122  Resp: 18 18  20   Temp: 98.3 F (36.8 C) 98.1 F (36.7 C)  99 F (37.2 C)  TempSrc: Oral Oral  Oral  SpO2: 98% 97%  92%  Weight:  56.9 kg (125 lb 7.1 oz)     No intake or output data in the 24 hours ending 10/10/16  [place]   10/10/16 0526  Weight: 56.9 kg (125 lb 7.1 oz)    Examination:  General exam: Appears calm and comfortable AAOx2, pleasant, frail appearing  Respiratory system: Clear to auscultation. Respiratory effort normal. Cardiovascular system: S1 & S2 heard, RRR. No JVD, + systolic murmurs,  No rubs, gallops or clicks. No pedal edema. Gastrointestinal system: Abdomen is nondistended, soft and nontender. No organomegaly or masses felt. Normal bowel sounds heard. Central  nervous system: Alert and oriented. No focal neurological deficits. Extremities: Symmetric 5 x 5 power. Skin: No rashes, lesions or ulcers Psychiatry: Judgement and insight appear normal. Mood & affect appropriate.     Data Reviewed:   CBC:  Recent Labs Lab 10/08/16 1240 10/08/16 1300 10/08/16 2155 10/10/16 1130  WBC 23.9*  --  16.2* 19.9*  NEUTROABS 21.8*  --  13.7*  --   HGB 13.0 13.3 11.4* 10.9*  HCT 39.2 39.0 34.9* 33.7*  MCV 99.2  --  98.9 98.5  PLT 266  --  246 0000000   Basic Metabolic Panel:  Recent Labs Lab 10/08/16 1240 10/08/16 1300 10/08/16 2155 10/10/16 1130  NA 139 140 140 145  K 3.4* 3.4* 3.6 3.5  CL 103 101 107 117*  CO2 23  --  22 21*  GLUCOSE 134* 139* 126* 148*  BUN 21* 26* 20 12  CREATININE 1.14* 1.20* 1.00 0.80  CALCIUM 9.4  --  8.4* 8.3*  MG  --   --   --  1.8   GFR: Estimated Creatinine Clearance: 41.8 mL/min (by C-G formula based on SCr of 0.8 mg/dL). Liver Function Tests:  Recent Labs Lab 10/08/16 1240 10/08/16 2155  AST 16 16  ALT 10* 9*  ALKPHOS 52 46  BILITOT 2.1* 1.9*  PROT 6.2* 5.7*  ALBUMIN 3.3* 2.8*   No results for input(s): LIPASE, AMYLASE in the last 168 hours. No results for input(s): AMMONIA in the last 168 hours. Coagulation Profile:  Recent Labs Lab 10/08/16 1240 10/08/16 2155  INR 1.12 1.24   Cardiac Enzymes:  Recent Labs Lab 10/08/16 2147 10/09/16 0647  TROPONINI 0.06* 0.06*   BNP (last 3 results) No results for input(s): PROBNP in the last 8760 hours. HbA1C: No results for input(s): HGBA1C in the last 72 hours. CBG:  Recent Labs Lab 10/08/16 1833  GLUCAP 108*   Lipid Profile: No results for input(s): CHOL, HDL, LDLCALC, TRIG, CHOLHDL, LDLDIRECT in the last 72 hours. Thyroid Function Tests: No results for input(s): TSH, T4TOTAL, FREET4, T3FREE, THYROIDAB in the last 72 hours. Anemia Panel: No results for input(s): VITAMINB12, FOLATE, FERRITIN, TIBC, IRON, RETICCTPCT in the last 72  hours. Sepsis Labs:  Recent Labs Lab 10/08/16 1300 10/08/16 2147 10/08/16 2155  PROCALCITON  --   --  0.19  LATICACIDVEN 2.08* 2.1*  --     Recent Results (from the past 240 hour(s))  Blood Culture (routine x 2)     Status: None (Preliminary result)   Collection Time: 10/08/16 12:44 PM  Result Value Ref Range Status   Specimen Description BLOOD LEFT FOREARM  Final   Special Requests BOTTLES DRAWN AEROBIC AND ANAEROBIC 5CC  Final   Culture NO GROWTH 2 DAYS  Final   Report Status PENDING  Incomplete  Blood Culture (routine x 2)     Status: Abnormal (Preliminary result)   Collection Time: 10/08/16 12:45 PM  Result Value Ref Range Status   Specimen Description BLOOD RIGHT FOREARM  Final   Special Requests BOTTLES DRAWN AEROBIC AND  ANAEROBIC 5CC  Final   Culture  Setup Time   Final    GRAM POSITIVE COCCI IN CLUSTERS Organism ID to follow CRITICAL RESULT CALLED TO, READ BACK BY AND VERIFIED WITH: AEdwina Barth 10:20 10/09/16 ([email or web address])    Culture (A)  Final    STAPHYLOCOCCUS SPECIES (COAGULASE NEGATIVE) THE SIGNIFICANCE OF ISOLATING THIS ORGANISM FROM A SINGLE SET OF BLOOD CULTURES WHEN MULTIPLE SETS ARE DRAWN IS UNCERTAIN. PLEASE NOTIFY THE MICROBIOLOGY DEPARTMENT WITHIN ONE WEEK IF SPECIATION AND SENSITIVITIES ARE REQUIRED.    Report Status PENDING  Incomplete  Blood Culture ID Panel (Reflexed)     Status: Abnormal   Collection Time: 10/08/16 12:45 PM  Result Value Ref Range Status   Enterococcus species NOT DETECTED NOT DETECTED Final   Listeria monocytogenes NOT DETECTED NOT DETECTED Final   Staphylococcus species DETECTED (A) NOT DETECTED Final    Comment: CRITICAL RESULT CALLED TO, READ BACK BY AND VERIFIED WITH: AElta [place].D. 10:20 10/09/16 ([email or web address])    Staphylococcus aureus NOT DETECTED NOT DETECTED Final   Methicillin resistance DETECTED (A) NOT DETECTED Final    Comment: CRITICAL RESULT CALLED TO, READ BACK BY AND VERIFIED WITH: AElta [place].D. 10:20 10/09/16  ([email or web address])    Streptococcus species NOT DETECTED NOT DETECTED Final   Streptococcus agalactiae NOT DETECTED NOT DETECTED Final   Streptococcus pneumoniae NOT DETECTED NOT DETECTED Final   Streptococcus pyogenes NOT DETECTED NOT DETECTED Final   Acinetobacter baumannii NOT DETECTED NOT DETECTED Final   Enterobacteriaceae species NOT DETECTED NOT DETECTED Final   Enterobacter cloacae complex NOT DETECTED NOT DETECTED Final   Escherichia coli NOT DETECTED NOT DETECTED Final   Klebsiella oxytoca NOT DETECTED NOT DETECTED Final   Klebsiella pneumoniae NOT DETECTED NOT DETECTED Final   Proteus species NOT DETECTED NOT DETECTED Final   Serratia marcescens NOT DETECTED NOT DETECTED Final   Haemophilus influenzae NOT DETECTED NOT DETECTED Final   Neisseria meningitidis NOT DETECTED NOT DETECTED Final   Pseudomonas aeruginosa NOT DETECTED NOT DETECTED Final   Candida albicans NOT DETECTED NOT DETECTED Final   Candida glabrata NOT DETECTED NOT DETECTED Final   Candida krusei NOT DETECTED NOT DETECTED Final   Candida parapsilosis NOT DETECTED NOT DETECTED Final   Candida tropicalis NOT DETECTED NOT DETECTED Final  Urine culture     Status: None   Collection Time: 10/08/16  4:39 PM  Result Value Ref Range Status   Specimen Description URINE, CATHETERIZED  Final   Special Requests NONE  Final   Culture NO GROWTH  Final   Report Status 10/09/2016 FINAL  Final         Radiology Studies: Dg Chest 1 View  Result Date: 10/08/2016 CLINICAL DATA:  [place] while at home today.  LEFT wrist pain. EXAM: CHEST 1 VIEW COMPARISON:  Chest radiograph October 08, 2016 at 1031 hours FINDINGS: Cardiac silhouette is mildly enlarged unchanged. Status post median sternotomy for CABG. Mildly calcified aortic knob. Similar strandy densities projecting LEFT lung base without pleural effusion or focal consolidation. No pneumothorax. Osteopenia. Soft tissue planes are nonsuspicious; calcifications in neck are likely vascular  IMPRESSION: Similar LEFT lung base atelectasis/ scarring. Mild cardiomegaly. Electronically Signed   By: Elon Alas M.D.   On: 10/08/2016 17:13   Dg Wrist 2 Views Left  Result Date: 10/08/2016 CLINICAL DATA:  [place] while at home today.  LEFT wrist pain. EXAM: LEFT WRIST - 2 VIEW COMPARISON:  None. FINDINGS: No acute fracture deformity or dislocation. Osteopenia without destructive bony lesions. Moderate  to severe first carpometacarpal joint space narrowing, periarticular sclerosis and marginal spurring compatible with osteoarthrosis. Faint intra-articular calcifications compatible with CPPD. Dorsal wrist soft tissue swelling without subcutaneous gas or radiopaque foreign bodies. IMPRESSION: Soft tissue swelling without acute fracture deformity or dislocation. CPPD. Moderate to severe first carpometacarpal osteoarthrosis. Electronically Signed   By: Elon Alas M.D.   On: 10/08/2016 17:15   Mr Brain Wo Contrast  Result Date: 10/08/2016 CLINICAL DATA:  Encephalopathy, weakness and altered mental status. History of hypertension, hyperlipidemia. EXAM: MRI HEAD WITHOUT CONTRAST TECHNIQUE: Multiplanar, multiecho pulse sequences of the brain and surrounding structures were obtained without intravenous contrast. COMPARISON:  CT HEAD October 08, 2016 at 1118 hours and MRI of the head July 10, 2016 FINDINGS: Multiple sequences are moderately motion degraded. BRAIN: Faint reduced diffusion LEFT temporal lobe associated with confluent new FLAIR white matter T2 hyperintensities, no focal atrophy. Normalized ADC values. No susceptibility artifact to suggest hemorrhage. Old bilateral small cerebellar infarcts. Old RIGHT posterior insula infarct. Patchy to confluent supratentorial white matter FLAIR T2 hyperintensities ventricles and sulci are overall normal for patient's age. No midline shift, mass effect or abnormal extra-axial fluid collections. VASCULAR: Normal major intracranial vascular flow voids present  at skull base. SKULL AND UPPER CERVICAL SPINE: No abnormal sellar expansion. No suspicious calvarial bone marrow signal. Craniocervical junction maintained. Subcentimeter probable pars intermedius cyst. SINUSES/ORBITS: RIGHT mastoid effusion. Trace paranasal sinus mucosal thickening. Status post LEFT ocular lens implant. The included ocular globes and orbital contents are non-suspicious. OTHER: None. IMPRESSION: No acute intracranial process on this motion degraded examination. LEFT temporal lobe subacute infarct, less likely infiltrative tumor. Recommend 1 month follow-up. Stable moderate to severe chronic small vessel ischemic disease, old small RIGHT MCA territory infarct and old cerebellar infarcts. Electronically Signed   By: Elon Alas M.D.   On: 10/08/2016 19:59   Ct Abdomen Pelvis W Contrast  Result Date: 10/08/2016 CLINICAL DATA:  Gallbladder disease. EXAM: CT ABDOMEN AND PELVIS WITH CONTRAST TECHNIQUE: Multidetector CT imaging of the abdomen and pelvis was performed using the standard protocol following bolus administration of intravenous contrast. CONTRAST:  1 ISOVUE-300 IOPAMIDOL (ISOVUE-300) INJECTION 61% COMPARISON:  CT scan of January 08, 2015 and August 20, 2012. FINDINGS: Lower chest: No acute abnormality. Hepatobiliary: Distended gallbladder is noted with cholelithiasis. No focal parenchymal abnormality is noted in the liver. Mild intrahepatic and extrahepatic biliary dilatation is noted which was present on prior exam. Pancreas: 5.4 x 4.5 cm multi-cystic mass arises from the pancreatic head. Spleen: Normal in size without focal abnormality. Adrenals/Urinary Tract: Status post right nephrectomy. Adrenal glands appear normal. Stable left renal cyst is noted. No hydronephrosis or renal obstruction is noted. Urinary bladder appears normal. Stomach/Bowel: There is no evidence of bowel obstruction. Vascular/Lymphatic: Aortic atherosclerosis. No enlarged abdominal or pelvic lymph nodes.  Reproductive: Status post hysterectomy. No adnexal masses. Other: No abdominal wall hernia or abnormality. No abdominopelvic ascites. Musculoskeletal: No acute or significant osseous findings. IMPRESSION: Distended gallbladder were cholelithiasis, but no evidence of cholecystitis. Mild intrahepatic and extrahepatic biliary dilatation is noted which was present on prior exam. Correlation with liver function tests is recommended to rule out biliary obstruction. Status post right nephrectomy. Aortic atherosclerosis. 5.4 x 4.5 cm multi-cystic mass seen in pancreatic head. This is concerning for either serous cystadenoma or possibly mucinous cystic neoplasm. Further evaluation with endoscopic ultrasound and cyst aspiration should be considered, if not already performed. Electronically Signed   By: Marijo Conception, M.D.   On: 10/08/2016 15:24  Scheduled Meds: . amLODipine  5 mg Oral Daily  . aspirin EC  81 mg Oral Daily  . atorvastatin  20 mg Oral QHS  . fentaNYL  50 mcg Transdermal Q72H  . gabapentin  300 mg Oral TID  . heparin  5,000 Units Subcutaneous Q8H  . levofloxacin (LEVAQUIN) IV  750 mg Intravenous Q48H  . senna-docusate  2 tablet Oral Daily  . sodium chloride flush  3 mL Intravenous Q12H  . sulfacetamide  1 drop Right Eye Q4H  . temazepam  30 mg Oral QHS  . vancomycin  500 mg Intravenous Q24H  . vancomycin  1,000 mg Intravenous Once   Continuous Infusions: . sodium chloride 100 mL/hr at 10/10/16 1338     LOS: 2 days    Time spent: 35 mins     Ankit Arsenio Loader, MD [facility] Pager [phone]   If 7PM-7AM, please contact night-coverage [email or web address] Password [email or web address] 10/10/2016, 1:55 PM

## 2016-10-10 NOTE — Progress Notes (Signed)
Pt HR sustaining from low 100's-150's. MD notified and gave verbal orders for Lopressor 5mg  IV q3hrs prn.

## 2016-10-10 NOTE — Progress Notes (Signed)
Central tele notified nurse that pt was tachy at 150's. Monitor showing HR 120's-150's. Pt in no distress and had no c/o pain. MD notified. Verbal orders for EKG and 5mg  lopressor iv if HR does not decrease within 63mins.

## 2016-10-11 ENCOUNTER — Inpatient Hospital Stay (HOSPITAL_COMMUNITY): Payer: PPO

## 2016-10-11 ENCOUNTER — Encounter (HOSPITAL_COMMUNITY): Payer: Self-pay | Admitting: Radiology

## 2016-10-11 LAB — BASIC METABOLIC PANEL
Anion gap: 7 (ref 5–15)
BUN: 8 mg/dL (ref 6–20)
CALCIUM: 8.1 mg/dL — AB (ref 8.9–10.3)
CO2: 21 mmol/L — ABNORMAL LOW (ref 22–32)
CREATININE: 0.76 mg/dL (ref 0.44–1.00)
Chloride: 117 mmol/L — ABNORMAL HIGH (ref 101–111)
GFR calc Af Amer: >60 mL/min (ref >60)
GFR calc non Af Amer: >60 mL/min (ref >60)
Glucose, Bld: 106 mg/dL — ABNORMAL HIGH (ref 65–99)
Potassium: 3.5 mmol/L (ref 3.5–5.1)
SODIUM: 145 mmol/L (ref 135–145)

## 2016-10-11 LAB — CULTURE, BLOOD (ROUTINE X 2)

## 2016-10-11 LAB — CBC
HCT: 30.5 % — ABNORMAL LOW (ref 36.0–46.0)
Hemoglobin: 9.9 g/dL — ABNORMAL LOW (ref 12.0–15.0)
MCH: 32 pg (ref 26.0–34.0)
MCHC: 32.5 g/dL (ref 30.0–36.0)
MCV: 98.7 fL (ref 78.0–100.0)
PLATELETS: 278 10*3/uL (ref 150–400)
RBC: 3.09 MIL/uL — ABNORMAL LOW (ref 3.87–5.11)
RDW: 14.5 % (ref 11.5–15.5)
WBC: 12 10*3/uL — AB (ref 4.0–10.5)

## 2016-10-11 LAB — T4, FREE: FREE T4: 1.01 ng/dL (ref 0.61–1.12)

## 2016-10-11 LAB — MAGNESIUM: Magnesium: 1.8 mg/dL (ref 1.7–2.4)

## 2016-10-11 LAB — TSH: TSH: 1.487 u[IU]/mL (ref 0.350–4.500)

## 2016-10-11 MED ORDER — LEVALBUTEROL HCL 0.63 MG/3ML IN NEBU
0.6300 mg | INHALATION_SOLUTION | Freq: Four times a day (QID) | RESPIRATORY_TRACT | Status: DC
Start: 2016-10-11 — End: 2016-10-11

## 2016-10-11 MED ORDER — TRAMADOL HCL 50 MG PO TABS
50.0000 mg | ORAL_TABLET | Freq: Four times a day (QID) | ORAL | Status: AC | PRN
  Administered 2016-10-11 – 2016-10-12 (×2): 50 mg via ORAL
  Filled 2016-10-11 (×2): qty 1

## 2016-10-11 MED ORDER — LEVOFLOXACIN 750 MG PO TABS
750.0000 mg | ORAL_TABLET | ORAL | Status: DC
  Administered 2016-10-11 – 2016-10-13 (×3): 750 mg via ORAL
  Filled 2016-10-11 (×3): qty 1

## 2016-10-11 MED ORDER — LEVALBUTEROL HCL 0.63 MG/3ML IN NEBU
0.6300 mg | INHALATION_SOLUTION | Freq: Four times a day (QID) | RESPIRATORY_TRACT | Status: DC | PRN
  Administered 2016-10-12 – 2016-10-19 (×2): 0.63 mg via RESPIRATORY_TRACT
  Filled 2016-10-11 (×2): qty 3

## 2016-10-11 MED ORDER — IOPAMIDOL (ISOVUE-370) INJECTION 76%
INTRAVENOUS | Status: AC
  Administered 2016-10-11: 100 mL
  Filled 2016-10-11: qty 100

## 2016-10-11 MED ORDER — LEVALBUTEROL HCL 0.63 MG/3ML IN NEBU
0.6300 mg | INHALATION_SOLUTION | Freq: Four times a day (QID) | RESPIRATORY_TRACT | Status: DC
  Administered 2016-10-11: 0.63 mg via RESPIRATORY_TRACT
  Filled 2016-10-11: qty 3

## 2016-10-11 NOTE — Progress Notes (Addendum)
PROGRESS NOTE                                                                                                                                                                                                             Patient Demographics:    Amanda Hubbard, is a 83 y.o. female, DOB - May 29, 1928, [id number]  Admit date - 10/08/2016   Admitting Physician Waldemar Dickens, MD  Outpatient Primary MD for the patient is Redge Gainer, MD  LOS - 3  Outpatient Specialists: NONE  Chief Complaint  Patient presents with  . Cerebrovascular Accident       Brief Narrative   83 year old female with history of CVA on October 2017 (hospitalized in [place]) with residual speech impairment, chronically bedbound, coronary artery disease status post CABG in 2002, hypertension, dyslipidemia brought to the ED with worsening speech despite ongoing speech therapy and altered mental status. As per daughter she was acting differently over the past few days. Husband also noted some left-sided facial droop and complained of left wrist pain. Initially in the ED course stroke was called but head CT was negative and no residual weakness was noted. As per husband she was diagnosed with cholecystitis 2 months back and was awaiting outpatient surgical follow-up. In the ED she had fever of 100.75F,  tachycardic with heart rate in the veins of 105-141, tachypneic meeting criteria for sepsis. Chest x-ray showed retrocardiac opacity. Blood work showed elevated white count of about 20 4K and mild hypokalemia, mild renal insufficiency and elevated lactic acid of 2. Code sepsis was initiated and patient given IV fluid bolus along with vancomycin and Levaquin EKG showed anterolateral T-wave inversion and troponin of 0.04. Admitted to telemetry for sepsis secondary to pneumonia.   Subjective:   Patient appears much alert and oriented from previous day (as per daughter  at bedside) denies any pain, dyspnea or palpitation but remains tachycardic on the monitor up to 120s   Assessment  & Plan :    Principal Problem:   Sepsis ([place]) Secondary to lobar pneumonia. Sepsis resolved with hydration and empiric antibiotic. Leukocytosis improved. Still remains tachycardic. Blood cultures 1/2 growing coag-negative staph (suspect contamination). will narrow antibiotic Levaquin. Remains afebrile. Continue O2 via nasal cannula.   Active Problems: Sinus tachycardia Heart rate in the 120s. Will check 2-D echo. Sats stable. Check 2-D  echo. Will obtain CT angiogram of the chest rule out PE. Switch DuoNeb to xopenex. Check TSH   Metabolic encephalopathy Likely associated with sepsis. Has speech impairment with stroke a few months back. MRI brain shows subacute infarct recommend follow-up MRI in one month. Per daughter she is back to her baseline mental status.  History of stroke with speech impairment Continue aspirin and statin. Daughter reports she is mostly bedbound. PT evaluation.  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 Was seen by [facility] in past and recommended no future follow-up. Patient does not have any abdominal pain or distention at present. Will recommend outpatient GI evaluation if needed    left wrist pain Had a small hematoma on presentation. X-ray negative for fracture showed severe   CAD status post CABG Stable. Continue home medications    Type 2 diabetes mellitus, controlled ([place]) Stable. A1c of 6.5. Monitor on sliding sale coverage.    Hypokalemia Replenished     Code Status : DO NOT RESUSCITATE  Family Communication  : Daughter at bedside  Disposition Plan  : Home pending clinical improvement  Barriers For Discharge : Active infection, tachycardia  Consults  :  None  Procedures  :  MRI brain 2-D echo and CT angiogram of the chest ordered  DVT Prophylaxis  :  Lovenox -  Lab Results  Component Value Date   PLT 278  10/11/2016    Antibiotics  :   Anti-infectives    Start     Dose/Rate Route Frequency Ordered Stop   10/10/16 1445  aztreonam (AZACTAM) 1 g in dextrose 5 % 50 mL IVPB     1 g 100 mL/hr over 30 Minutes Intravenous Every 8 hours 10/10/16 1430     10/10/16 1300  levofloxacin (LEVAQUIN) IVPB 750 mg  Status:  Discontinued     750 mg 100 mL/hr over 90 Minutes Intravenous Every 48 hours 10/08/16 1405 10/10/16 1358   10/09/16 1300  vancomycin (VANCOCIN) 500 mg in sodium chloride 0.9 % 100 mL IVPB     500 mg 100 mL/hr over 60 Minutes Intravenous Every 24 hours 10/08/16 1405     10/08/16 1645  levofloxacin (LEVAQUIN) IVPB 750 mg  Status:  Discontinued     750 mg 100 mL/hr over 90 Minutes Intravenous  Once 10/08/16 1633 10/09/16 1307   10/08/16 1645  vancomycin (VANCOCIN) IVPB 1000 mg/200 mL premix     1,000 mg 200 mL/hr over 60 Minutes Intravenous  Once 10/08/16 1633     10/08/16 1245  levofloxacin (LEVAQUIN) IVPB 750 mg     750 mg 100 mL/hr over 90 Minutes Intravenous  Once 10/08/16 1233 10/08/16 1512   10/08/16 1245  vancomycin (VANCOCIN) IVPB 1000 mg/200 mL premix     1,000 mg 200 mL/hr over 60 Minutes Intravenous  Once 10/08/16 1233 10/08/16 1513        Objective:   Vitals:   10/11/16 0257 10/11/16 0558 10/11/16 0600 10/11/16 0922  BP:  (!) 152/84  (!) 165/82  Pulse:  (!) 42 (!) 125 (!) 122  Resp:  18    Temp:  97.6 F (36.4 C)    TempSrc:  Oral    SpO2:  (!) 89% 95% 97%  Weight: 57.1 kg (125 lb 12.8 oz)       Wt Readings from Last 3 Encounters:  10/11/16 57.1 kg (125 lb 12.8 oz)  06/05/16 49 kg (108 lb)  08/29/15 45.8 kg (101 lb)     Intake/Output Summary (Last 24 hours)  at 10/11/16 1508 Last data filed at 10/10/16 2216  Gross per 24 hour  Intake          4028.33 ml  Output              200 ml  Net          3828.33 ml     Physical Exam  Gen: not in distress, Fatigued HEENT: no pallor, moist mucosa, supple neck Chest: Scattered rhonchi bilaterally CVS: S1  and S2 tachycardic, no murmurs rub or gallop GI: soft, NT, ND, BS+ Musculoskeletal: warm, no edema CNS: Alert and awake, oriented to place and person, slurred speech    Data Review:    CBC  Recent Labs Lab 10/08/16 1240 10/08/16 1300 10/08/16 2155 10/10/16 1130 10/11/16 0637  WBC 23.9*  --  16.2* 19.9* 12.0*  HGB 13.0 13.3 11.4* 10.9* 9.9*  HCT 39.2 39.0 34.9* 33.7* 30.5*  PLT 266  --  246 278 278  MCV 99.2  --  98.9 98.5 98.7  MCH 32.9  --  32.3 31.9 32.0  MCHC 33.2  --  32.7 32.3 32.5  RDW 14.3  --  14.8 14.4 14.5  LYMPHSABS 1.0  --  1.4  --   --   MONOABS 1.1*  --  1.1*  --   --   EOSABS 0.0  --  0.0  --   --   BASOSABS 0.0  --  0.0  --   --     Chemistries   Recent Labs Lab 10/08/16 1240 10/08/16 1300 10/08/16 2155 10/10/16 1130 10/11/16 0637  NA 139 140 140 145 145  K 3.4* 3.4* 3.6 3.5 3.5  CL 103 101 107 117* 117*  CO2 23  --  22 21* 21*  GLUCOSE 134* 139* 126* 148* 106*  BUN 21* 26* 20 12 8   CREATININE 1.14* 1.20* 1.00 0.80 0.76  CALCIUM 9.4  --  8.4* 8.3* 8.1*  MG  --   --   --  1.8 1.8  AST 16  --  16  --   --   ALT 10*  --  9*  --   --   ALKPHOS 52  --  46  --   --   BILITOT 2.1*  --  1.9*  --   --    ------------------------------------------------------------------------------------------------------------------ No results for input(s): CHOL, HDL, LDLCALC, TRIG, CHOLHDL, LDLDIRECT in the last 72 hours.  No results found for: HGBA1C ------------------------------------------------------------------------------------------------------------------ No results for input(s): TSH, T4TOTAL, T3FREE, THYROIDAB in the last 72 hours.  Invalid input(s): FREET3 ------------------------------------------------------------------------------------------------------------------ No results for input(s): VITAMINB12, FOLATE, FERRITIN, TIBC, IRON, RETICCTPCT in the last 72 hours.  Coagulation profile  Recent Labs Lab 10/08/16 1240 10/08/16 2155  INR 1.12  1.24    No results for input(s): DDIMER in the last 72 hours.  Cardiac Enzymes  Recent Labs Lab 10/08/16 2147 10/09/16 0647  TROPONINI 0.06* 0.06*   ------------------------------------------------------------------------------------------------------------------ No results found for: BNP  Inpatient Medications  Scheduled Meds: . amLODipine  5 mg Oral Daily  . aspirin EC  81 mg Oral Daily  . atorvastatin  20 mg Oral QHS  . aztreonam  1 g Intravenous Q8H  . fentaNYL  50 mcg Transdermal Q72H  . gabapentin  300 mg Oral TID  . heparin  5,000 Units Subcutaneous Q8H  . iopamidol      . levalbuterol  0.63 mg Nebulization Q6H  . senna-docusate  2 tablet Oral Daily  . sodium chloride flush  3  mL Intravenous Q12H  . sulfacetamide  1 drop Right Eye Q4H  . temazepam  30 mg Oral QHS  . vancomycin  500 mg Intravenous Q24H  . vancomycin  1,000 mg Intravenous Once   Continuous Infusions: PRN Meds:.acetaminophen, bisacodyl, linaclotide, LORazepam, metoprolol, ondansetron **OR** ondansetron (ZOFRAN) IV  Micro Results Recent Results (from the past 240 hour(s))  Blood Culture (routine x 2)     Status: None (Preliminary result)   Collection Time: 10/08/16 12:44 PM  Result Value Ref Range Status   Specimen Description BLOOD LEFT FOREARM  Final   Special Requests BOTTLES DRAWN AEROBIC AND ANAEROBIC 5CC  Final   Culture NO GROWTH 2 DAYS  Final   Report Status PENDING  Incomplete  Blood Culture (routine x 2)     Status: Abnormal   Collection Time: 10/08/16 12:45 PM  Result Value Ref Range Status   Specimen Description BLOOD RIGHT FOREARM  Final   Special Requests BOTTLES DRAWN AEROBIC AND ANAEROBIC 5CC  Final   Culture  Setup Time   Final    GRAM POSITIVE COCCI IN CLUSTERS Organism ID to follow CRITICAL RESULT CALLED TO, READ BACK BY AND VERIFIED WITH: AEdwina Barth 10:20 10/09/16 ([email or web address])    Culture (A)  Final    STAPHYLOCOCCUS SPECIES (COAGULASE NEGATIVE) THE SIGNIFICANCE OF  ISOLATING THIS ORGANISM FROM A SINGLE SET OF BLOOD CULTURES WHEN MULTIPLE SETS ARE DRAWN IS UNCERTAIN. PLEASE NOTIFY THE MICROBIOLOGY DEPARTMENT WITHIN ONE WEEK IF SPECIATION AND SENSITIVITIES ARE REQUIRED.    Report Status 10/11/2016 FINAL  Final  Blood Culture ID Panel (Reflexed)     Status: Abnormal   Collection Time: 10/08/16 12:45 PM  Result Value Ref Range Status   Enterococcus species NOT DETECTED NOT DETECTED Final   Listeria monocytogenes NOT DETECTED NOT DETECTED Final   Staphylococcus species DETECTED (A) NOT DETECTED Final    Comment: CRITICAL RESULT CALLED TO, READ BACK BY AND VERIFIED WITH: AElta [place].D. 10:20 10/09/16 ([email or web address])    Staphylococcus aureus NOT DETECTED NOT DETECTED Final   Methicillin resistance DETECTED (A) NOT DETECTED Final    Comment: CRITICAL RESULT CALLED TO, READ BACK BY AND VERIFIED WITH: AElta [place].D. 10:20 10/09/16 ([email or web address])    Streptococcus species NOT DETECTED NOT DETECTED Final   Streptococcus agalactiae NOT DETECTED NOT DETECTED Final   Streptococcus pneumoniae NOT DETECTED NOT DETECTED Final   Streptococcus pyogenes NOT DETECTED NOT DETECTED Final   Acinetobacter baumannii NOT DETECTED NOT DETECTED Final   Enterobacteriaceae species NOT DETECTED NOT DETECTED Final   Enterobacter cloacae complex NOT DETECTED NOT DETECTED Final   Escherichia coli NOT DETECTED NOT DETECTED Final   Klebsiella oxytoca NOT DETECTED NOT DETECTED Final   Klebsiella pneumoniae NOT DETECTED NOT DETECTED Final   Proteus species NOT DETECTED NOT DETECTED Final   Serratia marcescens NOT DETECTED NOT DETECTED Final   Haemophilus influenzae NOT DETECTED NOT DETECTED Final   Neisseria meningitidis NOT DETECTED NOT DETECTED Final   Pseudomonas aeruginosa NOT DETECTED NOT DETECTED Final   Candida albicans NOT DETECTED NOT DETECTED Final   Candida glabrata NOT DETECTED NOT DETECTED Final   Candida krusei NOT DETECTED NOT DETECTED Final   Candida parapsilosis NOT  DETECTED NOT DETECTED Final   Candida tropicalis NOT DETECTED NOT DETECTED Final  Urine culture     Status: None   Collection Time: 10/08/16  4:39 PM  Result Value Ref Range Status   Specimen Description URINE, CATHETERIZED  Final   Special Requests NONE  Final   Culture  NO GROWTH  Final   Report Status 10/09/2016 FINAL  Final    Radiology Reports Dg Chest 1 View  Result Date: 10/08/2016 CLINICAL DATA:  [place] while at home today.  LEFT wrist pain. EXAM: CHEST 1 VIEW COMPARISON:  Chest radiograph October 08, 2016 at 1031 hours FINDINGS: Cardiac silhouette is mildly enlarged unchanged. Status post median sternotomy for CABG. Mildly calcified aortic knob. Similar strandy densities projecting LEFT lung base without pleural effusion or focal consolidation. No pneumothorax. Osteopenia. Soft tissue planes are nonsuspicious; calcifications in neck are likely vascular IMPRESSION: Similar LEFT lung base atelectasis/ scarring. Mild cardiomegaly. Electronically Signed   By: Elon Alas M.D.   On: 10/08/2016 17:13   Dg Chest 2 View  Result Date: 10/08/2016 CLINICAL DATA:  Low grade temperature, possible infection EXAM: CHEST  2 VIEW COMPARISON:  07/09/2016 FINDINGS: Cardiomediastinal silhouette is stable. There is streaky left base retrocardiac atelectasis or early infiltrate. No pulmonary edema. Osteopenia and degenerative changes thoracic spine. IMPRESSION: Streaky left base retrocardiac atelectasis or early infiltrate. No pulmonary edema. Electronically Signed   By: Lahoma Crocker M.D.   On: 10/08/2016 11:46   Dg Wrist 2 Views Left  Result Date: 10/08/2016 CLINICAL DATA:  [place] while at home today.  LEFT wrist pain. EXAM: LEFT WRIST - 2 VIEW COMPARISON:  None. FINDINGS: No acute fracture deformity or dislocation. Osteopenia without destructive bony lesions. Moderate to severe first carpometacarpal joint space narrowing, periarticular sclerosis and marginal spurring compatible with osteoarthrosis. Faint  intra-articular calcifications compatible with CPPD. Dorsal wrist soft tissue swelling without subcutaneous gas or radiopaque foreign bodies. IMPRESSION: Soft tissue swelling without acute fracture deformity or dislocation. CPPD. Moderate to severe first carpometacarpal osteoarthrosis. Electronically Signed   By: Elon Alas M.D.   On: 10/08/2016 17:15   Ct Head Wo Contrast  Result Date: 10/08/2016 CLINICAL DATA:  Left facial droop. Left-sided weakness beginning last night. EXAM: CT HEAD WITHOUT CONTRAST TECHNIQUE: Contiguous axial images were obtained from the base of the skull through the vertex without intravenous contrast. COMPARISON:  07/09/2016. FINDINGS: Brain: There is atrophy and chronic small vessel disease changes. No acute intracranial abnormality. Specifically, no hemorrhage, hydrocephalus, mass lesion, acute infarction, or significant intracranial injury. Vascular: No hyperdense vessel or unexpected calcification. Skull: No acute calvarial abnormality. Sinuses/Orbits: Visualized paranasal sinuses and mastoids clear. Orbital soft tissues unremarkable. Other: None IMPRESSION: No acute intracranial abnormality. Atrophy, chronic microvascular disease. Electronically Signed   By: Rolm Baptise M.D.   On: 10/08/2016 11:35   Mr Brain Wo Contrast  Result Date: 10/08/2016 CLINICAL DATA:  Encephalopathy, weakness and altered mental status. History of hypertension, hyperlipidemia. EXAM: MRI HEAD WITHOUT CONTRAST TECHNIQUE: Multiplanar, multiecho pulse sequences of the brain and surrounding structures were obtained without intravenous contrast. COMPARISON:  CT HEAD October 08, 2016 at 1118 hours and MRI of the head July 10, 2016 FINDINGS: Multiple sequences are moderately motion degraded. BRAIN: Faint reduced diffusion LEFT temporal lobe associated with confluent new FLAIR white matter T2 hyperintensities, no focal atrophy. Normalized ADC values. No susceptibility artifact to suggest hemorrhage. Old  bilateral small cerebellar infarcts. Old RIGHT posterior insula infarct. Patchy to confluent supratentorial white matter FLAIR T2 hyperintensities ventricles and sulci are overall normal for patient's age. No midline shift, mass effect or abnormal extra-axial fluid collections. VASCULAR: Normal major intracranial vascular flow voids present at skull base. SKULL AND UPPER CERVICAL SPINE: No abnormal sellar expansion. No suspicious calvarial bone marrow signal. Craniocervical junction maintained. Subcentimeter probable pars intermedius cyst. SINUSES/ORBITS: RIGHT mastoid effusion.  Trace paranasal sinus mucosal thickening. Status post LEFT ocular lens implant. The included ocular globes and orbital contents are non-suspicious. OTHER: None. IMPRESSION: No acute intracranial process on this motion degraded examination. LEFT temporal lobe subacute infarct, less likely infiltrative tumor. Recommend 1 month follow-up. Stable moderate to severe chronic small vessel ischemic disease, old small RIGHT MCA territory infarct and old cerebellar infarcts. Electronically Signed   By: Elon Alas M.D.   On: 10/08/2016 19:59   Ct Abdomen Pelvis W Contrast  Result Date: 10/08/2016 CLINICAL DATA:  Gallbladder disease. EXAM: CT ABDOMEN AND PELVIS WITH CONTRAST TECHNIQUE: Multidetector CT imaging of the abdomen and pelvis was performed using the standard protocol following bolus administration of intravenous contrast. CONTRAST:  1 ISOVUE-300 IOPAMIDOL (ISOVUE-300) INJECTION 61% COMPARISON:  CT scan of January 08, 2015 and August 20, 2012. FINDINGS: Lower chest: No acute abnormality. Hepatobiliary: Distended gallbladder is noted with cholelithiasis. No focal parenchymal abnormality is noted in the liver. Mild intrahepatic and extrahepatic biliary dilatation is noted which was present on prior exam. Pancreas: 5.4 x 4.5 cm multi-cystic mass arises from the pancreatic head. Spleen: Normal in size without focal abnormality.  Adrenals/Urinary Tract: Status post right nephrectomy. Adrenal glands appear normal. Stable left renal cyst is noted. No hydronephrosis or renal obstruction is noted. Urinary bladder appears normal. Stomach/Bowel: There is no evidence of bowel obstruction. Vascular/Lymphatic: Aortic atherosclerosis. No enlarged abdominal or pelvic lymph nodes. Reproductive: Status post hysterectomy. No adnexal masses. Other: No abdominal wall hernia or abnormality. No abdominopelvic ascites. Musculoskeletal: No acute or significant osseous findings. IMPRESSION: Distended gallbladder were cholelithiasis, but no evidence of cholecystitis. Mild intrahepatic and extrahepatic biliary dilatation is noted which was present on prior exam. Correlation with liver function tests is recommended to rule out biliary obstruction. Status post right nephrectomy. Aortic atherosclerosis. 5.4 x 4.5 cm multi-cystic mass seen in pancreatic head. This is concerning for either serous cystadenoma or possibly mucinous cystic neoplasm. Further evaluation with endoscopic ultrasound and cyst aspiration should be considered, if not already performed. Electronically Signed   By: Marijo Conception, M.D.   On: 10/08/2016 15:24    Time Spent in minutes  35   Louellen Molder M.D on 10/11/2016 at 3:08 PM  Between 7am to 7pm - Pager - [phone]  After 7pm go to [email or web address] - password [facility]  [facility] -  Office  [phone]

## 2016-10-11 NOTE — Progress Notes (Signed)
Pharmacy Antibiotic Note  Amanda Hubbard is a 83 y.o. female admitted on 10/08/2016 with pneumonia.  Continues on Vancomycin -- Day # 4 Levaquin switched to aztreonam 1/10, Day # 4  Blood cultures negative (contaminant), afebrile, WBC trending back down Scr stable  Plan: Vancomycin 500mg  IV every 24 hours.  Goal trough 15-20 mcg/mL.  Consider stopping vancomycin?   Levaquin 750mg  IV q 48h switched to aztreonam 1 gram iv Q 8 hours Monitor culture data, renal function and clinical course VT at SS prn  Weight: 125 lb 12.8 oz (57.1 kg)  Temp (24hrs), Avg:98.2 F (36.8 C), Min:97.6 F (36.4 C), Max:99 F (37.2 C)   Recent Labs Lab 10/08/16 1240 10/08/16 1300 10/08/16 2147 10/08/16 2155 10/10/16 1130 10/11/16 0637  WBC 23.9*  --   --  16.2* 19.9* 12.0*  CREATININE 1.14* 1.20*  --  1.00 0.80 0.76  LATICACIDVEN  --  2.08* 2.1*  --   --   --     Estimated Creatinine Clearance: 41.8 mL/min (by C-G formula based on SCr of 0.76 mg/dL).    Allergies  Allergen Reactions  . Bee Venom Anaphylaxis  . Penicillins Other (See Comments)   Thank you Anette Guarneri, PharmD [phone]  10/11/2016 8:27 AM

## 2016-10-12 ENCOUNTER — Inpatient Hospital Stay (HOSPITAL_COMMUNITY): Payer: PPO

## 2016-10-12 DIAGNOSIS — I481 Persistent atrial fibrillation: Secondary | ICD-10-CM

## 2016-10-12 DIAGNOSIS — I6789 Other cerebrovascular disease: Secondary | ICD-10-CM

## 2016-10-12 LAB — CBC
HCT: 29.9 % — ABNORMAL LOW (ref 36.0–46.0)
Hemoglobin: 9.9 g/dL — ABNORMAL LOW (ref 12.0–15.0)
MCH: 32.5 pg (ref 26.0–34.0)
MCHC: 33.1 g/dL (ref 30.0–36.0)
MCV: 98 fL (ref 78.0–100.0)
Platelets: 302 10*3/uL (ref 150–400)
RBC: 3.05 MIL/uL — ABNORMAL LOW (ref 3.87–5.11)
RDW: 14.8 % (ref 11.5–15.5)
WBC: 11.4 10*3/uL — ABNORMAL HIGH (ref 4.0–10.5)

## 2016-10-12 LAB — ECHOCARDIOGRAM COMPLETE: WEIGHTICAEL: 1985.9 [oz_av]

## 2016-10-12 LAB — BASIC METABOLIC PANEL
Anion gap: 8 (ref 5–15)
BUN: 6 mg/dL (ref 6–20)
CALCIUM: 8.3 mg/dL — AB (ref 8.9–10.3)
CO2: 21 mmol/L — ABNORMAL LOW (ref 22–32)
Chloride: 116 mmol/L — ABNORMAL HIGH (ref 101–111)
Creatinine, Ser: 0.88 mg/dL (ref 0.44–1.00)
GFR calc Af Amer: >60 mL/min (ref >60)
GFR, EST NON AFRICAN AMERICAN: 58 mL/min — AB (ref >60)
GLUCOSE: 99 mg/dL (ref 65–99)
Potassium: 3.3 mmol/L — ABNORMAL LOW (ref 3.5–5.1)
Sodium: 145 mmol/L (ref 135–145)

## 2016-10-12 LAB — MAGNESIUM: MAGNESIUM: 1.8 mg/dL (ref 1.7–2.4)

## 2016-10-12 MED ORDER — POLYETHYLENE GLYCOL 3350 17 G PO PACK
17.0000 g | PACK | Freq: Every day | ORAL | Status: DC
  Administered 2016-10-12 – 2016-10-20 (×8): 17 g via ORAL
  Filled 2016-10-12 (×8): qty 1

## 2016-10-12 MED ORDER — APIXABAN 2.5 MG PO TABS
2.5000 mg | ORAL_TABLET | Freq: Two times a day (BID) | ORAL | Status: DC
  Administered 2016-10-12 – 2016-10-20 (×16): 2.5 mg via ORAL
  Filled 2016-10-12 (×16): qty 1

## 2016-10-12 MED ORDER — MAGNESIUM SULFATE IN D5W 1-5 GM/100ML-% IV SOLN
1.0000 g | Freq: Once | INTRAVENOUS | Status: AC
  Administered 2016-10-12: 1 g via INTRAVENOUS
  Filled 2016-10-12: qty 100

## 2016-10-12 MED ORDER — PERFLUTREN LIPID MICROSPHERE
1.0000 mL | INTRAVENOUS | Status: AC | PRN
  Administered 2016-10-12: 2 mL via INTRAVENOUS
  Filled 2016-10-12: qty 10

## 2016-10-12 MED ORDER — POTASSIUM CHLORIDE CRYS ER 20 MEQ PO TBCR
40.0000 meq | EXTENDED_RELEASE_TABLET | ORAL | Status: AC
  Administered 2016-10-12 (×2): 40 meq via ORAL
  Filled 2016-10-12 (×2): qty 2

## 2016-10-12 MED ORDER — BISACODYL 5 MG PO TBEC
10.0000 mg | DELAYED_RELEASE_TABLET | Freq: Every day | ORAL | Status: DC | PRN
Start: 2016-10-12 — End: 2016-10-20
  Administered 2016-10-14 – 2016-10-15 (×2): 10 mg via ORAL
  Filled 2016-10-12 (×2): qty 2

## 2016-10-12 MED ORDER — DILTIAZEM HCL 60 MG PO TABS
60.0000 mg | ORAL_TABLET | Freq: Three times a day (TID) | ORAL | Status: DC
  Administered 2016-10-12 – 2016-10-13 (×3): 60 mg via ORAL
  Filled 2016-10-12 (×3): qty 1

## 2016-10-12 MED ORDER — DILTIAZEM HCL 25 MG/5ML IV SOLN
10.0000 mg | Freq: Once | INTRAVENOUS | Status: AC
  Administered 2016-10-12: 10 mg via INTRAVENOUS
  Filled 2016-10-12: qty 5

## 2016-10-12 MED ORDER — FLUDROCORTISONE ACETATE 0.1 MG PO TABS
0.2000 mg | ORAL_TABLET | Freq: Every day | ORAL | Status: DC
  Administered 2016-10-12 – 2016-10-13 (×2): 0.2 mg via ORAL
  Filled 2016-10-12 (×3): qty 2

## 2016-10-12 NOTE — Consult Note (Signed)
   [facility] CM Inpatient Consult   10/12/2016  Amanda Hubbard 05/25/1928 [id number]  Patient was referred for pneumonia post hospital follow up.  Inpatient RNCM has referred the patient for [facility] EMMI COPD/PNA. Patient has [employer] plan. Patient admitted for AMS and found to be in sepsis due to PNA. Currently on Abx doing better. She did have episode of tachycardiac this morning but I was told that she was moving around a lot the time. Since then it has slowed down. Met with patient and son at the bedside.  Son given a [place] [facility] brochure to share for his sister. [facility] Care Management does not interfere with any post hospital follow up arranged by the inpatient care management.  For questions, please contact:  Natividad Brood, RN BSN [facility] Liaison  [phone] business mobile phone Toll free office [phone]

## 2016-10-12 NOTE — Care Management Important Message (Signed)
Important Message  Patient Details  Name: Amanda Hubbard MRN: [id number] Date of Birth: [birth year]   Medicare Important Message Given:  Yes    Nathen May 10/12/2016, 12:40 PM

## 2016-10-12 NOTE — Progress Notes (Signed)
  Echocardiogram 2D Echocardiogram has been performed with definity.  Aggie Cosier 10/12/2016, 1:54 PM

## 2016-10-12 NOTE — Progress Notes (Signed)
ANTICOAGULATION CONSULT NOTE - Initial Consult  Pharmacy Consult for Eliquis Indication: atrial fibrillation  Allergies  Allergen Reactions  . Bee Venom Anaphylaxis  . Penicillins Other (See Comments)    Patient Measurements: Weight: 124 lb 1.9 oz (56.3 kg)  Vital Signs: Temp: 98.1 F (36.7 C) (01/12 1410) Temp Source: Oral (01/12 1410) BP: 126/76 (01/12 1410) Pulse Rate: 98 (01/12 1410)   Assessment: 83 yo F presents on 1/8 with recent significant CVA. Found to be in Afib. Starting Eliquis today. Qualifies for lower dose due to age and weight. Hgb lot but stable at 9.9, plts wnl. No s/s of bleed.  Goal of Therapy:  Monitor platelets by anticoagulation protocol: Yes   Plan:  Start Eliquis 2.5mg  PO BID tonight Monitor CBC, s/s of bleed  Elenor Quinones, PharmD, BCPS Clinical Pharmacist Pager [phone] 10/12/2016 5:18 PM

## 2016-10-12 NOTE — Progress Notes (Addendum)
PROGRESS NOTE                                                                                                                                                                                                             Patient Demographics:    Amanda Hubbard, is a 83 y.o. female, DOB - 12/14/1927, [id number]  Admit date - 10/08/2016   Admitting Physician Waldemar Dickens, MD  Outpatient Primary MD for the patient is Redge Gainer, MD  LOS - 4  Outpatient Specialists: NONE  Chief Complaint  Patient presents with  . Cerebrovascular Accident       Brief Narrative   82 year old female with history of CVA on October 2017 (hospitalized in [place]) with residual speech impairment, chronically bedbound, coronary artery disease status post CABG in 2002, hypertension, dyslipidemia brought to the ED with worsening speech despite ongoing speech therapy and altered mental status. As per daughter she was acting differently over the past few days. Husband also noted some left-sided facial droop and complained of left wrist pain. Initially in the ED course stroke was called but head CT was negative and no residual weakness was noted. As per husband she was diagnosed with cholecystitis 2 months back and was awaiting outpatient surgical follow-up. In the ED she had fever of 100.55F,  tachycardic with heart rate in the veins of 105-141, tachypneic meeting criteria for sepsis. Chest x-ray showed retrocardiac opacity. Blood work showed elevated white count of about 20 4K and mild hypokalemia, mild renal insufficiency and elevated lactic acid of 2. Code sepsis was initiated and patient given IV fluid bolus along with vancomycin and Levaquin EKG showed anterolateral T-wave inversion and troponin of 0.04. Admitted to telemetry for sepsis secondary to pneumonia.   Subjective:   Patient continues to be tachycardic up to 140s-150s. Denies any symptoms.  Wanting to go home.   Assessment  & Plan :    Principal Problem:   Sepsis ([place]) Secondary to lobar pneumonia. Sepsis resolved with hydration and empiric antibiotic. Leukocytosis improved. Still remains tachycardic. Blood cultures 1/2 growing coag-negative staph (suspect contamination).  Antibiotic narrowed to Levaquin. Remains afebrile. Continue O2 via nasal cannula.   Active Problems: A. fib with RVR Heart rate continues to be in 120s ranging up to 150s. TSH and free T4 normal. 2-D echo shows EF of 55% with elevated  ventricular end diastolic filling pressure and left atrial filling pressure. Thousand normal except for mild regurgitation. Small area of apical akinesis seen. Added Cardizem to rate control. Patient's CHADS2Vasc is 6. Discussed anticoagulation , different options, risks and benefits with daughter on the phone. Daughter wishes her to be on eliquis.   Thyroid nodule Incidentally seen on CT angiogram. Thyroid function normal. Thyroid ultrasound showed dominant nodules, recommends FNA. Would not plan on biopsy at present given acute illness and can be be done as outpt if pt stable.Daughter informed and she agrees.     Metabolic encephalopathy Likely associated with sepsis. Has speech impairment with stroke a few months back. MRI brain shows subacute infarct recommend follow-up MRI in one month.  Per family she is back to her baseline.  History of stroke with speech impairment Continue aspirin and statin. Patient bedbound at home. Will benefit from anticoagulation. Will discuss with family.  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 Was seen by [facility] in past and recommended no future follow-up. Patient does not have any abdominal pain or distention at present. Will recommend outpatient GI evaluation if needed .   left wrist pain Had a small hematoma on presentation. X-ray negative for fracture showed severe osteoarthritis  CAD status post CABG Stable. Continue home  medications    Type 2 diabetes mellitus, controlled ([place]) Stable. A1c of 6.5. Monitor on sliding sale coverage.    Hypokalemia and hypomagnesemia Replenished  Positive blood culture 1/2 culture on admission growing quite-negative staph. Likely contaminant.   Code Status : DO NOT RESUSCITATE  Family Communication  : Daughter at bedside  Disposition Plan  : Home pending clinical improvement  Barriers For Discharge : Active infection, tachycardia  Consults  :  None  Procedures  :  MRI brain 2-D echo and CT angiogram of the chest ordered  DVT Prophylaxis  :  Lovenox -  Lab Results  Component Value Date   PLT 302 10/12/2016    Antibiotics  :   Anti-infectives    Start     Dose/Rate Route Frequency Ordered Stop   10/11/16 1530  levofloxacin (LEVAQUIN) tablet 750 mg     750 mg Oral Every 48 hours 10/11/16 1515     10/10/16 1445  aztreonam (AZACTAM) 1 g in dextrose 5 % 50 mL IVPB  Status:  Discontinued     1 g 100 mL/hr over 30 Minutes Intravenous Every 8 hours 10/10/16 1430 10/11/16 1515   10/10/16 1300  levofloxacin (LEVAQUIN) IVPB 750 mg  Status:  Discontinued     750 mg 100 mL/hr over 90 Minutes Intravenous Every 48 hours 10/08/16 1405 10/10/16 1358   10/09/16 1300  vancomycin (VANCOCIN) 500 mg in sodium chloride 0.9 % 100 mL IVPB  Status:  Discontinued     500 mg 100 mL/hr over 60 Minutes Intravenous Every 24 hours 10/08/16 1405 10/11/16 1515   10/08/16 1645  levofloxacin (LEVAQUIN) IVPB 750 mg  Status:  Discontinued     750 mg 100 mL/hr over 90 Minutes Intravenous  Once 10/08/16 1633 10/09/16 1307   10/08/16 1645  vancomycin (VANCOCIN) IVPB 1000 mg/200 mL premix     1,000 mg 200 mL/hr over 60 Minutes Intravenous  Once 10/08/16 1633     10/08/16 1245  levofloxacin (LEVAQUIN) IVPB 750 mg     750 mg 100 mL/hr over 90 Minutes Intravenous  Once 10/08/16 1233 10/08/16 1512   10/08/16 1245  vancomycin (VANCOCIN) IVPB 1000 mg/200 mL premix     1,000 mg  200 mL/hr  over 60 Minutes Intravenous  Once 10/08/16 1233 10/08/16 1513        Objective:   Vitals:   10/12/16 1019 10/12/16 1058 10/12/16 1100 10/12/16 1410  BP: (!) 114/51 (!) 122/55 116/83 126/76  Pulse: (!) 106 (!) 139 (!) 148 98  Resp:    20  Temp:    98.1 F (36.7 C)  TempSrc:    Oral  SpO2:    94%  Weight:        Wt Readings from Last 3 Encounters:  10/12/16 56.3 kg (124 lb 1.9 oz)  06/05/16 49 kg (108 lb)  08/29/15 45.8 kg (101 lb)     Intake/Output Summary (Last 24 hours) at 10/12/16 1439 Last data filed at 10/12/16 1120  Gross per 24 hour  Intake                0 ml  Output              150 ml  Net             -150 ml     Physical Exam  Gen: not in distress, Fatigued HEENT:  moist mucosa, supple neck Chest: Ear bilaterally CVS: S1 and S2 irregular, no murmurs rub or gallop GI: soft, NT, ND, BS+ Musculoskeletal: warm, no edema CNS: Alert and awake, oriented to place and person, slurred speech    Data Review:    CBC  Recent Labs Lab 10/08/16 1240 10/08/16 1300 10/08/16 2155 10/10/16 1130 10/11/16 0637 10/12/16 0653  WBC 23.9*  --  16.2* 19.9* 12.0* 11.4*  HGB 13.0 13.3 11.4* 10.9* 9.9* 9.9*  HCT 39.2 39.0 34.9* 33.7* 30.5* 29.9*  PLT 266  --  246 278 278 302  MCV 99.2  --  98.9 98.5 98.7 98.0  MCH 32.9  --  32.3 31.9 32.0 32.5  MCHC 33.2  --  32.7 32.3 32.5 33.1  RDW 14.3  --  14.8 14.4 14.5 14.8  LYMPHSABS 1.0  --  1.4  --   --   --   MONOABS 1.1*  --  1.1*  --   --   --   EOSABS 0.0  --  0.0  --   --   --   BASOSABS 0.0  --  0.0  --   --   --     Chemistries   Recent Labs Lab 10/08/16 1240 10/08/16 1300 10/08/16 2155 10/10/16 1130 10/11/16 0637 10/12/16 0653  NA 139 140 140 145 145 145  K 3.4* 3.4* 3.6 3.5 3.5 3.3*  CL 103 101 107 117* 117* 116*  CO2 23  --  22 21* 21* 21*  GLUCOSE 134* 139* 126* 148* 106* 99  BUN 21* 26* 20 12 8 6   CREATININE 1.14* 1.20* 1.00 0.80 0.76 0.88  CALCIUM 9.4  --  8.4* 8.3* 8.1* 8.3*  MG  --   --    --  1.8 1.8 1.8  AST 16  --  16  --   --   --   ALT 10*  --  9*  --   --   --   ALKPHOS 52  --  46  --   --   --   BILITOT 2.1*  --  1.9*  --   --   --    ------------------------------------------------------------------------------------------------------------------ No results for input(s): CHOL, HDL, LDLCALC, TRIG, CHOLHDL, LDLDIRECT in the last 72 hours.  No results found for: HGBA1C ------------------------------------------------------------------------------------------------------------------  Recent Labs  10/11/16 1552  TSH 1.487   ------------------------------------------------------------------------------------------------------------------ No results for input(s): VITAMINB12, FOLATE, FERRITIN, TIBC, IRON, RETICCTPCT in the last 72 hours.  Coagulation profile  Recent Labs Lab 10/08/16 1240 10/08/16 2155  INR 1.12 1.24    No results for input(s): DDIMER in the last 72 hours.  Cardiac Enzymes  Recent Labs Lab 10/08/16 2147 10/09/16 0647  TROPONINI 0.06* 0.06*   ------------------------------------------------------------------------------------------------------------------ No results found for: BNP  Inpatient Medications  Scheduled Meds: . amLODipine  5 mg Oral Daily  . aspirin EC  81 mg Oral Daily  . atorvastatin  20 mg Oral QHS  . diltiazem  60 mg Oral Q8H  . fentaNYL  50 mcg Transdermal Q72H  . fludrocortisone  0.2 mg Oral Daily  . gabapentin  300 mg Oral TID  . heparin  5,000 Units Subcutaneous Q8H  . levofloxacin  750 mg Oral Q48H  . potassium chloride  40 mEq Oral Q4H  . senna-docusate  2 tablet Oral Daily  . sodium chloride flush  3 mL Intravenous Q12H  . sulfacetamide  1 drop Right Eye Q4H  . temazepam  30 mg Oral QHS  . vancomycin  1,000 mg Intravenous Once   Continuous Infusions: PRN Meds:.acetaminophen, bisacodyl, levalbuterol, linaclotide, LORazepam, metoprolol, ondansetron **OR** ondansetron (ZOFRAN) IV, perflutren lipid  microspheres (DEFINITY) IV suspension  Micro Results Recent Results (from the past 240 hour(s))  Blood Culture (routine x 2)     Status: None (Preliminary result)   Collection Time: 10/08/16 12:44 PM  Result Value Ref Range Status   Specimen Description BLOOD LEFT FOREARM  Final   Special Requests BOTTLES DRAWN AEROBIC AND ANAEROBIC 5CC  Final   Culture NO GROWTH 3 DAYS  Final   Report Status PENDING  Incomplete  Blood Culture (routine x 2)     Status: Abnormal   Collection Time: 10/08/16 12:45 PM  Result Value Ref Range Status   Specimen Description BLOOD RIGHT FOREARM  Final   Special Requests BOTTLES DRAWN AEROBIC AND ANAEROBIC 5CC  Final   Culture  Setup Time   Final    GRAM POSITIVE COCCI IN CLUSTERS Organism ID to follow CRITICAL RESULT CALLED TO, READ BACK BY AND VERIFIED WITH: AEdwina Barth 10:20 10/09/16 ([email or web address])    Culture (A)  Final    STAPHYLOCOCCUS SPECIES (COAGULASE NEGATIVE) THE SIGNIFICANCE OF ISOLATING THIS ORGANISM FROM A SINGLE SET OF BLOOD CULTURES WHEN MULTIPLE SETS ARE DRAWN IS UNCERTAIN. PLEASE NOTIFY THE MICROBIOLOGY DEPARTMENT WITHIN ONE WEEK IF SPECIATION AND SENSITIVITIES ARE REQUIRED.    Report Status 10/11/2016 FINAL  Final  Blood Culture ID Panel (Reflexed)     Status: Abnormal   Collection Time: 10/08/16 12:45 PM  Result Value Ref Range Status   Enterococcus species NOT DETECTED NOT DETECTED Final   Listeria monocytogenes NOT DETECTED NOT DETECTED Final   Staphylococcus species DETECTED (A) NOT DETECTED Final    Comment: CRITICAL RESULT CALLED TO, READ BACK BY AND VERIFIED WITH: AElta [place].D. 10:20 10/09/16 ([email or web address])    Staphylococcus aureus NOT DETECTED NOT DETECTED Final   Methicillin resistance DETECTED (A) NOT DETECTED Final    Comment: CRITICAL RESULT CALLED TO, READ BACK BY AND VERIFIED WITH: AElta [place].D. 10:20 10/09/16 ([email or web address])    Streptococcus species NOT DETECTED NOT DETECTED Final   Streptococcus agalactiae NOT DETECTED NOT  DETECTED Final   Streptococcus pneumoniae NOT DETECTED NOT DETECTED Final   Streptococcus pyogenes NOT DETECTED NOT DETECTED Final   Acinetobacter baumannii NOT DETECTED NOT DETECTED Final  Enterobacteriaceae species NOT DETECTED NOT DETECTED Final   Enterobacter cloacae complex NOT DETECTED NOT DETECTED Final   Escherichia coli NOT DETECTED NOT DETECTED Final   Klebsiella oxytoca NOT DETECTED NOT DETECTED Final   Klebsiella pneumoniae NOT DETECTED NOT DETECTED Final   Proteus species NOT DETECTED NOT DETECTED Final   Serratia marcescens NOT DETECTED NOT DETECTED Final   Haemophilus influenzae NOT DETECTED NOT DETECTED Final   Neisseria meningitidis NOT DETECTED NOT DETECTED Final   Pseudomonas aeruginosa NOT DETECTED NOT DETECTED Final   Candida albicans NOT DETECTED NOT DETECTED Final   Candida glabrata NOT DETECTED NOT DETECTED Final   Candida krusei NOT DETECTED NOT DETECTED Final   Candida parapsilosis NOT DETECTED NOT DETECTED Final   Candida tropicalis NOT DETECTED NOT DETECTED Final  Urine culture     Status: None   Collection Time: 10/08/16  4:39 PM  Result Value Ref Range Status   Specimen Description URINE, CATHETERIZED  Final   Special Requests NONE  Final   Culture NO GROWTH  Final   Report Status 10/09/2016 FINAL  Final    Radiology Reports Dg Chest 1 View  Result Date: 10/08/2016 CLINICAL DATA:  [place] while at home today.  LEFT wrist pain. EXAM: CHEST 1 VIEW COMPARISON:  Chest radiograph October 08, 2016 at 1031 hours FINDINGS: Cardiac silhouette is mildly enlarged unchanged. Status post median sternotomy for CABG. Mildly calcified aortic knob. Similar strandy densities projecting LEFT lung base without pleural effusion or focal consolidation. No pneumothorax. Osteopenia. Soft tissue planes are nonsuspicious; calcifications in neck are likely vascular IMPRESSION: Similar LEFT lung base atelectasis/ scarring. Mild cardiomegaly. Electronically Signed   By: Elon Alas  M.D.   On: 10/08/2016 17:13   Dg Chest 2 View  Result Date: 10/08/2016 CLINICAL DATA:  Low grade temperature, possible infection EXAM: CHEST  2 VIEW COMPARISON:  07/09/2016 FINDINGS: Cardiomediastinal silhouette is stable. There is streaky left base retrocardiac atelectasis or early infiltrate. No pulmonary edema. Osteopenia and degenerative changes thoracic spine. IMPRESSION: Streaky left base retrocardiac atelectasis or early infiltrate. No pulmonary edema. Electronically Signed   By: Lahoma Crocker M.D.   On: 10/08/2016 11:46   Dg Wrist 2 Views Left  Result Date: 10/08/2016 CLINICAL DATA:  [place] while at home today.  LEFT wrist pain. EXAM: LEFT WRIST - 2 VIEW COMPARISON:  None. FINDINGS: No acute fracture deformity or dislocation. Osteopenia without destructive bony lesions. Moderate to severe first carpometacarpal joint space narrowing, periarticular sclerosis and marginal spurring compatible with osteoarthrosis. Faint intra-articular calcifications compatible with CPPD. Dorsal wrist soft tissue swelling without subcutaneous gas or radiopaque foreign bodies. IMPRESSION: Soft tissue swelling without acute fracture deformity or dislocation. CPPD. Moderate to severe first carpometacarpal osteoarthrosis. Electronically Signed   By: Elon Alas M.D.   On: 10/08/2016 17:15   Ct Head Wo Contrast  Result Date: 10/08/2016 CLINICAL DATA:  Left facial droop. Left-sided weakness beginning last night. EXAM: CT HEAD WITHOUT CONTRAST TECHNIQUE: Contiguous axial images were obtained from the base of the skull through the vertex without intravenous contrast. COMPARISON:  07/09/2016. FINDINGS: Brain: There is atrophy and chronic small vessel disease changes. No acute intracranial abnormality. Specifically, no hemorrhage, hydrocephalus, mass lesion, acute infarction, or significant intracranial injury. Vascular: No hyperdense vessel or unexpected calcification. Skull: No acute calvarial abnormality. Sinuses/Orbits:  Visualized paranasal sinuses and mastoids clear. Orbital soft tissues unremarkable. Other: None IMPRESSION: No acute intracranial abnormality. Atrophy, chronic microvascular disease. Electronically Signed   By: Rolm Baptise M.D.   On:  10/08/2016 11:35   Ct Angio Chest Pe W Or Wo Contrast  Result Date: 10/11/2016 CLINICAL DATA:  Shortness of breath and sinus tachycardia. Clinical suspicion for pulmonary embolism. EXAM: CT ANGIOGRAPHY CHEST WITH CONTRAST TECHNIQUE: Multidetector CT imaging of the chest was performed using the standard protocol during bolus administration of intravenous contrast. Multiplanar CT image reconstructions and MIPs were obtained to evaluate the vascular anatomy. CONTRAST:  100 mL Isovue 370 COMPARISON:  None. FINDINGS: Cardiovascular: Satisfactory opacification of pulmonary arteries noted, and no pulmonary emboli identified. No evidence of thoracic aortic dissection or aneurysm. Aortic atherosclerosis. Previous coronary artery bypass grafting. Mild cardiomegaly. Left ventricular hypertrophy. 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 Mediastinum/Nodes: No pathologically enlarged lymph nodes within the thorax. A low-attenuation nodule is seen in the left thyroid lobe measuring 2.4 x 2.8 cm. This shows nonspecific features. Lungs/Pleura: Moderate bilateral pleural effusions are seen with mild dependent atelectasis. No evidence of pulmonary consolidation or mass. 4 mm pulmonary nodule seen in anterior left lower lobe on image 87/407. Upper abdomen: Small hiatal hernia. Musculoskeletal: No suspicious bone lesions or other significant abnormality identified. Review of the MIP images confirms the above findings. IMPRESSION: No evidence of pulmonary embolism. Moderate bilateral pleural effusions and dependent atelectasis. 4 mm indeterminate left lower lobe pulmonary nodule. No follow-up needed if patient is low-risk. Non-contrast chest  CT can be considered in 12 months if patient is high-risk. This recommendation follows the consensus statement: Guidelines for Management of Incidental Pulmonary Nodules Detected on CT Images: From the Fleischner Society 2017; Radiology 2017; 284:228-243. 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 2.8 cm left thyroid lobe nodule. Thyroid ultrasound recommended for further evaluation. This follows ACR consensus guidelines: Managing Incidental Thyroid Nodules Detected on Imaging: White Paper of the ACR Incidental Thyroid Findings Committee. J Am Coll Radiol 2015; 12:143-150. Small hiatal hernia. Electronically Signed   By: Earle Gell M.D.   On: 10/11/2016 17:04   Mr Brain Wo Contrast  Result Date: 10/08/2016 CLINICAL DATA:  Encephalopathy, weakness and altered mental status. History of hypertension, hyperlipidemia. EXAM: MRI HEAD WITHOUT CONTRAST TECHNIQUE: Multiplanar, multiecho pulse sequences of the brain and surrounding structures were obtained without intravenous contrast. COMPARISON:  CT HEAD October 08, 2016 at 1118 hours and MRI of the head July 10, 2016 FINDINGS: Multiple sequences are moderately motion degraded. BRAIN: Faint reduced diffusion LEFT temporal lobe associated with confluent new FLAIR white matter T2 hyperintensities, no focal atrophy. Normalized ADC values. No susceptibility artifact to suggest hemorrhage. Old bilateral small cerebellar infarcts. Old RIGHT posterior insula infarct. Patchy to confluent supratentorial white matter FLAIR T2 hyperintensities ventricles and sulci are overall normal for patient's age. No midline shift, mass effect or abnormal extra-axial fluid collections. VASCULAR: Normal major intracranial vascular flow voids present at skull base. SKULL AND UPPER CERVICAL SPINE: No abnormal sellar expansion. No suspicious calvarial bone marrow signal. Craniocervical junction maintained. Subcentimeter probable pars intermedius  cyst. SINUSES/ORBITS: RIGHT mastoid effusion. Trace paranasal sinus mucosal thickening. Status post LEFT ocular lens implant. The included ocular globes and orbital contents are non-suspicious. OTHER: None. IMPRESSION: No acute intracranial process on this motion degraded examination. LEFT temporal lobe subacute infarct, less likely infiltrative tumor. Recommend 1 month follow-up. Stable moderate to severe chronic small vessel ischemic disease, old small RIGHT MCA territory infarct and old cerebellar infarcts. Electronically Signed   By: Elon Alas M.D.   On: 10/08/2016 19:59   Ct Abdomen Pelvis W Contrast  Result Date: 10/08/2016 CLINICAL DATA:  Gallbladder disease. EXAM: CT ABDOMEN AND PELVIS WITH CONTRAST TECHNIQUE: Multidetector CT imaging of the abdomen and pelvis was performed using the standard protocol following bolus administration of intravenous contrast. CONTRAST:  1 ISOVUE-300 IOPAMIDOL (ISOVUE-300) INJECTION 61% COMPARISON:  CT scan of January 08, 2015 and August 20, 2012. FINDINGS: Lower chest: No acute abnormality. Hepatobiliary: Distended gallbladder is noted with cholelithiasis. No focal parenchymal abnormality is noted in the liver. Mild intrahepatic and extrahepatic biliary dilatation is noted which was present on prior exam. Pancreas: 5.4 x 4.5 cm multi-cystic mass arises from the pancreatic head. Spleen: Normal in size without focal abnormality. Adrenals/Urinary Tract: Status post right nephrectomy. Adrenal glands appear normal. Stable left renal cyst is noted. No hydronephrosis or renal obstruction is noted. Urinary bladder appears normal. Stomach/Bowel: There is no evidence of bowel obstruction. Vascular/Lymphatic: Aortic atherosclerosis. No enlarged abdominal or pelvic lymph nodes. Reproductive: Status post hysterectomy. No adnexal masses. Other: No abdominal wall hernia or abnormality. No abdominopelvic ascites. Musculoskeletal: No acute or significant osseous findings. IMPRESSION:  Distended gallbladder were cholelithiasis, but no evidence of cholecystitis. Mild intrahepatic and extrahepatic biliary dilatation is noted which was present on prior exam. Correlation with liver function tests is recommended to rule out biliary obstruction. Status post right nephrectomy. Aortic atherosclerosis. 5.4 x 4.5 cm multi-cystic mass seen in pancreatic head. This is concerning for either serous cystadenoma or possibly mucinous cystic neoplasm. Further evaluation with endoscopic ultrasound and cyst aspiration should be considered, if not already performed. Electronically Signed   By: Marijo Conception, M.D.   On: 10/08/2016 15:24   US Thyroid  Result Date: 10/12/2016 CLINICAL DATA:  Incidental on CT.  Left thyroid nodule. EXAM: THYROID ULTRASOUND TECHNIQUE: Ultrasound examination of the thyroid gland and adjacent soft tissues was performed. COMPARISON:  None. FINDINGS: Parenchymal Echotexture: Mildly heterogenous Isthmus: 0.6 cm. Right lobe: 3.6 x 1.6 x 2.1 cm. Left lobe: 4.6 x 3.1 x 3.2 cm. _________________________________________________________ Estimated total number of nodules >/= 1 cm: 5 Number of spongiform nodules >/=  2 cm not described below (TR1): 0 Number of mixed cystic and solid nodules >/= 1.5 cm not described below (TR2): 0 Nodule # 1: Location: Right; Mid Maximum size: 1.6 cm; Other 2 dimensions: 1.5 x 1.2 cm Composition: solid/almost completely solid (2) Echogenicity: isoechoic (1) Shape: taller-than-wide (3) Margins: smooth (0) Echogenic foci: none (0) ACR TI-RADS total points: 6. ACR TI-RADS risk category: TR4 (4-6 points). ACR TI-RADS recommendations: **Given size (>/= 1.5 cm) and appearance, fine needle aspiration of this moderately suspicious nodule should be considered based on TI-RADS criteria. Nodule # 2: Location: Right; Inferior Maximum size: 1.4 cm; Other 2 dimensions: 0.8 x 1.0 cm Composition: solid/almost completely solid (2) Echogenicity: hypoechoic (2) Shape: not  taller-than-wide (0) Margins: smooth (0) Echogenic foci: none (0) ACR TI-RADS total points: 4. ACR TI-RADS risk category: TR4 (4-6 points). ACR TI-RADS recommendations: *Given size (>/= 1 - 1.4 cm) and appearance, a follow-up ultrasound in 1 year should be considered based on TI-RADS criteria. Nodule # 3: Location: Left; Superior Maximum size: 2.8 cm; Other 2 dimensions: 2.0 x 2.4 cm Composition: solid/almost completely solid (2) Echogenicity: isoechoic (1) Shape: not taller-than-wide (0) Margins: smooth (0) Echogenic foci: none (0) ACR TI-RADS total points: 3. ACR TI-RADS risk category: TR3 (3 points). ACR TI-RADS recommendations: **Given size (>/= 2.5 cm) and appearance, fine needle aspiration of this mildly suspicious nodule should be considered based on TI-RADS criteria. Other scattered nodules are smaller and have  a benign appearance. IMPRESSION: Right and left dominant nodules meet criteria for fine needle aspiration biopsy. 1.4 cm right lower pole nodule meets criteria for annual follow-up. The above is in keeping with the ACR TI-RADS recommendations - J Am Coll Radiol 2017;14:587-595. Electronically Signed   By: Marybelle Killings M.D.   On: 10/12/2016 11:17    Time Spent in minutes  35   Louellen Molder M.D on 10/12/2016 at 2:39 PM  Between 7am to 7pm - Pager - [phone]  After 7pm go to [email or web address] - password [facility]  [facility] -  Office  [phone]

## 2016-10-13 ENCOUNTER — Inpatient Hospital Stay (HOSPITAL_COMMUNITY): Payer: PPO

## 2016-10-13 LAB — BASIC METABOLIC PANEL
ANION GAP: 8 (ref 5–15)
BUN: 7 mg/dL (ref 6–20)
CALCIUM: 8.8 mg/dL — AB (ref 8.9–10.3)
CO2: 23 mmol/L (ref 22–32)
Chloride: 113 mmol/L — ABNORMAL HIGH (ref 101–111)
Creatinine, Ser: 0.86 mg/dL (ref 0.44–1.00)
GFR calc Af Amer: >60 mL/min (ref >60)
GFR, EST NON AFRICAN AMERICAN: 59 mL/min — AB (ref >60)
Glucose, Bld: 123 mg/dL — ABNORMAL HIGH (ref 65–99)
POTASSIUM: 3.7 mmol/L (ref 3.5–5.1)
SODIUM: 144 mmol/L (ref 135–145)

## 2016-10-13 LAB — CULTURE, BLOOD (ROUTINE X 2): Culture: NO GROWTH

## 2016-10-13 LAB — URINALYSIS, ROUTINE W REFLEX MICROSCOPIC
Bilirubin Urine: NEGATIVE
Glucose, UA: NEGATIVE mg/dL
Hgb urine dipstick: NEGATIVE
KETONES UR: NEGATIVE mg/dL
Nitrite: NEGATIVE
PROTEIN: NEGATIVE mg/dL
Specific Gravity, Urine: 1.018 (ref 1.005–1.030)
pH: 5 (ref 5.0–8.0)

## 2016-10-13 MED ORDER — DILTIAZEM HCL 60 MG PO TABS: 60.0000 mg | ORAL_TABLET | Freq: Four times a day (QID) | ORAL | Status: DC

## 2016-10-13 MED ORDER — DILTIAZEM HCL 100 MG IV SOLR
5.0000 mg/h | INTRAVENOUS | Status: DC
  Administered 2016-10-13: 12.5 mg/h via INTRAVENOUS
  Administered 2016-10-13: 10 mg/h via INTRAVENOUS
  Administered 2016-10-13: 5 mg/h via INTRAVENOUS
  Administered 2016-10-14: 12.5 mg/h via INTRAVENOUS
  Administered 2016-10-14 – 2016-10-15 (×2): 10 mg/h via INTRAVENOUS
  Administered 2016-10-16: 2.5 mg/h via INTRAVENOUS
  Filled 2016-10-13 (×7): qty 100

## 2016-10-13 MED ORDER — SODIUM CHLORIDE 0.9 % IV SOLN
INTRAVENOUS | Status: DC
  Administered 2016-10-13: 10:00:00 via INTRAVENOUS

## 2016-10-13 MED ORDER — KETOROLAC TROMETHAMINE 30 MG/ML IJ SOLN
30.0000 mg | Freq: Once | INTRAMUSCULAR | Status: AC
  Administered 2016-10-13: 30 mg via INTRAVENOUS
  Filled 2016-10-13: qty 1

## 2016-10-13 NOTE — Progress Notes (Signed)
NP Lynch informed concerning pt not urinating all through the night. Awaiting any further orders.

## 2016-10-13 NOTE — Progress Notes (Signed)
CM spoke with pt's children(Brenda [phone], Juanda Crumble [phone], Delfino Lovett [phone]) regarding d/c plan. CM was told @ d/c the plan is for mom to return to home with her husband. Pt's caregiver will increase additional hrs in providing  PCS for pt. Children would like mom to continued home health services(RN,SLP) that were in place PTA . CM to make MD aware and continue monitoring disposition needs. Whitman Hero RN,BSN,CM

## 2016-10-13 NOTE — Progress Notes (Addendum)
PROGRESS NOTE                                                                                                                                                                                                             Patient Demographics:    Amanda Hubbard, is a 83 y.o. female, DOB - 1928/07/30, CU:[id number]  Admit date - 10/08/2016   Admitting Physician Waldemar Dickens, MD  Outpatient Primary MD for the patient is Redge Gainer, MD  LOS - 5  Outpatient Specialists: NONE  Chief Complaint  Patient presents with  . Cerebrovascular Accident       Brief Narrative   83 year old female with history of CVA on October 2017 (hospitalized in [place]) with residual speech impairment, chronically bedbound, coronary artery disease status post CABG in 2002, hypertension, dyslipidemia brought to the ED with worsening speech despite ongoing speech therapy and altered mental status. As per daughter she was acting differently over the past few days. Husband also noted some left-sided facial droop and complained of left wrist pain. Initially in the ED course stroke was called but head CT was negative and no residual weakness was noted. As per husband she was diagnosed with cholecystitis 2 months back and was awaiting outpatient surgical follow-up. In the ED she had fever of 100.54F,  tachycardic with heart rate in the veins of 105-141, tachypneic meeting criteria for sepsis. Chest x-ray showed retrocardiac opacity. Blood work showed elevated white count of about 20 4K and mild hypokalemia, mild renal insufficiency and elevated lactic acid of 2. Code sepsis was initiated and patient given IV fluid bolus along with vancomycin and Levaquin EKG showed anterolateral T-wave inversion and troponin of 0.04. Admitted to telemetry for sepsis secondary to pneumonia.   Subjective:   Heart is slightly better but still elevated frequently. Once to go home.   Assessment  & Plan :    Principal Problem:   Sepsis ([place]) Secondary to lobar pneumonia. Sepsis resolved with hydration and empiric antibiotic. Leukocytosis improved. Now in rapid A. fib. Blood cultures 1/2 growing coag-negative staph (suspect contamination).  Antibiotic narrowed to Levaquin.    Active Problems: A. fib with RVR Heart rate continues to be in 120s ranging up to 150s. TSH and free T4 normal. 2-D echo shows EF of 55% with elevated ventricular end diastolic filling pressure and left atrial  filling pressure.  Small area of apical akinesis seen. Patient's CHADS2Vasc is 6. After discussing different options, risk versus benefit with daughter patient started on on eliquis.  Remains in rapid Afib despite increasing cardizem dose. Will start her on cardizem drip.  Thyroid nodule Incidentally seen on CT angiogram. Thyroid function normal. Thyroid ultrasound showed dominant nodules, recommends FNA. Would not plan on biopsy at present given acute illness and can be be done as outpt if pt stable.Daughter informed and she agrees.     Metabolic encephalopathy Likely associated with sepsis. Has speech impairment with stroke a few months back. MRI brain shows subacute infarct recommend follow-up MRI in one month.  Per family she is back to her baseline.  History of stroke with speech impairment Continue aspirin and statin. Patient bedbound at home. Will benefit from anticoagulation. Will discuss with family.  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 Was seen by [facility] in past and recommended no future follow-up. Patient does not have any abdominal pain or distention at present. Will recommend outpatient GI evaluation if needed .   left wrist pain Had a small hematoma on presentation. X-ray negative for fracture showed severe osteoarthritis  CAD status post CABG Stable. Continue home medications    Type 2 diabetes mellitus, controlled ([place]) Stable. A1c of 6.5. Monitor on sliding  sale coverage.    Hypokalemia and hypomagnesemia Replenished  Positive blood culture 1/2 culture on admission growing quite-negative staph. Likely contaminant.    Code Status : DO NOT RESUSCITATE  Family Communication  : None at bedside  Disposition Plan  : Home pending clinical improvement  Barriers For Discharge : Rapid A. fib,   Consults  :  None  Procedures  :  MRI brain 2-D echo  CT angiogram of the chest Renal ultrasound  DVT Prophylaxis  :  Lovenox -  Lab Results  Component Value Date   PLT 302 10/12/2016    Antibiotics  :   Anti-infectives    Start     Dose/Rate Route Frequency Ordered Stop   10/11/16 1530  levofloxacin (LEVAQUIN) tablet 750 mg     750 mg Oral Every 48 hours 10/11/16 1515     10/10/16 1445  aztreonam (AZACTAM) 1 g in dextrose 5 % 50 mL IVPB  Status:  Discontinued     1 g 100 mL/hr over 30 Minutes Intravenous Every 8 hours 10/10/16 1430 10/11/16 1515   10/10/16 1300  levofloxacin (LEVAQUIN) IVPB 750 mg  Status:  Discontinued     750 mg 100 mL/hr over 90 Minutes Intravenous Every 48 hours 10/08/16 1405 10/10/16 1358   10/09/16 1300  vancomycin (VANCOCIN) 500 mg in sodium chloride 0.9 % 100 mL IVPB  Status:  Discontinued     500 mg 100 mL/hr over 60 Minutes Intravenous Every 24 hours 10/08/16 1405 10/11/16 1515   10/08/16 1645  levofloxacin (LEVAQUIN) IVPB 750 mg  Status:  Discontinued     750 mg 100 mL/hr over 90 Minutes Intravenous  Once 10/08/16 1633 10/09/16 1307   10/08/16 1645  vancomycin (VANCOCIN) IVPB 1000 mg/200 mL premix     1,000 mg 200 mL/hr over 60 Minutes Intravenous  Once 10/08/16 1633     10/08/16 1245  levofloxacin (LEVAQUIN) IVPB 750 mg     750 mg 100 mL/hr over 90 Minutes Intravenous  Once 10/08/16 1233 10/08/16 1512   10/08/16 1245  vancomycin (VANCOCIN) IVPB 1000 mg/200 mL premix     1,000 mg 200 mL/hr over 60 Minutes Intravenous  Once  10/08/16 1233 10/08/16 1513        Objective:   Vitals:   10/13/16 0342  10/13/16 0446 10/13/16 0600 10/13/16 1330  BP:  (!) 151/90  (!) 158/84  Pulse:  (!) 119 (!) 125 (!) 102  Resp:  18  18  Temp:  97.7 F (36.5 C)  98.4 F (36.9 C)  TempSrc:  Oral  Oral  SpO2:  96%  95%  Weight: 57.5 kg (126 lb 12.2 oz)       Wt Readings from Last 3 Encounters:  10/13/16 57.5 kg (126 lb 12.2 oz)  06/05/16 49 kg (108 lb)  08/29/15 45.8 kg (101 lb)     Intake/Output Summary (Last 24 hours) at 10/13/16 1510 Last data filed at 10/13/16 0600  Gross per 24 hour  Intake               40 ml  Output                0 ml  Net               40 ml     Physical Exam  Gen: not in distress, Fatigued HEENT:  moist mucosa, supple neck Chest:Clear bilaterally CVS: S1 and S2 irregular, no murmurs rub or gallop GI: soft, NT, ND,  Musculoskeletal: warm, no edema CNS: Alert and awake, oriented to place and person, slurred speech    Data Review:    CBC  Recent Labs Lab 10/08/16 1240 10/08/16 1300 10/08/16 2155 10/10/16 1130 10/11/16 0637 10/12/16 0653  WBC 23.9*  --  16.2* 19.9* 12.0* 11.4*  HGB 13.0 13.3 11.4* 10.9* 9.9* 9.9*  HCT 39.2 39.0 34.9* 33.7* 30.5* 29.9*  PLT 266  --  246 278 278 302  MCV 99.2  --  98.9 98.5 98.7 98.0  MCH 32.9  --  32.3 31.9 32.0 32.5  MCHC 33.2  --  32.7 32.3 32.5 33.1  RDW 14.3  --  14.8 14.4 14.5 14.8  LYMPHSABS 1.0  --  1.4  --   --   --   MONOABS 1.1*  --  1.1*  --   --   --   EOSABS 0.0  --  0.0  --   --   --   BASOSABS 0.0  --  0.0  --   --   --     Chemistries   Recent Labs Lab 10/08/16 1240  10/08/16 2155 10/10/16 1130 10/11/16 0637 10/12/16 0653 10/13/16 1051  NA 139  < > 140 145 145 145 144  K 3.4*  < > 3.6 3.5 3.5 3.3* 3.7  CL 103  < > 107 117* 117* 116* 113*  CO2 23  --  22 21* 21* 21* 23  GLUCOSE 134*  < > 126* 148* 106* 99 123*  BUN 21*  < > 20 12 8 6 7   CREATININE 1.14*  < > 1.00 0.80 0.76 0.88 0.86  CALCIUM 9.4  --  8.4* 8.3* 8.1* 8.3* 8.8*  MG  --   --   --  1.8 1.8 1.8  --   AST 16  --  16  --    --   --   --   ALT 10*  --  9*  --   --   --   --   ALKPHOS 52  --  46  --   --   --   --   BILITOT 2.1*  --  1.9*  --   --   --   --   < > =  values in this interval not displayed. ------------------------------------------------------------------------------------------------------------------ No results for input(s): CHOL, HDL, LDLCALC, TRIG, CHOLHDL, LDLDIRECT in the last 72 hours.  No results found for: HGBA1C ------------------------------------------------------------------------------------------------------------------  Recent Labs  10/11/16 1552  TSH 1.487   ------------------------------------------------------------------------------------------------------------------ No results for input(s): VITAMINB12, FOLATE, FERRITIN, TIBC, IRON, RETICCTPCT in the last 72 hours.  Coagulation profile  Recent Labs Lab 10/08/16 1240 10/08/16 2155  INR 1.12 1.24    No results for input(s): DDIMER in the last 72 hours.  Cardiac Enzymes  Recent Labs Lab 10/08/16 2147 10/09/16 0647  TROPONINI 0.06* 0.06*   ------------------------------------------------------------------------------------------------------------------ No results found for: BNP  Inpatient Medications  Scheduled Meds: . amLODipine  5 mg Oral Daily  . apixaban  2.5 mg Oral BID  . aspirin EC  81 mg Oral Daily  . atorvastatin  20 mg Oral QHS  . diltiazem  60 mg Oral Q6H  . fentaNYL  50 mcg Transdermal Q72H  . fludrocortisone  0.2 mg Oral Daily  . gabapentin  300 mg Oral TID  . levofloxacin  750 mg Oral Q48H  . polyethylene glycol  17 g Oral Daily  . senna-docusate  2 tablet Oral Daily  . sodium chloride flush  3 mL Intravenous Q12H  . sulfacetamide  1 drop Right Eye Q4H  . temazepam  30 mg Oral QHS  . vancomycin  1,000 mg Intravenous Once   Continuous Infusions: . sodium chloride 100 mL/hr at 10/13/16 1010   PRN Meds:.acetaminophen, bisacodyl, levalbuterol, linaclotide, LORazepam, metoprolol,  ondansetron **OR** ondansetron (ZOFRAN) IV  Micro Results Recent Results (from the past 240 hour(s))  Blood Culture (routine x 2)     Status: None   Collection Time: 10/08/16 12:44 PM  Result Value Ref Range Status   Specimen Description BLOOD LEFT FOREARM  Final   Special Requests BOTTLES DRAWN AEROBIC AND ANAEROBIC 5CC  Final   Culture NO GROWTH 5 DAYS  Final   Report Status 10/13/2016 FINAL  Final  Blood Culture (routine x 2)     Status: Abnormal   Collection Time: 10/08/16 12:45 PM  Result Value Ref Range Status   Specimen Description BLOOD RIGHT FOREARM  Final   Special Requests BOTTLES DRAWN AEROBIC AND ANAEROBIC 5CC  Final   Culture  Setup Time   Final    GRAM POSITIVE COCCI IN CLUSTERS Organism ID to follow CRITICAL RESULT CALLED TO, READ BACK BY AND VERIFIED WITH: AEdwina Barth 10:20 10/09/16 ([email or web address])    Culture (A)  Final    STAPHYLOCOCCUS SPECIES (COAGULASE NEGATIVE) THE SIGNIFICANCE OF ISOLATING THIS ORGANISM FROM A SINGLE SET OF BLOOD CULTURES WHEN MULTIPLE SETS ARE DRAWN IS UNCERTAIN. PLEASE NOTIFY THE MICROBIOLOGY DEPARTMENT WITHIN ONE WEEK IF SPECIATION AND SENSITIVITIES ARE REQUIRED.    Report Status 10/11/2016 FINAL  Final  Blood Culture ID Panel (Reflexed)     Status: Abnormal   Collection Time: 10/08/16 12:45 PM  Result Value Ref Range Status   Enterococcus species NOT DETECTED NOT DETECTED Final   Listeria monocytogenes NOT DETECTED NOT DETECTED Final   Staphylococcus species DETECTED (A) NOT DETECTED Final    Comment: CRITICAL RESULT CALLED TO, READ BACK BY AND VERIFIED WITH: AElta [place].D. 10:20 10/09/16 ([email or web address])    Staphylococcus aureus NOT DETECTED NOT DETECTED Final   Methicillin resistance DETECTED (A) NOT DETECTED Final    Comment: CRITICAL RESULT CALLED TO, READ BACK BY AND VERIFIED WITH: AElta [place].D. 10:20 10/09/16 ([email or web address])    Streptococcus species NOT DETECTED NOT DETECTED Final   Streptococcus  agalactiae NOT DETECTED NOT DETECTED Final    Streptococcus pneumoniae NOT DETECTED NOT DETECTED Final   Streptococcus pyogenes NOT DETECTED NOT DETECTED Final   Acinetobacter baumannii NOT DETECTED NOT DETECTED Final   Enterobacteriaceae species NOT DETECTED NOT DETECTED Final   Enterobacter cloacae complex NOT DETECTED NOT DETECTED Final   Escherichia coli NOT DETECTED NOT DETECTED Final   Klebsiella oxytoca NOT DETECTED NOT DETECTED Final   Klebsiella pneumoniae NOT DETECTED NOT DETECTED Final   Proteus species NOT DETECTED NOT DETECTED Final   Serratia marcescens NOT DETECTED NOT DETECTED Final   Haemophilus influenzae NOT DETECTED NOT DETECTED Final   Neisseria meningitidis NOT DETECTED NOT DETECTED Final   Pseudomonas aeruginosa NOT DETECTED NOT DETECTED Final   Candida albicans NOT DETECTED NOT DETECTED Final   Candida glabrata NOT DETECTED NOT DETECTED Final   Candida krusei NOT DETECTED NOT DETECTED Final   Candida parapsilosis NOT DETECTED NOT DETECTED Final   Candida tropicalis NOT DETECTED NOT DETECTED Final  Urine culture     Status: None   Collection Time: 10/08/16  4:39 PM  Result Value Ref Range Status   Specimen Description URINE, CATHETERIZED  Final   Special Requests NONE  Final   Culture NO GROWTH  Final   Report Status 10/09/2016 FINAL  Final    Radiology Reports Dg Chest 1 View  Result Date: 10/08/2016 CLINICAL DATA:  [place] while at home today.  LEFT wrist pain. EXAM: CHEST 1 VIEW COMPARISON:  Chest radiograph October 08, 2016 at 1031 hours FINDINGS: Cardiac silhouette is mildly enlarged unchanged. Status post median sternotomy for CABG. Mildly calcified aortic knob. Similar strandy densities projecting LEFT lung base without pleural effusion or focal consolidation. No pneumothorax. Osteopenia. Soft tissue planes are nonsuspicious; calcifications in neck are likely vascular IMPRESSION: Similar LEFT lung base atelectasis/ scarring. Mild cardiomegaly. Electronically Signed   By: Elon Alas M.D.   On:  10/08/2016 17:13   Dg Chest 2 View  Result Date: 10/08/2016 CLINICAL DATA:  Low grade temperature, possible infection EXAM: CHEST  2 VIEW COMPARISON:  07/09/2016 FINDINGS: Cardiomediastinal silhouette is stable. There is streaky left base retrocardiac atelectasis or early infiltrate. No pulmonary edema. Osteopenia and degenerative changes thoracic spine. IMPRESSION: Streaky left base retrocardiac atelectasis or early infiltrate. No pulmonary edema. Electronically Signed   By: Lahoma Crocker M.D.   On: 10/08/2016 11:46   Dg Wrist 2 Views Left  Result Date: 10/08/2016 CLINICAL DATA:  [place] while at home today.  LEFT wrist pain. EXAM: LEFT WRIST - 2 VIEW COMPARISON:  None. FINDINGS: No acute fracture deformity or dislocation. Osteopenia without destructive bony lesions. Moderate to severe first carpometacarpal joint space narrowing, periarticular sclerosis and marginal spurring compatible with osteoarthrosis. Faint intra-articular calcifications compatible with CPPD. Dorsal wrist soft tissue swelling without subcutaneous gas or radiopaque foreign bodies. IMPRESSION: Soft tissue swelling without acute fracture deformity or dislocation. CPPD. Moderate to severe first carpometacarpal osteoarthrosis. Electronically Signed   By: Elon Alas M.D.   On: 10/08/2016 17:15   Ct Head Wo Contrast  Result Date: 10/08/2016 CLINICAL DATA:  Left facial droop. Left-sided weakness beginning last night. EXAM: CT HEAD WITHOUT CONTRAST TECHNIQUE: Contiguous axial images were obtained from the base of the skull through the vertex without intravenous contrast. COMPARISON:  07/09/2016. FINDINGS: Brain: There is atrophy and chronic small vessel disease changes. No acute intracranial abnormality. Specifically, no hemorrhage, hydrocephalus, mass lesion, acute infarction, or significant intracranial injury. Vascular: No hyperdense vessel or unexpected calcification. Skull: No acute calvarial  abnormality. Sinuses/Orbits: Visualized  paranasal sinuses and mastoids clear. Orbital soft tissues unremarkable. Other: None IMPRESSION: No acute intracranial abnormality. Atrophy, chronic microvascular disease. Electronically Signed   By: Rolm Baptise M.D.   On: 10/08/2016 11:35   Ct Angio Chest Pe W Or Wo Contrast  Result Date: 10/11/2016 CLINICAL DATA:  Shortness of breath and sinus tachycardia. Clinical suspicion for pulmonary embolism. EXAM: CT ANGIOGRAPHY CHEST WITH CONTRAST TECHNIQUE: Multidetector CT imaging of the chest was performed using the standard protocol during bolus administration of intravenous contrast. Multiplanar CT image reconstructions and MIPs were obtained to evaluate the vascular anatomy. CONTRAST:  100 mL Isovue 370 COMPARISON:  None. FINDINGS: Cardiovascular: Satisfactory opacification of pulmonary arteries noted, and no pulmonary emboli identified. No evidence of thoracic aortic dissection or aneurysm. Aortic atherosclerosis. Previous coronary artery bypass grafting. Mild cardiomegaly. Left ventricular hypertrophy. 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 Mediastinum/Nodes: No pathologically enlarged lymph nodes within the thorax. A low-attenuation nodule is seen in the left thyroid lobe measuring 2.4 x 2.8 cm. This shows nonspecific features. Lungs/Pleura: Moderate bilateral pleural effusions are seen with mild dependent atelectasis. No evidence of pulmonary consolidation or mass. 4 mm pulmonary nodule seen in anterior left lower lobe on image 87/407. Upper abdomen: Small hiatal hernia. Musculoskeletal: No suspicious bone lesions or other significant abnormality identified. Review of the MIP images confirms the above findings. IMPRESSION: No evidence of pulmonary embolism. Moderate bilateral pleural effusions and dependent atelectasis. 4 mm indeterminate left lower lobe pulmonary nodule. No follow-up needed if patient is low-risk. Non-contrast chest CT can be  considered in 12 months if patient is high-risk. This recommendation follows the consensus statement: Guidelines for Management of Incidental Pulmonary Nodules Detected on CT Images: From the Fleischner Society 2017; Radiology 2017; 284:228-243. 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 2.8 cm left thyroid lobe nodule. Thyroid ultrasound recommended for further evaluation. This follows ACR consensus guidelines: Managing Incidental Thyroid Nodules Detected on Imaging: White Paper of the ACR Incidental Thyroid Findings Committee. J Am Coll Radiol 2015; 12:143-150. Small hiatal hernia. Electronically Signed   By: Earle Gell M.D.   On: 10/11/2016 17:04   Mr Brain Wo Contrast  Result Date: 10/08/2016 CLINICAL DATA:  Encephalopathy, weakness and altered mental status. History of hypertension, hyperlipidemia. EXAM: MRI HEAD WITHOUT CONTRAST TECHNIQUE: Multiplanar, multiecho pulse sequences of the brain and surrounding structures were obtained without intravenous contrast. COMPARISON:  CT HEAD October 08, 2016 at 1118 hours and MRI of the head July 10, 2016 FINDINGS: Multiple sequences are moderately motion degraded. BRAIN: Faint reduced diffusion LEFT temporal lobe associated with confluent new FLAIR white matter T2 hyperintensities, no focal atrophy. Normalized ADC values. No susceptibility artifact to suggest hemorrhage. Old bilateral small cerebellar infarcts. Old RIGHT posterior insula infarct. Patchy to confluent supratentorial white matter FLAIR T2 hyperintensities ventricles and sulci are overall normal for patient's age. No midline shift, mass effect or abnormal extra-axial fluid collections. VASCULAR: Normal major intracranial vascular flow voids present at skull base. SKULL AND UPPER CERVICAL SPINE: No abnormal sellar expansion. No suspicious calvarial bone marrow signal. Craniocervical junction maintained. Subcentimeter probable pars intermedius cyst.  SINUSES/ORBITS: RIGHT mastoid effusion. Trace paranasal sinus mucosal thickening. Status post LEFT ocular lens implant. The included ocular globes and orbital contents are non-suspicious. OTHER: None. IMPRESSION: No acute intracranial process on this motion degraded examination. LEFT temporal lobe subacute infarct, less likely infiltrative tumor. Recommend 1 month follow-up. Stable moderate to severe  chronic small vessel ischemic disease, old small RIGHT MCA territory infarct and old cerebellar infarcts. Electronically Signed   By: Elon Alas M.D.   On: 10/08/2016 19:59   Ct Abdomen Pelvis W Contrast  Result Date: 10/08/2016 CLINICAL DATA:  Gallbladder disease. EXAM: CT ABDOMEN AND PELVIS WITH CONTRAST TECHNIQUE: Multidetector CT imaging of the abdomen and pelvis was performed using the standard protocol following bolus administration of intravenous contrast. CONTRAST:  1 ISOVUE-300 IOPAMIDOL (ISOVUE-300) INJECTION 61% COMPARISON:  CT scan of January 08, 2015 and August 20, 2012. FINDINGS: Lower chest: No acute abnormality. Hepatobiliary: Distended gallbladder is noted with cholelithiasis. No focal parenchymal abnormality is noted in the liver. Mild intrahepatic and extrahepatic biliary dilatation is noted which was present on prior exam. Pancreas: 5.4 x 4.5 cm multi-cystic mass arises from the pancreatic head. Spleen: Normal in size without focal abnormality. Adrenals/Urinary Tract: Status post right nephrectomy. Adrenal glands appear normal. Stable left renal cyst is noted. No hydronephrosis or renal obstruction is noted. Urinary bladder appears normal. Stomach/Bowel: There is no evidence of bowel obstruction. Vascular/Lymphatic: Aortic atherosclerosis. No enlarged abdominal or pelvic lymph nodes. Reproductive: Status post hysterectomy. No adnexal masses. Other: No abdominal wall hernia or abnormality. No abdominopelvic ascites. Musculoskeletal: No acute or significant osseous findings. IMPRESSION:  Distended gallbladder were cholelithiasis, but no evidence of cholecystitis. Mild intrahepatic and extrahepatic biliary dilatation is noted which was present on prior exam. Correlation with liver function tests is recommended to rule out biliary obstruction. Status post right nephrectomy. Aortic atherosclerosis. 5.4 x 4.5 cm multi-cystic mass seen in pancreatic head. This is concerning for either serous cystadenoma or possibly mucinous cystic neoplasm. Further evaluation with endoscopic ultrasound and cyst aspiration should be considered, if not already performed. Electronically Signed   By: Marijo Conception, M.D.   On: 10/08/2016 15:24   US Renal  Result Date: 10/13/2016 CLINICAL DATA:  Oliguria EXAM: RENAL / URINARY TRACT ULTRASOUND COMPLETE COMPARISON:  CT scan January 2018 FINDINGS: Right Kidney: Surgically absent Left Kidney: Length: 9.7 cm. 1.9 cm cysts. No acute abnormalities. No hydronephrosis. Bladder: Appears normal for degree of bladder distention. IMPRESSION: 1. Right nephrectomy. 2. 1.9 cm left renal cyst.  No acute left renal abnormality. 3. Right pleural effusion. 4. Stones and sludge in the gallbladder. Electronically Signed   By: Dorise Bullion III M.D   On: 10/13/2016 09:58   US Thyroid  Result Date: 10/12/2016 CLINICAL DATA:  Incidental on CT.  Left thyroid nodule. EXAM: THYROID ULTRASOUND TECHNIQUE: Ultrasound examination of the thyroid gland and adjacent soft tissues was performed. COMPARISON:  None. FINDINGS: Parenchymal Echotexture: Mildly heterogenous Isthmus: 0.6 cm. Right lobe: 3.6 x 1.6 x 2.1 cm. Left lobe: 4.6 x 3.1 x 3.2 cm. _________________________________________________________ Estimated total number of nodules >/= 1 cm: 5 Number of spongiform nodules >/=  2 cm not described below (TR1): 0 Number of mixed cystic and solid nodules >/= 1.5 cm not described below (TR2): 0 Nodule # 1: Location: Right; Mid Maximum size: 1.6 cm; Other 2 dimensions: 1.5 x 1.2 cm Composition:  solid/almost completely solid (2) Echogenicity: isoechoic (1) Shape: taller-than-wide (3) Margins: smooth (0) Echogenic foci: none (0) ACR TI-RADS total points: 6. ACR TI-RADS risk category: TR4 (4-6 points). ACR TI-RADS recommendations: **Given size (>/= 1.5 cm) and appearance, fine needle aspiration of this moderately suspicious nodule should be considered based on TI-RADS criteria. Nodule # 2: Location: Right; Inferior Maximum size: 1.4 cm; Other 2 dimensions: 0.8 x 1.0 cm Composition: solid/almost completely solid (2) Echogenicity:  hypoechoic (2) Shape: not taller-than-wide (0) Margins: smooth (0) Echogenic foci: none (0) ACR TI-RADS total points: 4. ACR TI-RADS risk category: TR4 (4-6 points). ACR TI-RADS recommendations: *Given size (>/= 1 - 1.4 cm) and appearance, a follow-up ultrasound in 1 year should be considered based on TI-RADS criteria. Nodule # 3: Location: Left; Superior Maximum size: 2.8 cm; Other 2 dimensions: 2.0 x 2.4 cm Composition: solid/almost completely solid (2) Echogenicity: isoechoic (1) Shape: not taller-than-wide (0) Margins: smooth (0) Echogenic foci: none (0) ACR TI-RADS total points: 3. ACR TI-RADS risk category: TR3 (3 points). ACR TI-RADS recommendations: **Given size (>/= 2.5 cm) and appearance, fine needle aspiration of this mildly suspicious nodule should be considered based on TI-RADS criteria. Other scattered nodules are smaller and have a benign appearance. IMPRESSION: Right and left dominant nodules meet criteria for fine needle aspiration biopsy. 1.4 cm right lower pole nodule meets criteria for annual follow-up. The above is in keeping with the ACR TI-RADS recommendations - J Am Coll Radiol 2017;14:587-595. Electronically Signed   By: Marybelle Killings M.D.   On: 10/12/2016 11:17    Time Spent in minutes  25   Louellen Molder M.D on 10/13/2016 at 3:10 PM  Between 7am to 7pm - Pager - [phone]  After 7pm go to [email or web address] - password [email or web address]  [facility] -   Office  [phone]

## 2016-10-13 NOTE — Progress Notes (Signed)
Pt did not urinate throughout the night- pt bladder scanned 3 times, too low for in and out cath. Documented in chart.

## 2016-10-13 NOTE — Progress Notes (Signed)
Report given to Rn on 4E.

## 2016-10-14 ENCOUNTER — Inpatient Hospital Stay (HOSPITAL_COMMUNITY): Payer: PPO

## 2016-10-14 DIAGNOSIS — R1030 Lower abdominal pain, unspecified: Secondary | ICD-10-CM

## 2016-10-14 LAB — BASIC METABOLIC PANEL
Anion gap: 8 (ref 5–15)
BUN: 9 mg/dL (ref 6–20)
CALCIUM: 8.2 mg/dL — AB (ref 8.9–10.3)
CO2: 22 mmol/L (ref 22–32)
Chloride: 113 mmol/L — ABNORMAL HIGH (ref 101–111)
Creatinine, Ser: 0.85 mg/dL (ref 0.44–1.00)
GFR calc Af Amer: >60 mL/min (ref >60)
GFR calc non Af Amer: >60 mL/min (ref >60)
GLUCOSE: 119 mg/dL — AB (ref 65–99)
Potassium: 3.5 mmol/L (ref 3.5–5.1)
Sodium: 143 mmol/L (ref 135–145)

## 2016-10-14 LAB — MAGNESIUM: Magnesium: 1.9 mg/dL (ref 1.7–2.4)

## 2016-10-14 LAB — MRSA PCR SCREENING: MRSA by PCR: POSITIVE — AB

## 2016-10-14 LAB — URINE CULTURE

## 2016-10-14 MED ORDER — KETOROLAC TROMETHAMINE 15 MG/ML IJ SOLN
15.0000 mg | Freq: Once | INTRAMUSCULAR | Status: AC
  Administered 2016-10-14: 15 mg via INTRAVENOUS
  Filled 2016-10-14: qty 1

## 2016-10-14 MED ORDER — POTASSIUM CHLORIDE CRYS ER 20 MEQ PO TBCR
40.0000 meq | EXTENDED_RELEASE_TABLET | Freq: Once | ORAL | Status: AC
  Administered 2016-10-14: 40 meq via ORAL
  Filled 2016-10-14: qty 2

## 2016-10-14 MED ORDER — CHLORHEXIDINE GLUCONATE CLOTH 2 % EX PADS
6.0000 | MEDICATED_PAD | Freq: Every day | CUTANEOUS | Status: AC
  Administered 2016-10-15 – 2016-10-19 (×4): 6 via TOPICAL

## 2016-10-14 MED ORDER — MAGNESIUM SULFATE 2 GM/50ML IV SOLN
2.0000 g | Freq: Once | INTRAVENOUS | Status: AC
  Administered 2016-10-14: 2 g via INTRAVENOUS
  Filled 2016-10-14: qty 50

## 2016-10-14 MED ORDER — MUPIROCIN 2 % EX OINT
1.0000 "application " | TOPICAL_OINTMENT | Freq: Two times a day (BID) | CUTANEOUS | Status: AC
  Administered 2016-10-14 – 2016-10-18 (×10): 1 via NASAL
  Filled 2016-10-14 (×2): qty 22

## 2016-10-14 NOTE — Progress Notes (Signed)
PROGRESS NOTE                                                                                                                                                                                                             Patient Demographics:    Amanda Hubbard, is a 83 y.o. female, DOB - 1928/01/29, CU:[id number]  Admit date - 10/08/2016   Admitting Physician Waldemar Dickens, MD  Outpatient Primary MD for the patient is Redge Gainer, MD  LOS - 6  Outpatient Specialists: NONE  Chief Complaint  Patient presents with  . Cerebrovascular Accident       Brief Narrative   83 year old female with history of CVA on October 2017 (hospitalized in [place]) with residual speech impairment, chronically bedbound, coronary artery disease status post CABG in 2002, hypertension, dyslipidemia brought to the ED with worsening speech despite ongoing speech therapy and altered mental status. As per daughter she was acting differently over the past few days. Husband also noted some left-sided facial droop and complained of left wrist pain. Initially in the ED course stroke was called but head CT was negative and no residual weakness was noted. As per husband she was diagnosed with cholecystitis 2 months back and was awaiting outpatient surgical follow-up. In the ED she had fever of 100.61F,  tachycardic with heart rate in the veins of 105-141, tachypneic meeting criteria for sepsis. Chest x-ray showed retrocardiac opacity. Blood work showed elevated white count of about 20 4K and mild hypokalemia, mild renal insufficiency and elevated lactic acid of 2. Code sepsis was initiated and patient given IV fluid bolus along with vancomycin and Levaquin EKG showed anterolateral T-wave inversion and troponin of 0.04. Admitted to telemetry for sepsis secondary to pneumonia.   Subjective:   Transfer to stepdown unit on Cardizem drip for rapid A. fib. Heart rate  better controlled. Complains of mid to lower left abdominal pain. Has been constipated for past 4-5 days.   Assessment  & Plan :    Principal Problem:   Sepsis ([place]) Secondary to lobar pneumonia. Sepsis resolved with hydration and empiric antibiotic. Leukocytosis improved. Now in rapid A. fib. Blood cultures 1/2 growing coag-negative staph (suspect contamination).  Discontinue antibiotics. (Completed 7 day course)    Active Problems: A. fib with RVR New onset A. fib. Started on Cardizem drip and transfer to  stepdown unit.  Patient's CHADS2Vasc is 6. After discussing different options, risk versus benefit with daughter patient started on on eliquis.  TSH and free T4 normal. 2-D echo shows EF of 55% with elevated ventricular end diastolic filling pressure and left atrial filling pressure.  Small area of apical akinesis seen.  Abdominal pain Suspect due to constipation. Will obtain abdominal x-ray. Toradol when necessary. Minimal improvement with enema given today. Will reorder enema if x-ray without acute findings.  Thyroid nodule Incidentally seen on CT angiogram. Thyroid function normal. Thyroid ultrasound showed dominant nodules, recommends FNA. Would not plan on biopsy at present given acute illness and can be be done as outpt if pt stable.Daughter informed and she agrees.     Metabolic encephalopathy Likely associated with sepsis. Has speech impairment with stroke a few months back. MRI brain shows subacute infarct recommend follow-up MRI in one month.  Per family she is back to her baseline.  History of stroke with speech impairment Continue aspirin and statin. Patient bedbound at home. Will benefit from anticoagulation. Will discuss with family.  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 Was seen by [facility] in past and recommended no future follow-up. Patient does not have any abdominal pain or distention at present. Will recommend outpatient GI evaluation if needed .   left  wrist pain Had a small hematoma on presentation. X-ray negative for fracture showed severe osteoarthritis  CAD status post CABG Stable. Continue home medications    Type 2 diabetes mellitus, controlled ([place]) Stable. A1c of 6.5. Monitor on sliding sale coverage.    Hypokalemia and hypomagnesemia Replenished  Positive blood culture 1/2 culture on admission growing quite-negative staph. Likely contaminant.    Code Status : DO NOT RESUSCITATE  Family Communication  : None at bedside  Disposition Plan  : Home pending clinical improvement  Barriers For Discharge : Rapid A. fib,   Consults  :  None  Procedures  :  MRI brain 2-D echo  CT angiogram of the chest Renal ultrasound  DVT Prophylaxis  :  Lovenox -  Lab Results  Component Value Date   PLT 302 10/12/2016    Antibiotics  :   Anti-infectives    Start     Dose/Rate Route Frequency Ordered Stop   10/11/16 1530  levofloxacin (LEVAQUIN) tablet 750 mg     750 mg Oral Every 48 hours 10/11/16 1515     10/10/16 1445  aztreonam (AZACTAM) 1 g in dextrose 5 % 50 mL IVPB  Status:  Discontinued     1 g 100 mL/hr over 30 Minutes Intravenous Every 8 hours 10/10/16 1430 10/11/16 1515   10/10/16 1300  levofloxacin (LEVAQUIN) IVPB 750 mg  Status:  Discontinued     750 mg 100 mL/hr over 90 Minutes Intravenous Every 48 hours 10/08/16 1405 10/10/16 1358   10/09/16 1300  vancomycin (VANCOCIN) 500 mg in sodium chloride 0.9 % 100 mL IVPB  Status:  Discontinued     500 mg 100 mL/hr over 60 Minutes Intravenous Every 24 hours 10/08/16 1405 10/11/16 1515   10/08/16 1645  levofloxacin (LEVAQUIN) IVPB 750 mg  Status:  Discontinued     750 mg 100 mL/hr over 90 Minutes Intravenous  Once 10/08/16 1633 10/09/16 1307   10/08/16 1645  vancomycin (VANCOCIN) IVPB 1000 mg/200 mL premix  Status:  Discontinued     1,000 mg 200 mL/hr over 60 Minutes Intravenous  Once 10/08/16 1633 10/14/16 1234   10/08/16 1245  levofloxacin (LEVAQUIN) IVPB 750 mg  750 mg 100 mL/hr over 90 Minutes Intravenous  Once 10/08/16 1233 10/08/16 1512   10/08/16 1245  vancomycin (VANCOCIN) IVPB 1000 mg/200 mL premix     1,000 mg 200 mL/hr over 60 Minutes Intravenous  Once 10/08/16 1233 10/08/16 1513        Objective:   Vitals:   10/14/16 0330 10/14/16 0500 10/14/16 0700 10/14/16 1207  BP: 128/78  (!) 126/56 (!) 146/73  Pulse: 98  90 95  Resp: (!) 21  (!) 23 (!) 22  Temp: 98 F (36.7 C)  97.8 F (36.6 C) 97.9 F (36.6 C)  TempSrc: Oral  Oral Oral  SpO2: 96%  96% 96%  Weight:  59 kg (130 lb 1.1 oz)    Height:        Wt Readings from Last 3 Encounters:  10/14/16 59 kg (130 lb 1.1 oz)  06/05/16 49 kg (108 lb)  08/29/15 45.8 kg (101 lb)     Intake/Output Summary (Last 24 hours) at 10/14/16 1255 Last data filed at 10/13/16 2102  Gross per 24 hour  Intake               25 ml  Output                0 ml  Net               25 ml     Physical Exam  Gen: not in distress,  HEENT:  moist mucosa, supple neck Chest:Clear bilaterally CVS: S1 and S2 irregular, no murmurs rub or gallop GI: soft, Undistended, minimal tenderness in the mid to lower abdomen Musculoskeletal: warm, no edema CNS: Alert and awake, oriented to place and person, slurred speech    Data Review:    CBC  Recent Labs Lab 10/08/16 1240 10/08/16 1300 10/08/16 2155 10/10/16 1130 10/11/16 0637 10/12/16 0653  WBC 23.9*  --  16.2* 19.9* 12.0* 11.4*  HGB 13.0 13.3 11.4* 10.9* 9.9* 9.9*  HCT 39.2 39.0 34.9* 33.7* 30.5* 29.9*  PLT 266  --  246 278 278 302  MCV 99.2  --  98.9 98.5 98.7 98.0  MCH 32.9  --  32.3 31.9 32.0 32.5  MCHC 33.2  --  32.7 32.3 32.5 33.1  RDW 14.3  --  14.8 14.4 14.5 14.8  LYMPHSABS 1.0  --  1.4  --   --   --   MONOABS 1.1*  --  1.1*  --   --   --   EOSABS 0.0  --  0.0  --   --   --   BASOSABS 0.0  --  0.0  --   --   --     Chemistries   Recent Labs Lab 10/08/16 1240  10/08/16 2155 10/10/16 1130 10/11/16 0637 10/12/16 0653  10/13/16 1051 10/14/16 0226  NA 139  < > 140 145 145 145 144 143  K 3.4*  < > 3.6 3.5 3.5 3.3* 3.7 3.5  CL 103  < > 107 117* 117* 116* 113* 113*  CO2 23  --  22 21* 21* 21* 23 22  GLUCOSE 134*  < > 126* 148* 106* 99 123* 119*  BUN 21*  < > 20 12 8 6 7 9   CREATININE 1.14*  < > 1.00 0.80 0.76 0.88 0.86 0.85  CALCIUM 9.4  --  8.4* 8.3* 8.1* 8.3* 8.8* 8.2*  MG  --   --   --  1.8 1.8 1.8  --  1.9  AST 16  --  16  --   --   --   --   --   ALT 10*  --  9*  --   --   --   --   --   ALKPHOS 52  --  46  --   --   --   --   --   BILITOT 2.1*  --  1.9*  --   --   --   --   --   < > = values in this interval not displayed. ------------------------------------------------------------------------------------------------------------------ No results for input(s): CHOL, HDL, LDLCALC, TRIG, CHOLHDL, LDLDIRECT in the last 72 hours.  No results found for: HGBA1C ------------------------------------------------------------------------------------------------------------------  Recent Labs  10/11/16 1552  TSH 1.487   ------------------------------------------------------------------------------------------------------------------ No results for input(s): VITAMINB12, FOLATE, FERRITIN, TIBC, IRON, RETICCTPCT in the last 72 hours.  Coagulation profile  Recent Labs Lab 10/08/16 1240 10/08/16 2155  INR 1.12 1.24    No results for input(s): DDIMER in the last 72 hours.  Cardiac Enzymes  Recent Labs Lab 10/08/16 2147 10/09/16 0647  TROPONINI 0.06* 0.06*   ------------------------------------------------------------------------------------------------------------------ No results found for: BNP  Inpatient Medications  Scheduled Meds: . amLODipine  5 mg Oral Daily  . apixaban  2.5 mg Oral BID  . aspirin EC  81 mg Oral Daily  . atorvastatin  20 mg Oral QHS  . fentaNYL  50 mcg Transdermal Q72H  . gabapentin  300 mg Oral TID  . levofloxacin  750 mg Oral Q48H  . polyethylene glycol  17 g  Oral Daily  . senna-docusate  2 tablet Oral Daily  . sodium chloride flush  3 mL Intravenous Q12H  . sulfacetamide  1 drop Right Eye Q4H  . temazepam  30 mg Oral QHS   Continuous Infusions: . diltiazem (CARDIZEM) infusion 10 mg/hr (10/14/16 1202)   PRN Meds:.acetaminophen, bisacodyl, levalbuterol, linaclotide, LORazepam, ondansetron **OR** ondansetron (ZOFRAN) IV  Micro Results Recent Results (from the past 240 hour(s))  Blood Culture (routine x 2)     Status: None   Collection Time: 10/08/16 12:44 PM  Result Value Ref Range Status   Specimen Description BLOOD LEFT FOREARM  Final   Special Requests BOTTLES DRAWN AEROBIC AND ANAEROBIC 5CC  Final   Culture NO GROWTH 5 DAYS  Final   Report Status 10/13/2016 FINAL  Final  Blood Culture (routine x 2)     Status: Abnormal   Collection Time: 10/08/16 12:45 PM  Result Value Ref Range Status   Specimen Description BLOOD RIGHT FOREARM  Final   Special Requests BOTTLES DRAWN AEROBIC AND ANAEROBIC 5CC  Final   Culture  Setup Time   Final    GRAM POSITIVE COCCI IN CLUSTERS Organism ID to follow CRITICAL RESULT CALLED TO, READ BACK BY AND VERIFIED WITH: AEdwina Barth 10:20 10/09/16 ([email or web address])    Culture (A)  Final    STAPHYLOCOCCUS SPECIES (COAGULASE NEGATIVE) THE SIGNIFICANCE OF ISOLATING THIS ORGANISM FROM A SINGLE SET OF BLOOD CULTURES WHEN MULTIPLE SETS ARE DRAWN IS UNCERTAIN. PLEASE NOTIFY THE MICROBIOLOGY DEPARTMENT WITHIN ONE WEEK IF SPECIATION AND SENSITIVITIES ARE REQUIRED.    Report Status 10/11/2016 FINAL  Final  Blood Culture ID Panel (Reflexed)     Status: Abnormal   Collection Time: 10/08/16 12:45 PM  Result Value Ref Range Status   Enterococcus species NOT DETECTED NOT DETECTED Final   Listeria monocytogenes NOT DETECTED NOT DETECTED Final   Staphylococcus species DETECTED (A) NOT DETECTED Final    Comment: CRITICAL RESULT  CALLED TO, READ BACK BY AND VERIFIED WITH: AElta [place].D. 10:20 10/09/16 ([email or web address])    Staphylococcus  aureus NOT DETECTED NOT DETECTED Final   Methicillin resistance DETECTED (A) NOT DETECTED Final    Comment: CRITICAL RESULT CALLED TO, READ BACK BY AND VERIFIED WITH: AElta [place].D. 10:20 10/09/16 ([email or web address])    Streptococcus species NOT DETECTED NOT DETECTED Final   Streptococcus agalactiae NOT DETECTED NOT DETECTED Final   Streptococcus pneumoniae NOT DETECTED NOT DETECTED Final   Streptococcus pyogenes NOT DETECTED NOT DETECTED Final   Acinetobacter baumannii NOT DETECTED NOT DETECTED Final   Enterobacteriaceae species NOT DETECTED NOT DETECTED Final   Enterobacter cloacae complex NOT DETECTED NOT DETECTED Final   Escherichia coli NOT DETECTED NOT DETECTED Final   Klebsiella oxytoca NOT DETECTED NOT DETECTED Final   Klebsiella pneumoniae NOT DETECTED NOT DETECTED Final   Proteus species NOT DETECTED NOT DETECTED Final   Serratia marcescens NOT DETECTED NOT DETECTED Final   Haemophilus influenzae NOT DETECTED NOT DETECTED Final   Neisseria meningitidis NOT DETECTED NOT DETECTED Final   Pseudomonas aeruginosa NOT DETECTED NOT DETECTED Final   Candida albicans NOT DETECTED NOT DETECTED Final   Candida glabrata NOT DETECTED NOT DETECTED Final   Candida krusei NOT DETECTED NOT DETECTED Final   Candida parapsilosis NOT DETECTED NOT DETECTED Final   Candida tropicalis NOT DETECTED NOT DETECTED Final  Urine culture     Status: None   Collection Time: 10/08/16  4:39 PM  Result Value Ref Range Status   Specimen Description URINE, CATHETERIZED  Final   Special Requests NONE  Final   Culture NO GROWTH  Final   Report Status 10/09/2016 FINAL  Final    Radiology Reports Dg Chest 1 View  Result Date: 10/08/2016 CLINICAL DATA:  [place] while at home today.  LEFT wrist pain. EXAM: CHEST 1 VIEW COMPARISON:  Chest radiograph October 08, 2016 at 1031 hours FINDINGS: Cardiac silhouette is mildly enlarged unchanged. Status post median sternotomy for CABG. Mildly calcified aortic knob. Similar strandy  densities projecting LEFT lung base without pleural effusion or focal consolidation. No pneumothorax. Osteopenia. Soft tissue planes are nonsuspicious; calcifications in neck are likely vascular IMPRESSION: Similar LEFT lung base atelectasis/ scarring. Mild cardiomegaly. Electronically Signed   By: Elon Alas M.D.   On: 10/08/2016 17:13   Dg Chest 2 View  Result Date: 10/08/2016 CLINICAL DATA:  Low grade temperature, possible infection EXAM: CHEST  2 VIEW COMPARISON:  07/09/2016 FINDINGS: Cardiomediastinal silhouette is stable. There is streaky left base retrocardiac atelectasis or early infiltrate. No pulmonary edema. Osteopenia and degenerative changes thoracic spine. IMPRESSION: Streaky left base retrocardiac atelectasis or early infiltrate. No pulmonary edema. Electronically Signed   By: Lahoma Crocker M.D.   On: 10/08/2016 11:46   Dg Wrist 2 Views Left  Result Date: 10/08/2016 CLINICAL DATA:  [place] while at home today.  LEFT wrist pain. EXAM: LEFT WRIST - 2 VIEW COMPARISON:  None. FINDINGS: No acute fracture deformity or dislocation. Osteopenia without destructive bony lesions. Moderate to severe first carpometacarpal joint space narrowing, periarticular sclerosis and marginal spurring compatible with osteoarthrosis. Faint intra-articular calcifications compatible with CPPD. Dorsal wrist soft tissue swelling without subcutaneous gas or radiopaque foreign bodies. IMPRESSION: Soft tissue swelling without acute fracture deformity or dislocation. CPPD. Moderate to severe first carpometacarpal osteoarthrosis. Electronically Signed   By: Elon Alas M.D.   On: 10/08/2016 17:15   Ct Head Wo Contrast  Result Date: 10/08/2016 CLINICAL DATA:  Left facial droop. Left-sided weakness beginning  last night. EXAM: CT HEAD WITHOUT CONTRAST TECHNIQUE: Contiguous axial images were obtained from the base of the skull through the vertex without intravenous contrast. COMPARISON:  07/09/2016. FINDINGS: Brain: There  is atrophy and chronic small vessel disease changes. No acute intracranial abnormality. Specifically, no hemorrhage, hydrocephalus, mass lesion, acute infarction, or significant intracranial injury. Vascular: No hyperdense vessel or unexpected calcification. Skull: No acute calvarial abnormality. Sinuses/Orbits: Visualized paranasal sinuses and mastoids clear. Orbital soft tissues unremarkable. Other: None IMPRESSION: No acute intracranial abnormality. Atrophy, chronic microvascular disease. Electronically Signed   By: Rolm Baptise M.D.   On: 10/08/2016 11:35   Ct Angio Chest Pe W Or Wo Contrast  Result Date: 10/11/2016 CLINICAL DATA:  Shortness of breath and sinus tachycardia. Clinical suspicion for pulmonary embolism. EXAM: CT ANGIOGRAPHY CHEST WITH CONTRAST TECHNIQUE: Multidetector CT imaging of the chest was performed using the standard protocol during bolus administration of intravenous contrast. Multiplanar CT image reconstructions and MIPs were obtained to evaluate the vascular anatomy. CONTRAST:  100 mL Isovue 370 COMPARISON:  None. FINDINGS: Cardiovascular: Satisfactory opacification of pulmonary arteries noted, and no pulmonary emboli identified. No evidence of thoracic aortic dissection or aneurysm. Aortic atherosclerosis. Previous coronary artery bypass grafting. Mild cardiomegaly. Left ventricular hypertrophy. 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 Mediastinum/Nodes: No pathologically enlarged lymph nodes within the thorax. A low-attenuation nodule is seen in the left thyroid lobe measuring 2.4 x 2.8 cm. This shows nonspecific features. Lungs/Pleura: Moderate bilateral pleural effusions are seen with mild dependent atelectasis. No evidence of pulmonary consolidation or mass. 4 mm pulmonary nodule seen in anterior left lower lobe on image 87/407. Upper abdomen: Small hiatal hernia. Musculoskeletal: No suspicious bone lesions or other  significant abnormality identified. Review of the MIP images confirms the above findings. IMPRESSION: No evidence of pulmonary embolism. Moderate bilateral pleural effusions and dependent atelectasis. 4 mm indeterminate left lower lobe pulmonary nodule. No follow-up needed if patient is low-risk. Non-contrast chest CT can be considered in 12 months if patient is high-risk. This recommendation follows the consensus statement: Guidelines for Management of Incidental Pulmonary Nodules Detected on CT Images: From the Fleischner Society 2017; Radiology 2017; 284:228-243. 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 2.8 cm left thyroid lobe nodule. Thyroid ultrasound recommended for further evaluation. This follows ACR consensus guidelines: Managing Incidental Thyroid Nodules Detected on Imaging: White Paper of the ACR Incidental Thyroid Findings Committee. J Am Coll Radiol 2015; 12:143-150. Small hiatal hernia. Electronically Signed   By: Earle Gell M.D.   On: 10/11/2016 17:04   Mr Brain Wo Contrast  Result Date: 10/08/2016 CLINICAL DATA:  Encephalopathy, weakness and altered mental status. History of hypertension, hyperlipidemia. EXAM: MRI HEAD WITHOUT CONTRAST TECHNIQUE: Multiplanar, multiecho pulse sequences of the brain and surrounding structures were obtained without intravenous contrast. COMPARISON:  CT HEAD October 08, 2016 at 1118 hours and MRI of the head July 10, 2016 FINDINGS: Multiple sequences are moderately motion degraded. BRAIN: Faint reduced diffusion LEFT temporal lobe associated with confluent new FLAIR white matter T2 hyperintensities, no focal atrophy. Normalized ADC values. No susceptibility artifact to suggest hemorrhage. Old bilateral small cerebellar infarcts. Old RIGHT posterior insula infarct. Patchy to confluent supratentorial white matter FLAIR T2 hyperintensities ventricles and sulci are overall normal for patient's age. No midline shift,  mass effect or abnormal extra-axial fluid collections. VASCULAR: Normal major intracranial vascular flow voids present at skull base. SKULL AND UPPER CERVICAL SPINE: No abnormal sellar expansion. No  suspicious calvarial bone marrow signal. Craniocervical junction maintained. Subcentimeter probable pars intermedius cyst. SINUSES/ORBITS: RIGHT mastoid effusion. Trace paranasal sinus mucosal thickening. Status post LEFT ocular lens implant. The included ocular globes and orbital contents are non-suspicious. OTHER: None. IMPRESSION: No acute intracranial process on this motion degraded examination. LEFT temporal lobe subacute infarct, less likely infiltrative tumor. Recommend 1 month follow-up. Stable moderate to severe chronic small vessel ischemic disease, old small RIGHT MCA territory infarct and old cerebellar infarcts. Electronically Signed   By: Elon Alas M.D.   On: 10/08/2016 19:59   Ct Abdomen Pelvis W Contrast  Result Date: 10/08/2016 CLINICAL DATA:  Gallbladder disease. EXAM: CT ABDOMEN AND PELVIS WITH CONTRAST TECHNIQUE: Multidetector CT imaging of the abdomen and pelvis was performed using the standard protocol following bolus administration of intravenous contrast. CONTRAST:  1 ISOVUE-300 IOPAMIDOL (ISOVUE-300) INJECTION 61% COMPARISON:  CT scan of January 08, 2015 and August 20, 2012. FINDINGS: Lower chest: No acute abnormality. Hepatobiliary: Distended gallbladder is noted with cholelithiasis. No focal parenchymal abnormality is noted in the liver. Mild intrahepatic and extrahepatic biliary dilatation is noted which was present on prior exam. Pancreas: 5.4 x 4.5 cm multi-cystic mass arises from the pancreatic head. Spleen: Normal in size without focal abnormality. Adrenals/Urinary Tract: Status post right nephrectomy. Adrenal glands appear normal. Stable left renal cyst is noted. No hydronephrosis or renal obstruction is noted. Urinary bladder appears normal. Stomach/Bowel: There is no evidence  of bowel obstruction. Vascular/Lymphatic: Aortic atherosclerosis. No enlarged abdominal or pelvic lymph nodes. Reproductive: Status post hysterectomy. No adnexal masses. Other: No abdominal wall hernia or abnormality. No abdominopelvic ascites. Musculoskeletal: No acute or significant osseous findings. IMPRESSION: Distended gallbladder were cholelithiasis, but no evidence of cholecystitis. Mild intrahepatic and extrahepatic biliary dilatation is noted which was present on prior exam. Correlation with liver function tests is recommended to rule out biliary obstruction. Status post right nephrectomy. Aortic atherosclerosis. 5.4 x 4.5 cm multi-cystic mass seen in pancreatic head. This is concerning for either serous cystadenoma or possibly mucinous cystic neoplasm. Further evaluation with endoscopic ultrasound and cyst aspiration should be considered, if not already performed. Electronically Signed   By: Marijo Conception, M.D.   On: 10/08/2016 15:24   US Renal  Result Date: 10/13/2016 CLINICAL DATA:  Oliguria EXAM: RENAL / URINARY TRACT ULTRASOUND COMPLETE COMPARISON:  CT scan January 2018 FINDINGS: Right Kidney: Surgically absent Left Kidney: Length: 9.7 cm. 1.9 cm cysts. No acute abnormalities. No hydronephrosis. Bladder: Appears normal for degree of bladder distention. IMPRESSION: 1. Right nephrectomy. 2. 1.9 cm left renal cyst.  No acute left renal abnormality. 3. Right pleural effusion. 4. Stones and sludge in the gallbladder. Electronically Signed   By: Dorise Bullion III M.D   On: 10/13/2016 09:58   US Thyroid  Result Date: 10/12/2016 CLINICAL DATA:  Incidental on CT.  Left thyroid nodule. EXAM: THYROID ULTRASOUND TECHNIQUE: Ultrasound examination of the thyroid gland and adjacent soft tissues was performed. COMPARISON:  None. FINDINGS: Parenchymal Echotexture: Mildly heterogenous Isthmus: 0.6 cm. Right lobe: 3.6 x 1.6 x 2.1 cm. Left lobe: 4.6 x 3.1 x 3.2 cm.  _________________________________________________________ Estimated total number of nodules >/= 1 cm: 5 Number of spongiform nodules >/=  2 cm not described below (TR1): 0 Number of mixed cystic and solid nodules >/= 1.5 cm not described below (TR2): 0 Nodule # 1: Location: Right; Mid Maximum size: 1.6 cm; Other 2 dimensions: 1.5 x 1.2 cm Composition: solid/almost completely solid (2) Echogenicity: isoechoic (1) Shape: taller-than-wide (3) Margins:  smooth (0) Echogenic foci: none (0) ACR TI-RADS total points: 6. ACR TI-RADS risk category: TR4 (4-6 points). ACR TI-RADS recommendations: **Given size (>/= 1.5 cm) and appearance, fine needle aspiration of this moderately suspicious nodule should be considered based on TI-RADS criteria. Nodule # 2: Location: Right; Inferior Maximum size: 1.4 cm; Other 2 dimensions: 0.8 x 1.0 cm Composition: solid/almost completely solid (2) Echogenicity: hypoechoic (2) Shape: not taller-than-wide (0) Margins: smooth (0) Echogenic foci: none (0) ACR TI-RADS total points: 4. ACR TI-RADS risk category: TR4 (4-6 points). ACR TI-RADS recommendations: *Given size (>/= 1 - 1.4 cm) and appearance, a follow-up ultrasound in 1 year should be considered based on TI-RADS criteria. Nodule # 3: Location: Left; Superior Maximum size: 2.8 cm; Other 2 dimensions: 2.0 x 2.4 cm Composition: solid/almost completely solid (2) Echogenicity: isoechoic (1) Shape: not taller-than-wide (0) Margins: smooth (0) Echogenic foci: none (0) ACR TI-RADS total points: 3. ACR TI-RADS risk category: TR3 (3 points). ACR TI-RADS recommendations: **Given size (>/= 2.5 cm) and appearance, fine needle aspiration of this mildly suspicious nodule should be considered based on TI-RADS criteria. Other scattered nodules are smaller and have a benign appearance. IMPRESSION: Right and left dominant nodules meet criteria for fine needle aspiration biopsy. 1.4 cm right lower pole nodule meets criteria for annual follow-up. The above is  in keeping with the ACR TI-RADS recommendations - J Am Coll Radiol 2017;14:587-595. Electronically Signed   By: Marybelle Killings M.D.   On: 10/12/2016 11:17    Time Spent in minutes  35   Louellen Molder M.D on 10/14/2016 at 12:55 PM  Between 7am to 7pm - Pager - [phone]  After 7pm go to [email or web address] - password [facility]  [facility] -  Office  [phone]

## 2016-10-15 DIAGNOSIS — K5909 Other constipation: Secondary | ICD-10-CM

## 2016-10-15 LAB — BASIC METABOLIC PANEL
ANION GAP: 5 (ref 5–15)
BUN: 11 mg/dL (ref 6–20)
CO2: 21 mmol/L — ABNORMAL LOW (ref 22–32)
Calcium: 8.2 mg/dL — ABNORMAL LOW (ref 8.9–10.3)
Chloride: 115 mmol/L — ABNORMAL HIGH (ref 101–111)
Creatinine, Ser: 0.96 mg/dL (ref 0.44–1.00)
GFR, EST NON AFRICAN AMERICAN: 52 mL/min — AB (ref >60)
GLUCOSE: 110 mg/dL — AB (ref 65–99)
POTASSIUM: 4.7 mmol/L (ref 3.5–5.1)
SODIUM: 141 mmol/L (ref 135–145)

## 2016-10-15 MED ORDER — SORBITOL 70 % SOLN
960.0000 mL | TOPICAL_OIL | Freq: Once | ORAL | Status: AC
  Administered 2016-10-15: 960 mL via RECTAL
  Filled 2016-10-15: qty 240

## 2016-10-15 NOTE — Care Management Note (Signed)
Case Management Note  Patient Details  Name: Amanda Hubbard MRN: [id number] Date of Birth: 1928-08-11  Subjective/Objective:  Presents with sepsis secondary to pna, afib with rvr, conts on cardizem drip, fetnal patch, plan home with [facility] when stable.                  Action/Plan:   Expected Discharge Date:                  Expected Discharge Plan:  [place]  In-House Referral:     Discharge planning Services  CM Consult  Post Acute Care Choice:  Resumption of Svcs/PTA Provider Choice offered to:  Patient  DME Arranged:    DME Agency:     HH Arranged:  PT, Speech Therapy (active with [facility], RN and SLP) [place] Agency:  [place]  Status of Service:  In process, will continue to follow  If discussed at Long Length of Stay Meetings, dates discussed:    Additional Comments:  Zenon Mayo, RN 10/15/2016, 4:20 PM

## 2016-10-15 NOTE — Progress Notes (Signed)
PROGRESS NOTE                                                                                                                                                                                                             Patient Demographics:    Colena Scheele, is a 83 y.o. female, DOB - Sep 15, 1928, [id number]  Admit date - 10/08/2016   Admitting Physician Waldemar Dickens, MD  Outpatient Primary MD for the patient is Redge Gainer, MD  LOS - 7  Outpatient Specialists: NONE  Chief Complaint  Patient presents with  . Cerebrovascular Accident       Brief Narrative   83 year old female with history of CVA on October 2017 (hospitalized in [place]) with residual speech impairment, chronically bedbound, coronary artery disease status post CABG in 2002, hypertension, dyslipidemia brought to the ED with worsening speech despite ongoing speech therapy and altered mental status. As per daughter she was acting differently over the past few days. Husband also noted some left-sided facial droop and complained of left wrist pain. Initially in the ED course stroke was called but head CT was negative and no residual weakness was noted. As per husband she was diagnosed with cholecystitis 2 months back and was awaiting outpatient surgical follow-up. In the ED she had fever of 100.58F,  tachycardic with heart rate in the veins of 105-141, tachypneic meeting criteria for sepsis. Chest x-ray showed retrocardiac opacity. Blood work showed elevated white count of about 20 4K and mild hypokalemia, mild renal insufficiency and elevated lactic acid of 2. Code sepsis was initiated and patient given IV fluid bolus along with vancomycin and Levaquin EKG showed anterolateral T-wave inversion and troponin of 0.04. Admitted to telemetry for sepsis secondary to pneumonia.   Subjective:   Still in rapid A. fib requiring Cardizem drip.. Better control. Complains of  vague abdominal pain. Did not have good bowel movement despite enema.   Assessment  & Plan :    Principal Problem:     Active Problems: A. fib with RVR New onset A. fib. Started on Cardizem drip and transferred to stepdown. Patient's CHADS2Vasc is 6. After discussing different options, risk versus benefit with daughter patient started on on eliquis.  TSH and free T4 normal. 2-D echo shows EF of 55% with elevated ventricular end diastolic filling pressure and left atrial filling pressure.  Small area of apical akinesis seen. Titrate Cardizem drip as tolerated.     Abdominal pain Suspect due to constipation. Patient frequently complaining of abdominal pain at home as well.  abdominal x-ray unremarkable. Toradol when necessary. Minimal improvement with enema . ordered SMOG enema today.    Sepsis ([place]) Secondary to lobar pneumonia. Resolved. Cultures negative. Completed 7 days antibiotic course.  Thyroid nodule Incidentally seen on CT angiogram. Thyroid function normal. Thyroid ultrasound showed dominant nodules, recommends FNA. Would not plan on biopsy at present given acute illness and can be be done as outpt if pt stable.Daughter informed and she agrees.     Metabolic encephalopathy Likely associated with sepsis. Has speech impairment with stroke a few months back. MRI brain shows subacute infarct recommend follow-up MRI in one month.  Per family she is back to her baseline.  History of stroke with speech impairment Continue aspirin and statin. Patient bedbound at home. Will benefit from anticoagulation. Will discuss with family.  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 Was seen by [facility] in past and recommended no future follow-up. Patient does not have any abdominal pain or distention at present. Will recommend outpatient GI evaluation if needed .   left wrist pain Had a small hematoma on presentation. X-ray negative for fracture showed severe osteoarthritis  CAD status  post CABG Stable. Continue home medications    Type 2 diabetes mellitus, controlled ([place]) Stable. A1c of 6.5. Monitor on sliding sale coverage.    Hypokalemia and hypomagnesemia Replenished  Positive blood culture 1/2 culture on admission growing quite-negative staph. Likely contaminant.    Code Status : DO NOT RESUSCITATE  Family Communication  : None at bedside  Disposition Plan  : Home pending clinical improvement. Palliative Care consult if no clinical improvement in the next 48 hours.  Barriers For Discharge : Rapid A. fib,   Consults  :  None  Procedures  :  MRI brain 2-D echo  CT angiogram of the chest Renal ultrasound  DVT Prophylaxis  :  Lovenox -  Lab Results  Component Value Date   PLT 302 10/12/2016    Antibiotics  :   Anti-infectives    Start     Dose/Rate Route Frequency Ordered Stop   10/11/16 1530  levofloxacin (LEVAQUIN) tablet 750 mg  Status:  Discontinued     750 mg Oral Every 48 hours 10/11/16 1515 10/14/16 1304   10/10/16 1445  aztreonam (AZACTAM) 1 g in dextrose 5 % 50 mL IVPB  Status:  Discontinued     1 g 100 mL/hr over 30 Minutes Intravenous Every 8 hours 10/10/16 1430 10/11/16 1515   10/10/16 1300  levofloxacin (LEVAQUIN) IVPB 750 mg  Status:  Discontinued     750 mg 100 mL/hr over 90 Minutes Intravenous Every 48 hours 10/08/16 1405 10/10/16 1358   10/09/16 1300  vancomycin (VANCOCIN) 500 mg in sodium chloride 0.9 % 100 mL IVPB  Status:  Discontinued     500 mg 100 mL/hr over 60 Minutes Intravenous Every 24 hours 10/08/16 1405 10/11/16 1515   10/08/16 1645  levofloxacin (LEVAQUIN) IVPB 750 mg  Status:  Discontinued     750 mg 100 mL/hr over 90 Minutes Intravenous  Once 10/08/16 1633 10/09/16 1307   10/08/16 1645  vancomycin (VANCOCIN) IVPB 1000 mg/200 mL premix  Status:  Discontinued     1,000 mg 200 mL/hr over 60 Minutes Intravenous  Once 10/08/16 1633 10/14/16 1234   10/08/16 1245  levofloxacin (LEVAQUIN) IVPB 750 mg  750  mg 100 mL/hr over 90 Minutes Intravenous  Once 10/08/16 1233 10/08/16 1512   10/08/16 1245  vancomycin (VANCOCIN) IVPB 1000 mg/200 mL premix     1,000 mg 200 mL/hr over 60 Minutes Intravenous  Once 10/08/16 1233 10/08/16 1513        Objective:   Vitals:   10/15/16 0300 10/15/16 0441 10/15/16 0707 10/15/16 1000  BP: 135/79  (!) 141/61   Pulse: 99  86 85  Resp: 18  16 15   Temp:   97.8 F (36.6 C)   TempSrc:   Oral   SpO2: 94%  95% 95%  Weight:  60.1 kg (132 lb 7.9 oz)    Height:        Wt Readings from Last 3 Encounters:  10/15/16 60.1 kg (132 lb 7.9 oz)  06/05/16 49 kg (108 lb)  08/29/15 45.8 kg (101 lb)     Intake/Output Summary (Last 24 hours) at 10/15/16 1111 Last data filed at 10/15/16 1100  Gross per 24 hour  Intake           691.63 ml  Output                0 ml  Net           691.63 ml     Physical Exam  Gen: not in distress, Thinning of abdominal pain HEENT:  moist mucosa, supple neck Chest:Clear bilaterally CVS: S1 and S2 irregular, no murmurs rub or gallop GI: soft, nondistended, no significant tenderness. Bowel sounds present Musculoskeletal: warm, no edema CNS: Alert and awake, oriented to place and person, slurred speech    Data Review:    CBC  Recent Labs Lab 10/08/16 1240 10/08/16 1300 10/08/16 2155 10/10/16 1130 10/11/16 0637 10/12/16 0653  WBC 23.9*  --  16.2* 19.9* 12.0* 11.4*  HGB 13.0 13.3 11.4* 10.9* 9.9* 9.9*  HCT 39.2 39.0 34.9* 33.7* 30.5* 29.9*  PLT 266  --  246 278 278 302  MCV 99.2  --  98.9 98.5 98.7 98.0  MCH 32.9  --  32.3 31.9 32.0 32.5  MCHC 33.2  --  32.7 32.3 32.5 33.1  RDW 14.3  --  14.8 14.4 14.5 14.8  LYMPHSABS 1.0  --  1.4  --   --   --   MONOABS 1.1*  --  1.1*  --   --   --   EOSABS 0.0  --  0.0  --   --   --   BASOSABS 0.0  --  0.0  --   --   --     Chemistries   Recent Labs Lab 10/08/16 1240  10/08/16 2155 10/10/16 1130 10/11/16 0637 10/12/16 0653 10/13/16 1051 10/14/16 0226 10/15/16 0249   NA 139  < > 140 145 145 145 144 143 141  K 3.4*  < > 3.6 3.5 3.5 3.3* 3.7 3.5 4.7  CL 103  < > 107 117* 117* 116* 113* 113* 115*  CO2 23  --  22 21* 21* 21* 23 22 21*  GLUCOSE 134*  < > 126* 148* 106* 99 123* 119* 110*  BUN 21*  < > 20 12 8 6 7 9 11   CREATININE 1.14*  < > 1.00 0.80 0.76 0.88 0.86 0.85 0.96  CALCIUM 9.4  --  8.4* 8.3* 8.1* 8.3* 8.8* 8.2* 8.2*  MG  --   --   --  1.8 1.8 1.8  --  1.9  --   AST 16  --  16  --   --   --   --   --   --   ALT 10*  --  9*  --   --   --   --   --   --   ALKPHOS 52  --  46  --   --   --   --   --   --   BILITOT 2.1*  --  1.9*  --   --   --   --   --   --   < > = values in this interval not displayed. ------------------------------------------------------------------------------------------------------------------ No results for input(s): CHOL, HDL, LDLCALC, TRIG, CHOLHDL, LDLDIRECT in the last 72 hours.  No results found for: HGBA1C ------------------------------------------------------------------------------------------------------------------ No results for input(s): TSH, T4TOTAL, T3FREE, THYROIDAB in the last 72 hours.  Invalid input(s): FREET3 ------------------------------------------------------------------------------------------------------------------ No results for input(s): VITAMINB12, FOLATE, FERRITIN, TIBC, IRON, RETICCTPCT in the last 72 hours.  Coagulation profile  Recent Labs Lab 10/08/16 1240 10/08/16 2155  INR 1.12 1.24    No results for input(s): DDIMER in the last 72 hours.  Cardiac Enzymes  Recent Labs Lab 10/08/16 2147 10/09/16 0647  TROPONINI 0.06* 0.06*   ------------------------------------------------------------------------------------------------------------------ No results found for: BNP  Inpatient Medications  Scheduled Meds: . amLODipine  5 mg Oral Daily  . apixaban  2.5 mg Oral BID  . aspirin EC  81 mg Oral Daily  . atorvastatin  20 mg Oral QHS  . Chlorhexidine Gluconate Cloth  6 each  Topical Q0600  . fentaNYL  50 mcg Transdermal Q72H  . gabapentin  300 mg Oral TID  . mupirocin ointment  1 application Nasal BID  . polyethylene glycol  17 g Oral Daily  . senna-docusate  2 tablet Oral Daily  . sodium chloride flush  3 mL Intravenous Q12H  . sorbitol, milk of mag, mineral oil, glycerin (SMOG) enema  960 mL Rectal Once  . sulfacetamide  1 drop Right Eye Q4H  . temazepam  30 mg Oral QHS   Continuous Infusions: . diltiazem (CARDIZEM) infusion 10 mg/hr (10/15/16 0644)   PRN Meds:.acetaminophen, bisacodyl, levalbuterol, linaclotide, LORazepam, ondansetron **OR** ondansetron (ZOFRAN) IV  Micro Results Recent Results (from the past 240 hour(s))  Blood Culture (routine x 2)     Status: None   Collection Time: 10/08/16 12:44 PM  Result Value Ref Range Status   Specimen Description BLOOD LEFT FOREARM  Final   Special Requests BOTTLES DRAWN AEROBIC AND ANAEROBIC 5CC  Final   Culture NO GROWTH 5 DAYS  Final   Report Status 10/13/2016 FINAL  Final  Blood Culture (routine x 2)     Status: Abnormal   Collection Time: 10/08/16 12:45 PM  Result Value Ref Range Status   Specimen Description BLOOD RIGHT FOREARM  Final   Special Requests BOTTLES DRAWN AEROBIC AND ANAEROBIC 5CC  Final   Culture  Setup Time   Final    GRAM POSITIVE COCCI IN CLUSTERS Organism ID to follow CRITICAL RESULT CALLED TO, READ BACK BY AND VERIFIED WITH: AEdwina Barth 10:20 10/09/16 ([email or web address])    Culture (A)  Final    STAPHYLOCOCCUS SPECIES (COAGULASE NEGATIVE) THE SIGNIFICANCE OF ISOLATING THIS ORGANISM FROM A SINGLE SET OF BLOOD CULTURES WHEN MULTIPLE SETS ARE DRAWN IS UNCERTAIN. PLEASE NOTIFY THE MICROBIOLOGY DEPARTMENT WITHIN ONE WEEK IF SPECIATION AND SENSITIVITIES ARE REQUIRED.    Report Status 10/11/2016 FINAL  Final  Blood Culture ID Panel (Reflexed)     Status: Abnormal   Collection Time: 10/08/16 12:45  PM  Result Value Ref Range Status   Enterococcus species NOT DETECTED NOT DETECTED Final    Listeria monocytogenes NOT DETECTED NOT DETECTED Final   Staphylococcus species DETECTED (A) NOT DETECTED Final    Comment: CRITICAL RESULT CALLED TO, READ BACK BY AND VERIFIED WITH: AElta [place].D. 10:20 10/09/16 ([email or web address])    Staphylococcus aureus NOT DETECTED NOT DETECTED Final   Methicillin resistance DETECTED (A) NOT DETECTED Final    Comment: CRITICAL RESULT CALLED TO, READ BACK BY AND VERIFIED WITH: AElta [place].D. 10:20 10/09/16 ([email or web address])    Streptococcus species NOT DETECTED NOT DETECTED Final   Streptococcus agalactiae NOT DETECTED NOT DETECTED Final   Streptococcus pneumoniae NOT DETECTED NOT DETECTED Final   Streptococcus pyogenes NOT DETECTED NOT DETECTED Final   Acinetobacter baumannii NOT DETECTED NOT DETECTED Final   Enterobacteriaceae species NOT DETECTED NOT DETECTED Final   Enterobacter cloacae complex NOT DETECTED NOT DETECTED Final   Escherichia coli NOT DETECTED NOT DETECTED Final   Klebsiella oxytoca NOT DETECTED NOT DETECTED Final   Klebsiella pneumoniae NOT DETECTED NOT DETECTED Final   Proteus species NOT DETECTED NOT DETECTED Final   Serratia marcescens NOT DETECTED NOT DETECTED Final   Haemophilus influenzae NOT DETECTED NOT DETECTED Final   Neisseria meningitidis NOT DETECTED NOT DETECTED Final   Pseudomonas aeruginosa NOT DETECTED NOT DETECTED Final   Candida albicans NOT DETECTED NOT DETECTED Final   Candida glabrata NOT DETECTED NOT DETECTED Final   Candida krusei NOT DETECTED NOT DETECTED Final   Candida parapsilosis NOT DETECTED NOT DETECTED Final   Candida tropicalis NOT DETECTED NOT DETECTED Final  Urine culture     Status: None   Collection Time: 10/08/16  4:39 PM  Result Value Ref Range Status   Specimen Description URINE, CATHETERIZED  Final   Special Requests NONE  Final   Culture NO GROWTH  Final   Report Status 10/09/2016 FINAL  Final  Culture, Urine     Status: Abnormal   Collection Time: 10/13/16  4:36 PM  Result Value Ref Range  Status   Specimen Description URINE, CLEAN CATCH  Final   Special Requests NONE  Final   Culture >=100,000 COLONIES/mL YEAST (A)  Final   Report Status 10/14/2016 FINAL  Final  MRSA PCR Screening     Status: Abnormal   Collection Time: 10/14/16 12:07 PM  Result Value Ref Range Status   MRSA by PCR POSITIVE (A) NEGATIVE Final    Comment:        The GeneXpert MRSA Assay (FDA approved for NASAL specimens only), is one component of a comprehensive MRSA colonization surveillance program. It is not intended to diagnose MRSA infection nor to guide or monitor treatment for MRSA infections. RESULT CALLED TO, READ BACK BY AND VERIFIED WITH: S. [facility] RN AT 1346 10/14/16 BY A.DAVIS     Radiology Reports Dg Chest 1 View  Result Date: 10/08/2016 CLINICAL DATA:  [place] while at home today.  LEFT wrist pain. EXAM: CHEST 1 VIEW COMPARISON:  Chest radiograph October 08, 2016 at 1031 hours FINDINGS: Cardiac silhouette is mildly enlarged unchanged. Status post median sternotomy for CABG. Mildly calcified aortic knob. Similar strandy densities projecting LEFT lung base without pleural effusion or focal consolidation. No pneumothorax. Osteopenia. Soft tissue planes are nonsuspicious; calcifications in neck are likely vascular IMPRESSION: Similar LEFT lung base atelectasis/ scarring. Mild cardiomegaly. Electronically Signed   By: Elon Alas M.D.   On: 10/08/2016 17:13   Dg Chest 2 View  Result Date: 10/08/2016  CLINICAL DATA:  Low grade temperature, possible infection EXAM: CHEST  2 VIEW COMPARISON:  07/09/2016 FINDINGS: Cardiomediastinal silhouette is stable. There is streaky left base retrocardiac atelectasis or early infiltrate. No pulmonary edema. Osteopenia and degenerative changes thoracic spine. IMPRESSION: Streaky left base retrocardiac atelectasis or early infiltrate. No pulmonary edema. Electronically Signed   By: Lahoma Crocker M.D.   On: 10/08/2016 11:46   Dg Wrist 2 Views Left  Result Date:  10/08/2016 CLINICAL DATA:  [place] while at home today.  LEFT wrist pain. EXAM: LEFT WRIST - 2 VIEW COMPARISON:  None. FINDINGS: No acute fracture deformity or dislocation. Osteopenia without destructive bony lesions. Moderate to severe first carpometacarpal joint space narrowing, periarticular sclerosis and marginal spurring compatible with osteoarthrosis. Faint intra-articular calcifications compatible with CPPD. Dorsal wrist soft tissue swelling without subcutaneous gas or radiopaque foreign bodies. IMPRESSION: Soft tissue swelling without acute fracture deformity or dislocation. CPPD. Moderate to severe first carpometacarpal osteoarthrosis. Electronically Signed   By: Elon Alas M.D.   On: 10/08/2016 17:15   Ct Head Wo Contrast  Result Date: 10/08/2016 CLINICAL DATA:  Left facial droop. Left-sided weakness beginning last night. EXAM: CT HEAD WITHOUT CONTRAST TECHNIQUE: Contiguous axial images were obtained from the base of the skull through the vertex without intravenous contrast. COMPARISON:  07/09/2016. FINDINGS: Brain: There is atrophy and chronic small vessel disease changes. No acute intracranial abnormality. Specifically, no hemorrhage, hydrocephalus, mass lesion, acute infarction, or significant intracranial injury. Vascular: No hyperdense vessel or unexpected calcification. Skull: No acute calvarial abnormality. Sinuses/Orbits: Visualized paranasal sinuses and mastoids clear. Orbital soft tissues unremarkable. Other: None IMPRESSION: No acute intracranial abnormality. Atrophy, chronic microvascular disease. Electronically Signed   By: Rolm Baptise M.D.   On: 10/08/2016 11:35   Ct Angio Chest Pe W Or Wo Contrast  Result Date: 10/11/2016 CLINICAL DATA:  Shortness of breath and sinus tachycardia. Clinical suspicion for pulmonary embolism. EXAM: CT ANGIOGRAPHY CHEST WITH CONTRAST TECHNIQUE: Multidetector CT imaging of the chest was performed using the standard protocol during bolus administration  of intravenous contrast. Multiplanar CT image reconstructions and MIPs were obtained to evaluate the vascular anatomy. CONTRAST:  100 mL Isovue 370 COMPARISON:  None. FINDINGS: Cardiovascular: Satisfactory opacification of pulmonary arteries noted, and no pulmonary emboli identified. No evidence of thoracic aortic dissection or aneurysm. Aortic atherosclerosis. Previous coronary artery bypass grafting. Mild cardiomegaly. Left ventricular hypertrophy. 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 Mediastinum/Nodes: No pathologically enlarged lymph nodes within the thorax. A low-attenuation nodule is seen in the left thyroid lobe measuring 2.4 x 2.8 cm. This shows nonspecific features. Lungs/Pleura: Moderate bilateral pleural effusions are seen with mild dependent atelectasis. No evidence of pulmonary consolidation or mass. 4 mm pulmonary nodule seen in anterior left lower lobe on image 87/407. Upper abdomen: Small hiatal hernia. Musculoskeletal: No suspicious bone lesions or other significant abnormality identified. Review of the MIP images confirms the above findings. IMPRESSION: No evidence of pulmonary embolism. Moderate bilateral pleural effusions and dependent atelectasis. 4 mm indeterminate left lower lobe pulmonary nodule. No follow-up needed if patient is low-risk. Non-contrast chest CT can be considered in 12 months if patient is high-risk. This recommendation follows the consensus statement: Guidelines for Management of Incidental Pulmonary Nodules Detected on CT Images: From the Fleischner Society 2017; Radiology 2017; 284:228-243. 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 2.8 cm left thyroid lobe nodule. Thyroid ultrasound recommended for further evaluation. This follows ACR consensus guidelines: Managing Incidental  Thyroid Nodules Detected on Imaging: White Paper of the ACR Incidental Thyroid Findings  Committee. J Am Coll Radiol 2015; 12:143-150. Small hiatal hernia. Electronically Signed   By: Earle Gell M.D.   On: 10/11/2016 17:04   Mr Brain Wo Contrast  Result Date: 10/08/2016 CLINICAL DATA:  Encephalopathy, weakness and altered mental status. History of hypertension, hyperlipidemia. EXAM: MRI HEAD WITHOUT CONTRAST TECHNIQUE: Multiplanar, multiecho pulse sequences of the brain and surrounding structures were obtained without intravenous contrast. COMPARISON:  CT HEAD October 08, 2016 at 1118 hours and MRI of the head July 10, 2016 FINDINGS: Multiple sequences are moderately motion degraded. BRAIN: Faint reduced diffusion LEFT temporal lobe associated with confluent new FLAIR white matter T2 hyperintensities, no focal atrophy. Normalized ADC values. No susceptibility artifact to suggest hemorrhage. Old bilateral small cerebellar infarcts. Old RIGHT posterior insula infarct. Patchy to confluent supratentorial white matter FLAIR T2 hyperintensities ventricles and sulci are overall normal for patient's age. No midline shift, mass effect or abnormal extra-axial fluid collections. VASCULAR: Normal major intracranial vascular flow voids present at skull base. SKULL AND UPPER CERVICAL SPINE: No abnormal sellar expansion. No suspicious calvarial bone marrow signal. Craniocervical junction maintained. Subcentimeter probable pars intermedius cyst. SINUSES/ORBITS: RIGHT mastoid effusion. Trace paranasal sinus mucosal thickening. Status post LEFT ocular lens implant. The included ocular globes and orbital contents are non-suspicious. OTHER: None. IMPRESSION: No acute intracranial process on this motion degraded examination. LEFT temporal lobe subacute infarct, less likely infiltrative tumor. Recommend 1 month follow-up. Stable moderate to severe chronic small vessel ischemic disease, old small RIGHT MCA territory infarct and old cerebellar infarcts. Electronically Signed   By: Elon Alas M.D.   On: 10/08/2016  19:59   Ct Abdomen Pelvis W Contrast  Result Date: 10/08/2016 CLINICAL DATA:  Gallbladder disease. EXAM: CT ABDOMEN AND PELVIS WITH CONTRAST TECHNIQUE: Multidetector CT imaging of the abdomen and pelvis was performed using the standard protocol following bolus administration of intravenous contrast. CONTRAST:  1 ISOVUE-300 IOPAMIDOL (ISOVUE-300) INJECTION 61% COMPARISON:  CT scan of January 08, 2015 and August 20, 2012. FINDINGS: Lower chest: No acute abnormality. Hepatobiliary: Distended gallbladder is noted with cholelithiasis. No focal parenchymal abnormality is noted in the liver. Mild intrahepatic and extrahepatic biliary dilatation is noted which was present on prior exam. Pancreas: 5.4 x 4.5 cm multi-cystic mass arises from the pancreatic head. Spleen: Normal in size without focal abnormality. Adrenals/Urinary Tract: Status post right nephrectomy. Adrenal glands appear normal. Stable left renal cyst is noted. No hydronephrosis or renal obstruction is noted. Urinary bladder appears normal. Stomach/Bowel: There is no evidence of bowel obstruction. Vascular/Lymphatic: Aortic atherosclerosis. No enlarged abdominal or pelvic lymph nodes. Reproductive: Status post hysterectomy. No adnexal masses. Other: No abdominal wall hernia or abnormality. No abdominopelvic ascites. Musculoskeletal: No acute or significant osseous findings. IMPRESSION: Distended gallbladder were cholelithiasis, but no evidence of cholecystitis. Mild intrahepatic and extrahepatic biliary dilatation is noted which was present on prior exam. Correlation with liver function tests is recommended to rule out biliary obstruction. Status post right nephrectomy. Aortic atherosclerosis. 5.4 x 4.5 cm multi-cystic mass seen in pancreatic head. This is concerning for either serous cystadenoma or possibly mucinous cystic neoplasm. Further evaluation with endoscopic ultrasound and cyst aspiration should be considered, if not already performed.  Electronically Signed   By: Marijo Conception, M.D.   On: 10/08/2016 15:24   US Renal  Result Date: 10/13/2016 CLINICAL DATA:  Oliguria EXAM: RENAL / URINARY TRACT ULTRASOUND COMPLETE COMPARISON:  CT scan January 2018 FINDINGS: Right  Kidney: Surgically absent Left Kidney: Length: 9.7 cm. 1.9 cm cysts. No acute abnormalities. No hydronephrosis. Bladder: Appears normal for degree of bladder distention. IMPRESSION: 1. Right nephrectomy. 2. 1.9 cm left renal cyst.  No acute left renal abnormality. 3. Right pleural effusion. 4. Stones and sludge in the gallbladder. Electronically Signed   By: Dorise Bullion III M.D   On: 10/13/2016 09:58   Dg Abd 2 Views  Addendum Date: 10/14/2016   ADDENDUM REPORT: 10/14/2016 17:48 ADDENDUM: Patient reportedly has not had a cholecystectomy. The surgical vascular clips in the right quadrant reflect changes from a right nephrectomy. Electronically Signed   By: Lajean Manes M.D.   On: 10/14/2016 17:48   Result Date: 10/14/2016 CLINICAL DATA:  Abdominal pain. EXAM: ABDOMEN - 2 VIEW COMPARISON:  CT, 10/08/2016 FINDINGS: Mild gaseous distention of the right colon, but no evidence of bowel obstruction. Mild increased stool is noted in the right colon. Status post cholecystectomy since the CT. No free air evident on the decubitus views. IMPRESSION: 1. No evidence of bowel obstruction. 2. Mild increased stool in the right colon mild right colon distention. 3. No free air. Electronically Signed: By: Lajean Manes M.D. On: 10/14/2016 17:10   US Thyroid  Result Date: 10/12/2016 CLINICAL DATA:  Incidental on CT.  Left thyroid nodule. EXAM: THYROID ULTRASOUND TECHNIQUE: Ultrasound examination of the thyroid gland and adjacent soft tissues was performed. COMPARISON:  None. FINDINGS: Parenchymal Echotexture: Mildly heterogenous Isthmus: 0.6 cm. Right lobe: 3.6 x 1.6 x 2.1 cm. Left lobe: 4.6 x 3.1 x 3.2 cm. _________________________________________________________ Estimated total number of  nodules >/= 1 cm: 5 Number of spongiform nodules >/=  2 cm not described below (TR1): 0 Number of mixed cystic and solid nodules >/= 1.5 cm not described below (TR2): 0 Nodule # 1: Location: Right; Mid Maximum size: 1.6 cm; Other 2 dimensions: 1.5 x 1.2 cm Composition: solid/almost completely solid (2) Echogenicity: isoechoic (1) Shape: taller-than-wide (3) Margins: smooth (0) Echogenic foci: none (0) ACR TI-RADS total points: 6. ACR TI-RADS risk category: TR4 (4-6 points). ACR TI-RADS recommendations: **Given size (>/= 1.5 cm) and appearance, fine needle aspiration of this moderately suspicious nodule should be considered based on TI-RADS criteria. Nodule # 2: Location: Right; Inferior Maximum size: 1.4 cm; Other 2 dimensions: 0.8 x 1.0 cm Composition: solid/almost completely solid (2) Echogenicity: hypoechoic (2) Shape: not taller-than-wide (0) Margins: smooth (0) Echogenic foci: none (0) ACR TI-RADS total points: 4. ACR TI-RADS risk category: TR4 (4-6 points). ACR TI-RADS recommendations: *Given size (>/= 1 - 1.4 cm) and appearance, a follow-up ultrasound in 1 year should be considered based on TI-RADS criteria. Nodule # 3: Location: Left; Superior Maximum size: 2.8 cm; Other 2 dimensions: 2.0 x 2.4 cm Composition: solid/almost completely solid (2) Echogenicity: isoechoic (1) Shape: not taller-than-wide (0) Margins: smooth (0) Echogenic foci: none (0) ACR TI-RADS total points: 3. ACR TI-RADS risk category: TR3 (3 points). ACR TI-RADS recommendations: **Given size (>/= 2.5 cm) and appearance, fine needle aspiration of this mildly suspicious nodule should be considered based on TI-RADS criteria. Other scattered nodules are smaller and have a benign appearance. IMPRESSION: Right and left dominant nodules meet criteria for fine needle aspiration biopsy. 1.4 cm right lower pole nodule meets criteria for annual follow-up. The above is in keeping with the ACR TI-RADS recommendations - J Am Coll Radiol 2017;14:587-595.  Electronically Signed   By: Marybelle Killings M.D.   On: 10/12/2016 11:17    Time Spent in minutes  35  Louellen Molder M.D on 10/15/2016 at 11:11 AM  Between 7am to 7pm - Pager - [phone]  After 7pm go to [email or web address] - password [facility]  [facility] -  Office  [phone]

## 2016-10-15 NOTE — Progress Notes (Signed)
Pt had a big bowel movement during the enema it was brown and hard to soft stool, VSS.

## 2016-10-16 DIAGNOSIS — Z5181 Encounter for therapeutic drug level monitoring: Secondary | ICD-10-CM

## 2016-10-16 DIAGNOSIS — I4891 Unspecified atrial fibrillation: Secondary | ICD-10-CM

## 2016-10-16 DIAGNOSIS — E785 Hyperlipidemia, unspecified: Secondary | ICD-10-CM

## 2016-10-16 DIAGNOSIS — Z7901 Long term (current) use of anticoagulants: Secondary | ICD-10-CM

## 2016-10-16 DIAGNOSIS — I10 Essential (primary) hypertension: Secondary | ICD-10-CM

## 2016-10-16 DIAGNOSIS — A419 Sepsis, unspecified organism: Principal | ICD-10-CM

## 2016-10-16 DIAGNOSIS — J189 Pneumonia, unspecified organism: Secondary | ICD-10-CM

## 2016-10-16 LAB — CBC
HCT: 35 % — ABNORMAL LOW (ref 36.0–46.0)
Hemoglobin: 11.3 g/dL — ABNORMAL LOW (ref 12.0–15.0)
MCH: 32.2 pg (ref 26.0–34.0)
MCHC: 32.3 g/dL (ref 30.0–36.0)
MCV: 99.7 fL (ref 78.0–100.0)
PLATELETS: 348 10*3/uL (ref 150–400)
RBC: 3.51 MIL/uL — ABNORMAL LOW (ref 3.87–5.11)
RDW: 15.6 % — AB (ref 11.5–15.5)
WBC: 12.8 10*3/uL — AB (ref 4.0–10.5)

## 2016-10-16 MED ORDER — DILTIAZEM HCL ER 60 MG PO CP12
120.0000 mg | ORAL_CAPSULE | Freq: Two times a day (BID) | ORAL | Status: AC
  Administered 2016-10-16 – 2016-10-18 (×5): 120 mg via ORAL
  Filled 2016-10-16 (×7): qty 2

## 2016-10-16 NOTE — Care Management Note (Addendum)
Case Management Note  Patient Details  Name: MICAHLA CABO MRN: [id number] Date of Birth: 1928-07-25  Subjective/Objective:     Presents with sepsis secondary to pna, afib with rvr, conts on cardizem drip, fetnal patch, plan home with [employer] when stable, will need resume orders.  NCM gave patient the eliquis 30 day savings card, left in her room.  NCM awaiting benefit check also.               Action/Plan:   Expected Discharge Date:                  Expected Discharge Plan:  [place]  In-House Referral:     Discharge planning Services  CM Consult  Post Acute Care Choice:  Resumption of Svcs/PTA Provider Choice offered to:  Patient  DME Arranged:    DME Agency:     HH Arranged:  Speech Therapy, RN, Social Work, Magazine features editor (active with [facility], Therapist, sports and SLP) [place] Agency:  [place]  Status of Service:  In process, will continue to follow  If discussed at Long Length of Stay Meetings, dates discussed:    Additional Comments:  Zenon Mayo, RN 10/16/2016, 3:03 PM

## 2016-10-16 NOTE — Consult Note (Signed)
Cardiology Consult    Patient ID: Amanda Hubbard MRN: [id number], DOB/AGE: 81/08/1928   Admit date: 10/08/2016 Date of Consult: 10/16/2016  Primary Physician: Redge Gainer, MD Reason for Consult: New onset atrial fibrillation Primary Cardiologist: Dr. Percival Spanish Requesting Provider: Dr. Clementeen Graham   History of Present Illness    Amanda Hubbard is an 83 year old female with past medical history significant for CVA in October 2017 (hospitalized in [place]), CAD S/P CABG in 2002, hypertension, dyslipidemia, stable pancreatic mass and diagnosis of cholecystitis 2 months ago who presented to the [facility] ED with complaints of worsening speech despite ongoing speech therapy and altered mental status. CT of the head was negative and pt has been treated for sepsis secondary to lobar pneumonia. She has been tachycardiac since admission and has been found to be in atrial fibrillation. Her rate has been difficult to control with diltiazem and we have been consulted. She has been started on anticoagulation.   Troponins were mildly elevated but flat with 0.06 (1/8) and 0.06 (1/9). Echo on 10/13/2015 showed normal LVEF 55%, elevated ventricular end-diastolic filling pressure and elevated left atrial filling pressure, mild AR and MR, no atrial septal defect or patent foramen ovale was identified, left atrium is normal size. CXR (10/08/16) showed streaky left base retrocardiac atelectasis or early infiltrate. No pulmonary edema.  EKG on 10/08/16- Sinus tachycardia vs atrial flutter with 2:1 conduction, LVH and anterolateral T wave inversions (similar to EKG in 2015).  EKG 10/10/16- atrial fibrillation with rate of 134 bpm.  Her most recent cardiology office visit with Dr. Percival Spanish was on 07/14/14 at which time she was doing well with no CV symptoms. Last echo was in 2009 with LVEF 45%.  10/08/16- in ED fever of 100.2 and tachycardia with rates 105-141, Code Sepsis initiated 10/10/16-  was given IV metoprolol for  tachycardia 10/11/16- atrial fibrillation with rates in 120's, echo ordered, CTA chest negative for PE 10/12/16- Tachycardia with rates 140's-150's, sepsis resolved with hydration and empiric antibiotics, TSH and T4 normal. Oral cardizem started for rate control. Eliquis started 10/13/16- HR still elevated, Cardizem drip initiated 10/16/16- Continues in aflutter with rates 100-120 on cardizem drip. Cardiology consult placed.   Past Medical History   Past Medical History:  Diagnosis Date  . CAD (coronary artery disease)    s/p CABG in 2002  . Dyslipidemia   . Female bladder prolapse    around 2013  . Hypertension   . Insomnia   . Mitral valve prolapse   . Osteopenia   . Osteoporosis   . Pneumonia 10/09/2016    Past Surgical History:  Procedure Laterality Date  . BLADDER SURGERY    . CORONARY ARTERY BYPASS GRAFT  2002   at [facility] of the [place] to the LAD, SVG to obtuse marginal 1, and SVG to PDA,   . NASAL FRACTURE SURGERY    . NEPHRECTOMY  1980's   Renal cell cancer s/p  . VESICOVAGINAL FISTULA CLOSURE W/ TAH       Allergies  Allergies  Allergen Reactions  . Bee Venom Anaphylaxis  . Penicillins Other (See Comments)    Inpatient Medications    . amLODipine  5 mg Oral Daily  . apixaban  2.5 mg Oral BID  . aspirin EC  81 mg Oral Daily  . atorvastatin  20 mg Oral QHS  . Chlorhexidine Gluconate Cloth  6 each Topical Q0600  . fentaNYL  50 mcg Transdermal Q72H  . gabapentin  300 mg  Oral TID  . mupirocin ointment  1 application Nasal BID  . polyethylene glycol  17 g Oral Daily  . senna-docusate  2 tablet Oral Daily  . sodium chloride flush  3 mL Intravenous Q12H  . sulfacetamide  1 drop Right Eye Q4H  . temazepam  30 mg Oral QHS   . diltiazem (CARDIZEM) infusion 2.5 mg/hr (10/16/16 0913)   Family History    Family History  Problem Relation Age of Onset  . Cancer Maternal Grandmother     ?    Social History    Social History   Social History  . Marital  status: Married    Spouse name: N/A  . Number of children: 3  . Years of education: N/A   Occupational History  . retired English as a second language teacher    Social History Main Topics  . Smoking status: Never Smoker  . Smokeless tobacco: Never Used  . Alcohol use No  . Drug use: No  . Sexual activity: Not on file   Other Topics Concern  . Not on file   Social History Narrative   Lives with husband.      Review of Systems    General:  No chills, fever, night sweats or weight changes.  Cardiovascular:  No chest pain, dyspnea on exertion, edema, orthopnea, palpitations, paroxysmal nocturnal dyspnea. Dermatological: No rash, lesions/masses Respiratory: No cough, dyspnea Urologic: No hematuria, dysuria Abdominal:   No nausea, vomiting, diarrhea, bright red blood per rectum, melena, or hematemesis Neurologic:  No visual changes, wkns, changes in mental status. All other systems reviewed and are otherwise negative except as noted above.  Physical Exam    Blood pressure 121/77, pulse (!) 109, temperature 98.9 F (37.2 C), temperature source Oral, resp. rate 18, height 5\' 2"  (1.575 m), weight 129 lb 6.6 oz (58.7 kg), SpO2 94 %.  General: Pleasant, chronically ill, NAD Psych: Normal affect. Neuro: Alert and oriented to person and place. Moves all extremities spontaneously. HEENT: Normal  Neck: Supple without bruits or JVD. Lungs:  Resp regular and unlabored, CTA. Heart: Irregularly irregular rhythm, no s3, s4, or murmurs. Abdomen: Soft, non-tender, non-distended, BS + x 4.  Extremities: No clubbing, cyanosis. Trace lower extremity edema. DP/PT/Radials 2+ and equal bilaterally.  Labs    Troponin (Point of Care Test) No results for input(s): TROPIPOC in the last 72 hours. No results for input(s): CKTOTAL, CKMB, TROPONINI in the last 72 hours. Lab Results  Component Value Date   WBC 12.8 (H) 10/16/2016   HGB 11.3 (L) 10/16/2016   HCT 35.0 (L) 10/16/2016   MCV 99.7 10/16/2016   PLT 348  10/16/2016    Recent Labs Lab 10/15/16 0249  NA 141  K 4.7  CL 115*  CO2 21*  BUN 11  CREATININE 0.96  CALCIUM 8.2*  GLUCOSE 110*   Lab Results  Component Value Date   CHOL 303 (H) 06/05/2016   HDL 64 06/05/2016   [place] Comment 06/05/2016   TRIG 514 (H) 06/05/2016   No results found for: [facility]   Radiology Studies    Dg Chest 1 View  Result Date: 10/08/2016 CLINICAL DATA:  [place] while at home today.  LEFT wrist pain. EXAM: CHEST 1 VIEW COMPARISON:  Chest radiograph October 08, 2016 at 1031 hours FINDINGS: Cardiac silhouette is mildly enlarged unchanged. Status post median sternotomy for CABG. Mildly calcified aortic knob. Similar strandy densities projecting LEFT lung base without pleural effusion or focal consolidation. No pneumothorax. Osteopenia. Soft tissue planes are nonsuspicious; calcifications  in neck are likely vascular IMPRESSION: Similar LEFT lung base atelectasis/ scarring. Mild cardiomegaly. Electronically Signed   By: Elon Alas M.D.   On: 10/08/2016 17:13   Dg Chest 2 View  Result Date: 10/08/2016 CLINICAL DATA:  Low grade temperature, possible infection EXAM: CHEST  2 VIEW COMPARISON:  07/09/2016 FINDINGS: Cardiomediastinal silhouette is stable. There is streaky left base retrocardiac atelectasis or early infiltrate. No pulmonary edema. Osteopenia and degenerative changes thoracic spine. IMPRESSION: Streaky left base retrocardiac atelectasis or early infiltrate. No pulmonary edema. Electronically Signed   By: Lahoma Crocker M.D.   On: 10/08/2016 11:46   Dg Wrist 2 Views Left  Result Date: 10/08/2016 CLINICAL DATA:  [place] while at home today.  LEFT wrist pain. EXAM: LEFT WRIST - 2 VIEW COMPARISON:  None. FINDINGS: No acute fracture deformity or dislocation. Osteopenia without destructive bony lesions. Moderate to severe first carpometacarpal joint space narrowing, periarticular sclerosis and marginal spurring compatible with osteoarthrosis. Faint intra-articular  calcifications compatible with CPPD. Dorsal wrist soft tissue swelling without subcutaneous gas or radiopaque foreign bodies. IMPRESSION: Soft tissue swelling without acute fracture deformity or dislocation. CPPD. Moderate to severe first carpometacarpal osteoarthrosis. Electronically Signed   By: Elon Alas M.D.   On: 10/08/2016 17:15   Ct Head Wo Contrast  Result Date: 10/08/2016 CLINICAL DATA:  Left facial droop. Left-sided weakness beginning last night. EXAM: CT HEAD WITHOUT CONTRAST TECHNIQUE: Contiguous axial images were obtained from the base of the skull through the vertex without intravenous contrast. COMPARISON:  07/09/2016. FINDINGS: Brain: There is atrophy and chronic small vessel disease changes. No acute intracranial abnormality. Specifically, no hemorrhage, hydrocephalus, mass lesion, acute infarction, or significant intracranial injury. Vascular: No hyperdense vessel or unexpected calcification. Skull: No acute calvarial abnormality. Sinuses/Orbits: Visualized paranasal sinuses and mastoids clear. Orbital soft tissues unremarkable. Other: None IMPRESSION: No acute intracranial abnormality. Atrophy, chronic microvascular disease. Electronically Signed   By: Rolm Baptise M.D.   On: 10/08/2016 11:35   Ct Angio Chest Pe W Or Wo Contrast  Result Date: 10/11/2016 CLINICAL DATA:  Shortness of breath and sinus tachycardia. Clinical suspicion for pulmonary embolism. EXAM: CT ANGIOGRAPHY CHEST WITH CONTRAST TECHNIQUE: Multidetector CT imaging of the chest was performed using the standard protocol during bolus administration of intravenous contrast. Multiplanar CT image reconstructions and MIPs were obtained to evaluate the vascular anatomy. CONTRAST:  100 mL Isovue 370 COMPARISON:  None. FINDINGS: Cardiovascular: Satisfactory opacification of pulmonary arteries noted, and no pulmonary emboli identified. No evidence of thoracic aortic dissection or aneurysm. Aortic atherosclerosis. Previous  coronary artery bypass grafting. Mild cardiomegaly. Left ventricular hypertrophy. 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 Mediastinum/Nodes: No pathologically enlarged lymph nodes within the thorax. A low-attenuation nodule is seen in the left thyroid lobe measuring 2.4 x 2.8 cm. This shows nonspecific features. Lungs/Pleura: Moderate bilateral pleural effusions are seen with mild dependent atelectasis. No evidence of pulmonary consolidation or mass. 4 mm pulmonary nodule seen in anterior left lower lobe on image 87/407. Upper abdomen: Small hiatal hernia. Musculoskeletal: No suspicious bone lesions or other significant abnormality identified. Review of the MIP images confirms the above findings. IMPRESSION: No evidence of pulmonary embolism. Moderate bilateral pleural effusions and dependent atelectasis. 4 mm indeterminate left lower lobe pulmonary nodule. No follow-up needed if patient is low-risk. Non-contrast chest CT can be considered in 12 months if patient is high-risk. This recommendation follows the consensus statement: Guidelines for Management of Incidental Pulmonary Nodules Detected on CT Images:  From the Fleischner Society 2017; Radiology 2017; [phone]. 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 2.8 cm left thyroid lobe nodule. Thyroid ultrasound recommended for further evaluation. This follows ACR consensus guidelines: Managing Incidental Thyroid Nodules Detected on Imaging: White Paper of the ACR Incidental Thyroid Findings Committee. J Am Coll Radiol 2015; 12:143-150. Small hiatal hernia. Electronically Signed   By: Earle Gell M.D.   On: 10/11/2016 17:04   Mr Brain Wo Contrast  Result Date: 10/08/2016 CLINICAL DATA:  Encephalopathy, weakness and altered mental status. History of hypertension, hyperlipidemia. EXAM: MRI HEAD WITHOUT CONTRAST TECHNIQUE: Multiplanar, multiecho pulse  sequences of the brain and surrounding structures were obtained without intravenous contrast. COMPARISON:  CT HEAD October 08, 2016 at 1118 hours and MRI of the head July 10, 2016 FINDINGS: Multiple sequences are moderately motion degraded. BRAIN: Faint reduced diffusion LEFT temporal lobe associated with confluent new FLAIR white matter T2 hyperintensities, no focal atrophy. Normalized ADC values. No susceptibility artifact to suggest hemorrhage. Old bilateral small cerebellar infarcts. Old RIGHT posterior insula infarct. Patchy to confluent supratentorial white matter FLAIR T2 hyperintensities ventricles and sulci are overall normal for patient's age. No midline shift, mass effect or abnormal extra-axial fluid collections. VASCULAR: Normal major intracranial vascular flow voids present at skull base. SKULL AND UPPER CERVICAL SPINE: No abnormal sellar expansion. No suspicious calvarial bone marrow signal. Craniocervical junction maintained. Subcentimeter probable pars intermedius cyst. SINUSES/ORBITS: RIGHT mastoid effusion. Trace paranasal sinus mucosal thickening. Status post LEFT ocular lens implant. The included ocular globes and orbital contents are non-suspicious. OTHER: None. IMPRESSION: No acute intracranial process on this motion degraded examination. LEFT temporal lobe subacute infarct, less likely infiltrative tumor. Recommend 1 month follow-up. Stable moderate to severe chronic small vessel ischemic disease, old small RIGHT MCA territory infarct and old cerebellar infarcts. Electronically Signed   By: Elon Alas M.D.   On: 10/08/2016 19:59   Ct Abdomen Pelvis W Contrast  Result Date: 10/08/2016 CLINICAL DATA:  Gallbladder disease. EXAM: CT ABDOMEN AND PELVIS WITH CONTRAST TECHNIQUE: Multidetector CT imaging of the abdomen and pelvis was performed using the standard protocol following bolus administration of intravenous contrast. CONTRAST:  1 ISOVUE-300 IOPAMIDOL (ISOVUE-300) INJECTION 61%  COMPARISON:  CT scan of January 08, 2015 and August 20, 2012. FINDINGS: Lower chest: No acute abnormality. Hepatobiliary: Distended gallbladder is noted with cholelithiasis. No focal parenchymal abnormality is noted in the liver. Mild intrahepatic and extrahepatic biliary dilatation is noted which was present on prior exam. Pancreas: 5.4 x 4.5 cm multi-cystic mass arises from the pancreatic head. Spleen: Normal in size without focal abnormality. Adrenals/Urinary Tract: Status post right nephrectomy. Adrenal glands appear normal. Stable left renal cyst is noted. No hydronephrosis or renal obstruction is noted. Urinary bladder appears normal. Stomach/Bowel: There is no evidence of bowel obstruction. Vascular/Lymphatic: Aortic atherosclerosis. No enlarged abdominal or pelvic lymph nodes. Reproductive: Status post hysterectomy. No adnexal masses. Other: No abdominal wall hernia or abnormality. No abdominopelvic ascites. Musculoskeletal: No acute or significant osseous findings. IMPRESSION: Distended gallbladder were cholelithiasis, but no evidence of cholecystitis. Mild intrahepatic and extrahepatic biliary dilatation is noted which was present on prior exam. Correlation with liver function tests is recommended to rule out biliary obstruction. Status post right nephrectomy. Aortic atherosclerosis. 5.4 x 4.5 cm multi-cystic mass seen in pancreatic head. This is concerning for either serous cystadenoma or possibly mucinous cystic neoplasm. Further evaluation with endoscopic ultrasound and cyst aspiration should be considered, if not already performed. Electronically Signed   By:  Marijo Conception, M.D.   On: 10/08/2016 15:24   US Renal  Result Date: 10/13/2016 CLINICAL DATA:  Oliguria EXAM: RENAL / URINARY TRACT ULTRASOUND COMPLETE COMPARISON:  CT scan January 2018 FINDINGS: Right Kidney: Surgically absent Left Kidney: Length: 9.7 cm. 1.9 cm cysts. No acute abnormalities. No hydronephrosis. Bladder: Appears normal for  degree of bladder distention. IMPRESSION: 1. Right nephrectomy. 2. 1.9 cm left renal cyst.  No acute left renal abnormality. 3. Right pleural effusion. 4. Stones and sludge in the gallbladder. Electronically Signed   By: Dorise Bullion III M.D   On: 10/13/2016 09:58   Dg Abd 2 Views  Addendum Date: 10/14/2016   ADDENDUM REPORT: 10/14/2016 17:48 ADDENDUM: Patient reportedly has not had a cholecystectomy. The surgical vascular clips in the right quadrant reflect changes from a right nephrectomy. Electronically Signed   By: Lajean Manes M.D.   On: 10/14/2016 17:48   Result Date: 10/14/2016 CLINICAL DATA:  Abdominal pain. EXAM: ABDOMEN - 2 VIEW COMPARISON:  CT, 10/08/2016 FINDINGS: Mild gaseous distention of the right colon, but no evidence of bowel obstruction. Mild increased stool is noted in the right colon. Status post cholecystectomy since the CT. No free air evident on the decubitus views. IMPRESSION: 1. No evidence of bowel obstruction. 2. Mild increased stool in the right colon mild right colon distention. 3. No free air. Electronically Signed: By: Lajean Manes M.D. On: 10/14/2016 17:10   US Thyroid  Result Date: 10/12/2016 CLINICAL DATA:  Incidental on CT.  Left thyroid nodule. EXAM: THYROID ULTRASOUND TECHNIQUE: Ultrasound examination of the thyroid gland and adjacent soft tissues was performed. COMPARISON:  None. FINDINGS: Parenchymal Echotexture: Mildly heterogenous Isthmus: 0.6 cm. Right lobe: 3.6 x 1.6 x 2.1 cm. Left lobe: 4.6 x 3.1 x 3.2 cm. _________________________________________________________ Estimated total number of nodules >/= 1 cm: 5 Number of spongiform nodules >/=  2 cm not described below (TR1): 0 Number of mixed cystic and solid nodules >/= 1.5 cm not described below (TR2): 0 Nodule # 1: Location: Right; Mid Maximum size: 1.6 cm; Other 2 dimensions: 1.5 x 1.2 cm Composition: solid/almost completely solid (2) Echogenicity: isoechoic (1) Shape: taller-than-wide (3) Margins: smooth  (0) Echogenic foci: none (0) ACR TI-RADS total points: 6. ACR TI-RADS risk category: TR4 (4-6 points). ACR TI-RADS recommendations: **Given size (>/= 1.5 cm) and appearance, fine needle aspiration of this moderately suspicious nodule should be considered based on TI-RADS criteria. Nodule # 2: Location: Right; Inferior Maximum size: 1.4 cm; Other 2 dimensions: 0.8 x 1.0 cm Composition: solid/almost completely solid (2) Echogenicity: hypoechoic (2) Shape: not taller-than-wide (0) Margins: smooth (0) Echogenic foci: none (0) ACR TI-RADS total points: 4. ACR TI-RADS risk category: TR4 (4-6 points). ACR TI-RADS recommendations: *Given size (>/= 1 - 1.4 cm) and appearance, a follow-up ultrasound in 1 year should be considered based on TI-RADS criteria. Nodule # 3: Location: Left; Superior Maximum size: 2.8 cm; Other 2 dimensions: 2.0 x 2.4 cm Composition: solid/almost completely solid (2) Echogenicity: isoechoic (1) Shape: not taller-than-wide (0) Margins: smooth (0) Echogenic foci: none (0) ACR TI-RADS total points: 3. ACR TI-RADS risk category: TR3 (3 points). ACR TI-RADS recommendations: **Given size (>/= 2.5 cm) and appearance, fine needle aspiration of this mildly suspicious nodule should be considered based on TI-RADS criteria. Other scattered nodules are smaller and have a benign appearance. IMPRESSION: Right and left dominant nodules meet criteria for fine needle aspiration biopsy. 1.4 cm right lower pole nodule meets criteria for annual follow-up. The above is  in keeping with the ACR TI-RADS recommendations - J Am Coll Radiol 2017;14:587-595. Electronically Signed   By: Marybelle Killings M.D.   On: 10/12/2016 11:17    EKG & Cardiac Imaging    EKG:  EKG on 10/08/16- Sinus tachycardia vs atrial flutter with 2:1 conduction, LVH and anterolateral T wave inversions (similar to EKG in 2015).  EKG 10/10/16- atrial fibrillation with rate of 134 bpm. EKG 10/12/16- atrial fibrillation, 109 bpm  Echocardiogram:    10/12/2016: Study Conclusions  - Left ventricle: Small area of apical akinesis with small tunnel   from mid cavity seen only with definity images The estimated   ejection fraction was 55%. Doppler parameters are consistent with   both elevated ventricular end-diastolic filling pressure and   elevated left atrial filling pressure. - Aortic valve: Leaflets not well seen cannot tell if trileaflet.   There was mild regurgitation. - Mitral valve: There was mild regurgitation. - Atrial septum: No defect or patent foramen ovale was identified. - Pulmonary arteries: PA peak pressure: 31 mm Hg (S).  Assessment & Plan    1. Atrial fibrillation with rapid ventricular response -Presumable new for patient, although she did have a CVA in 07/2016 which could be related. Unable to see records from that event in [place] -Afib with rates in the 120's- 140's, unresponsive to IV metoprolol. Rates improved somewhat with IV cardizem 10 mg/hr and now down to 2.5 mg /hr with rates 100's-110's. BP stable.  -Echo shows normal LV function and normal atrium. -This patients CHA2DS2-VASc Score is 7 (HTN, age (2), stroke (2), female, CAD) and unadjusted Ischemic Stroke Rate (% per year) is equal to 11.2 % stroke rate/year from a score of 7. After discussion with daughter by hospitalist the patient has been started on reduced dose Eliquis 2.5 mg bid (wt<60 kg, age >63) -Consider trial of oral diltiazem 60 mg q 6h and add beta blocker with metoprolol tartrate 25 mg bid po for better rate control and discontinue amlodipine to make room for blood pressure.  -Given co-morbidities, rate control is a reasonable goal. She is non ambulatory and not active and cardioversion is not likely to provide significant benefit to overall health.  2. Recent CVA -Echo shows no atrial defect or patent foramen ovale -Could be related to unidentified atrial fibrillation  3. CAD S/P CABG in 2002 -EKG with T wave inversions similar to 2015 -Pt  is vague with assessment but may have intermittent mild chest tightness. She is non-ambulatory. -Due to age and fragile medical health (she was previously on hospice care for failure to thrive, has improved) she is not a good candidate for ischemic workup -Continue aspirin and lipitor  4. Sepsis associated CAP -Treated by IM with antibiotics and improving  5. Hypertension -Well controlled without lows, 121-150/61-90 -Stop amlodipine and add diltiazem 240 mg daily  Signed, Daune Perch, NP-C 10/16/2016, 2:05 PM Pager: [phone]  The patient was seen, examined and discussed with Daune Perch, NP-C and I agree with the above.   83 year old female with h/o CVA in October 2017 (hospitalized in [place]), CAD S/P CABG in 2002, hypertension, dyslipidemia, stable pancreatic mass, who was admitted with complaints of worsening speech despite ongoing speech therapy and altered mental status. CT of the head was negative and pt has been treated for sepsis secondary to lobar pneumonia. She was found to be in atrial fibrillation with RVR the entire monitoring time. She has been started on anticoagulation. CHADS-VASc 7, high risk, agree with  Eliquis.  Troponins were minimally elevated but flat trend, she has normal LVEF, most probably demand ischemia sec to a-fib with RVR. Considering her age and comorbidities we will focus on rate control. She cardiovert spontaneously once sepsis/pneumonia resolves.  I would switch amlodipine to cardizem SR 120 mg po BID, if tolerated, switch to cardizem CD 240 mg po daily prior to discharge.  She doesn't appear fluid overloaded on physical exam. BP is controlled, we will follow. She also has mild AI and mild MR.   Ena Dawley, MD 10/16/2016

## 2016-10-16 NOTE — Progress Notes (Signed)
PROGRESS NOTE                                                                                                                                                                                                             Patient Demographics:    Amanda Hubbard, is a 83 y.o. female, DOB - 13-Jul-1928, [id number]  Admit date - 10/08/2016   Admitting Physician Waldemar Dickens, MD  Outpatient Primary MD for the patient is Redge Gainer, MD  LOS - 8  Outpatient Specialists: NONE  Chief Complaint  Patient presents with  . Cerebrovascular Accident       Brief Narrative   83 year old female with history of CVA on October 2017 (hospitalized in [place]) with residual speech impairment, chronically bedbound, coronary artery disease status post CABG in 2002, hypertension, dyslipidemia brought to the ED with worsening speech despite ongoing speech therapy and altered mental status. As per daughter she was acting differently over the past few days. Husband also noted some left-sided facial droop and complained of left wrist pain. Initially in the ED course stroke was called but head CT was negative and no residual weakness was noted. As per husband she was diagnosed with cholecystitis 2 months back and was awaiting outpatient surgical follow-up. In the ED she had fever of 100.32F,  tachycardic with heart rate in the veins of 105-141, tachypneic meeting criteria for sepsis. Chest x-ray showed retrocardiac opacity. Blood work showed elevated white count of about 20 4K and mild hypokalemia, mild renal insufficiency and elevated lactic acid of 2. Code sepsis was initiated and patient given IV fluid bolus along with vancomycin and Levaquin EKG showed anterolateral T-wave inversion and troponin of 0.04. Admitted to telemetry for sepsis secondary to pneumonia.   Subjective:   HR still runs up to 100-120s on low dose cardizem drip. Had good BM with  enema.    Assessment  & Plan :    Principal Problem:   A. fib with RVR New onset.. Still requiring Cardizem drip with step down monitoring. Patient's CHADS2Vasc is 6. After discussing different options, risk versus benefit with daughter patient started on on eliquis.  TSH and free T4 normal. 2-D echo shows EF of 55% with elevated ventricular end diastolic filling pressure and left atrial filling pressure.  Small area of apical akinesis seen. Will consult cardiology given persistent  A. fib without significant improvement on Cardizem drip.     Abdominal pain Still complains of off and on abdominal pain. Nontender on exam. Had good bowel movement with enema. As per family she complains of abdominal pain frequently at home as well. Labs unremarkable.  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    Sepsis ([place]) Secondary to lobar pneumonia. Resolved. Cultures negative. Completed 7 days antibiotic course.  Thyroid nodule Incidentally seen on CT angiogram. Thyroid function normal. Thyroid ultrasound showed dominant nodules, recommends FNA. Would not plan on biopsy at present given acute illness and can be be done as outpt if pt stable.Daughter informed and she agrees.    Metabolic encephalopathy Likely associated with sepsis. Has speech impairment with stroke a few months back. MRI brain shows subacute infarct recommend follow-up MRI in one month.  Per family she is back to her baseline.  History of stroke with speech impairment Continue aspirin and statin. Patient bedbound at home. Will benefit from anticoagulation. Will discuss with family.  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 Was seen by [facility] in past and recommended no future follow-up. Patient does not have any abdominal pain or distention at present. - recommend outpatient GI evaluation if needed .   left wrist pain Had a small hematoma on presentation. X-ray negative for fracture  showed severe osteoarthritis  CAD status post CABG Stable. Continue home medications    Type 2 diabetes mellitus, controlled ([place]) Stable. A1c of 6.5. Monitor on sliding sale coverage.    Hypokalemia and hypomagnesemia Replenished  Positive blood culture 1/2 culture on admission growing quite-negative staph. Likely contaminant.    Code Status : DO NOT RESUSCITATE  Family Communication  : Updated son on the phone (sons and daughter involved in care)  Disposition Plan  : Home pending improvement in her heart rate.  Barriers For Discharge : Rapid A. fib,   Consults  : cardiology  Procedures  :  MRI brain 2-D echo  CT angiogram of the chest Renal ultrasound  DVT Prophylaxis  :  Lovenox -  Lab Results  Component Value Date   PLT 348 10/16/2016    Antibiotics  :   Anti-infectives    Start     Dose/Rate Route Frequency Ordered Stop   10/11/16 1530  levofloxacin (LEVAQUIN) tablet 750 mg  Status:  Discontinued     750 mg Oral Every 48 hours 10/11/16 1515 10/14/16 1304   10/10/16 1445  aztreonam (AZACTAM) 1 g in dextrose 5 % 50 mL IVPB  Status:  Discontinued     1 g 100 mL/hr over 30 Minutes Intravenous Every 8 hours 10/10/16 1430 10/11/16 1515   10/10/16 1300  levofloxacin (LEVAQUIN) IVPB 750 mg  Status:  Discontinued     750 mg 100 mL/hr over 90 Minutes Intravenous Every 48 hours 10/08/16 1405 10/10/16 1358   10/09/16 1300  vancomycin (VANCOCIN) 500 mg in sodium chloride 0.9 % 100 mL IVPB  Status:  Discontinued     500 mg 100 mL/hr over 60 Minutes Intravenous Every 24 hours 10/08/16 1405 10/11/16 1515   10/08/16 1645  levofloxacin (LEVAQUIN) IVPB 750 mg  Status:  Discontinued     750 mg 100 mL/hr over 90 Minutes Intravenous  Once 10/08/16 1633 10/09/16 1307   10/08/16 1645  vancomycin (VANCOCIN) IVPB 1000 mg/200 mL premix  Status:  Discontinued     1,000 mg 200 mL/hr over 60 Minutes Intravenous  Once 10/08/16 1633 10/14/16 1234  10/08/16 1245  levofloxacin  (LEVAQUIN) IVPB 750 mg     750 mg 100 mL/hr over 90 Minutes Intravenous  Once 10/08/16 1233 10/08/16 1512   10/08/16 1245  vancomycin (VANCOCIN) IVPB 1000 mg/200 mL premix     1,000 mg 200 mL/hr over 60 Minutes Intravenous  Once 10/08/16 1233 10/08/16 1513        Objective:   Vitals:   10/16/16 0258 10/16/16 0400 10/16/16 0427 10/16/16 0800  BP: 134/79 129/73  (!) 142/78  Pulse: (!) 101 (!) 105  (!) 104  Resp: 16 16  17   Temp: 97.6 F (36.4 C)   98 F (36.7 C)  TempSrc: Oral   Oral  SpO2: 96% 95%  95%  Weight:   58.7 kg (129 lb 6.6 oz)   Height:        Wt Readings from Last 3 Encounters:  10/16/16 58.7 kg (129 lb 6.6 oz)  06/05/16 49 kg (108 lb)  08/29/15 45.8 kg (101 lb)     Intake/Output Summary (Last 24 hours) at 10/16/16 1037 Last data filed at 10/16/16 1000  Gross per 24 hour  Intake            172.5 ml  Output                0 ml  Net            172.5 ml     Physical Exam  Gen: not in distress,  HEENT:  moist mucosa, supple neck Chest:Clear bilaterally CVS: S1 and S2 irregular, no murmurs rub or gallop GI: soft, nondistended, non tender, Bowel sounds present Musculoskeletal: warm, no edema CNS: Alert and awake, oriented 1-2, slurred speech    Data Review:    CBC  Recent Labs Lab 10/10/16 1130 10/11/16 0637 10/12/16 0653 10/16/16 0528  WBC 19.9* 12.0* 11.4* 12.8*  HGB 10.9* 9.9* 9.9* 11.3*  HCT 33.7* 30.5* 29.9* 35.0*  PLT 278 278 302 348  MCV 98.5 98.7 98.0 99.7  MCH 31.9 32.0 32.5 32.2  MCHC 32.3 32.5 33.1 32.3  RDW 14.4 14.5 14.8 15.6*    Chemistries   Recent Labs Lab 10/10/16 1130 10/11/16 0637 10/12/16 0653 10/13/16 1051 10/14/16 0226 10/15/16 0249  NA 145 145 145 144 143 141  K 3.5 3.5 3.3* 3.7 3.5 4.7  CL 117* 117* 116* 113* 113* 115*  CO2 21* 21* 21* 23 22 21*  GLUCOSE 148* 106* 99 123* 119* 110*  BUN 12 8 6 7 9 11   CREATININE 0.80 0.76 0.88 0.86 0.85 0.96  CALCIUM 8.3* 8.1* 8.3* 8.8* 8.2* 8.2*  MG 1.8 1.8 1.8  --   1.9  --    ------------------------------------------------------------------------------------------------------------------ No results for input(s): CHOL, HDL, LDLCALC, TRIG, CHOLHDL, LDLDIRECT in the last 72 hours.  No results found for: HGBA1C ------------------------------------------------------------------------------------------------------------------ No results for input(s): TSH, T4TOTAL, T3FREE, THYROIDAB in the last 72 hours.  Invalid input(s): FREET3 ------------------------------------------------------------------------------------------------------------------ No results for input(s): VITAMINB12, FOLATE, FERRITIN, TIBC, IRON, RETICCTPCT in the last 72 hours.  Coagulation profile No results for input(s): INR, PROTIME in the last 168 hours.  No results for input(s): DDIMER in the last 72 hours.  Cardiac Enzymes No results for input(s): CKMB, TROPONINI, MYOGLOBIN in the last 168 hours.  Invalid input(s): CK ------------------------------------------------------------------------------------------------------------------ No results found for: BNP  Inpatient Medications  Scheduled Meds: . amLODipine  5 mg Oral Daily  . apixaban  2.5 mg Oral BID  . aspirin EC  81 mg Oral Daily  . atorvastatin  20 mg Oral QHS  . Chlorhexidine Gluconate Cloth  6 each Topical Q0600  . fentaNYL  50 mcg Transdermal Q72H  . gabapentin  300 mg Oral TID  . mupirocin ointment  1 application Nasal BID  . polyethylene glycol  17 g Oral Daily  . senna-docusate  2 tablet Oral Daily  . sodium chloride flush  3 mL Intravenous Q12H  . sulfacetamide  1 drop Right Eye Q4H  . temazepam  30 mg Oral QHS   Continuous Infusions: . diltiazem (CARDIZEM) infusion 2.5 mg/hr (10/16/16 0913)   PRN Meds:.acetaminophen, bisacodyl, levalbuterol, linaclotide, LORazepam, ondansetron **OR** ondansetron (ZOFRAN) IV  Micro Results Recent Results (from the past 240 hour(s))  Blood Culture (routine x 2)      Status: None   Collection Time: 10/08/16 12:44 PM  Result Value Ref Range Status   Specimen Description BLOOD LEFT FOREARM  Final   Special Requests BOTTLES DRAWN AEROBIC AND ANAEROBIC 5CC  Final   Culture NO GROWTH 5 DAYS  Final   Report Status 10/13/2016 FINAL  Final  Blood Culture (routine x 2)     Status: Abnormal   Collection Time: 10/08/16 12:45 PM  Result Value Ref Range Status   Specimen Description BLOOD RIGHT FOREARM  Final   Special Requests BOTTLES DRAWN AEROBIC AND ANAEROBIC 5CC  Final   Culture  Setup Time   Final    GRAM POSITIVE COCCI IN CLUSTERS Organism ID to follow CRITICAL RESULT CALLED TO, READ BACK BY AND VERIFIED WITH: AEdwina Barth 10:20 10/09/16 ([email or web address])    Culture (A)  Final    STAPHYLOCOCCUS SPECIES (COAGULASE NEGATIVE) THE SIGNIFICANCE OF ISOLATING THIS ORGANISM FROM A SINGLE SET OF BLOOD CULTURES WHEN MULTIPLE SETS ARE DRAWN IS UNCERTAIN. PLEASE NOTIFY THE MICROBIOLOGY DEPARTMENT WITHIN ONE WEEK IF SPECIATION AND SENSITIVITIES ARE REQUIRED.    Report Status 10/11/2016 FINAL  Final  Blood Culture ID Panel (Reflexed)     Status: Abnormal   Collection Time: 10/08/16 12:45 PM  Result Value Ref Range Status   Enterococcus species NOT DETECTED NOT DETECTED Final   Listeria monocytogenes NOT DETECTED NOT DETECTED Final   Staphylococcus species DETECTED (A) NOT DETECTED Final    Comment: CRITICAL RESULT CALLED TO, READ BACK BY AND VERIFIED WITH: AElta [place].D. 10:20 10/09/16 ([email or web address])    Staphylococcus aureus NOT DETECTED NOT DETECTED Final   Methicillin resistance DETECTED (A) NOT DETECTED Final    Comment: CRITICAL RESULT CALLED TO, READ BACK BY AND VERIFIED WITH: AElta [place].D. 10:20 10/09/16 ([email or web address])    Streptococcus species NOT DETECTED NOT DETECTED Final   Streptococcus agalactiae NOT DETECTED NOT DETECTED Final   Streptococcus pneumoniae NOT DETECTED NOT DETECTED Final   Streptococcus pyogenes NOT DETECTED NOT DETECTED Final   Acinetobacter  baumannii NOT DETECTED NOT DETECTED Final   Enterobacteriaceae species NOT DETECTED NOT DETECTED Final   Enterobacter cloacae complex NOT DETECTED NOT DETECTED Final   Escherichia coli NOT DETECTED NOT DETECTED Final   Klebsiella oxytoca NOT DETECTED NOT DETECTED Final   Klebsiella pneumoniae NOT DETECTED NOT DETECTED Final   Proteus species NOT DETECTED NOT DETECTED Final   Serratia marcescens NOT DETECTED NOT DETECTED Final   Haemophilus influenzae NOT DETECTED NOT DETECTED Final   Neisseria meningitidis NOT DETECTED NOT DETECTED Final   Pseudomonas aeruginosa NOT DETECTED NOT DETECTED Final   Candida albicans NOT DETECTED NOT DETECTED Final   Candida glabrata NOT DETECTED NOT DETECTED Final   Candida krusei NOT DETECTED NOT DETECTED Final   Candida  parapsilosis NOT DETECTED NOT DETECTED Final   Candida tropicalis NOT DETECTED NOT DETECTED Final  Urine culture     Status: None   Collection Time: 10/08/16  4:39 PM  Result Value Ref Range Status   Specimen Description URINE, CATHETERIZED  Final   Special Requests NONE  Final   Culture NO GROWTH  Final   Report Status 10/09/2016 FINAL  Final  Culture, Urine     Status: Abnormal   Collection Time: 10/13/16  4:36 PM  Result Value Ref Range Status   Specimen Description URINE, CLEAN CATCH  Final   Special Requests NONE  Final   Culture >=100,000 COLONIES/mL YEAST (A)  Final   Report Status 10/14/2016 FINAL  Final  MRSA PCR Screening     Status: Abnormal   Collection Time: 10/14/16 12:07 PM  Result Value Ref Range Status   MRSA by PCR POSITIVE (A) NEGATIVE Final    Comment:        The GeneXpert MRSA Assay (FDA approved for NASAL specimens only), is one component of a comprehensive MRSA colonization surveillance program. It is not intended to diagnose MRSA infection nor to guide or monitor treatment for MRSA infections. RESULT CALLED TO, READ BACK BY AND VERIFIED WITH: S. [facility] RN AT 1346 10/14/16 BY A.DAVIS     Radiology  Reports Dg Chest 1 View  Result Date: 10/08/2016 CLINICAL DATA:  [place] while at home today.  LEFT wrist pain. EXAM: CHEST 1 VIEW COMPARISON:  Chest radiograph October 08, 2016 at 1031 hours FINDINGS: Cardiac silhouette is mildly enlarged unchanged. Status post median sternotomy for CABG. Mildly calcified aortic knob. Similar strandy densities projecting LEFT lung base without pleural effusion or focal consolidation. No pneumothorax. Osteopenia. Soft tissue planes are nonsuspicious; calcifications in neck are likely vascular IMPRESSION: Similar LEFT lung base atelectasis/ scarring. Mild cardiomegaly. Electronically Signed   By: Elon Alas M.D.   On: 10/08/2016 17:13   Dg Chest 2 View  Result Date: 10/08/2016 CLINICAL DATA:  Low grade temperature, possible infection EXAM: CHEST  2 VIEW COMPARISON:  07/09/2016 FINDINGS: Cardiomediastinal silhouette is stable. There is streaky left base retrocardiac atelectasis or early infiltrate. No pulmonary edema. Osteopenia and degenerative changes thoracic spine. IMPRESSION: Streaky left base retrocardiac atelectasis or early infiltrate. No pulmonary edema. Electronically Signed   By: Lahoma Crocker M.D.   On: 10/08/2016 11:46   Dg Wrist 2 Views Left  Result Date: 10/08/2016 CLINICAL DATA:  [place] while at home today.  LEFT wrist pain. EXAM: LEFT WRIST - 2 VIEW COMPARISON:  None. FINDINGS: No acute fracture deformity or dislocation. Osteopenia without destructive bony lesions. Moderate to severe first carpometacarpal joint space narrowing, periarticular sclerosis and marginal spurring compatible with osteoarthrosis. Faint intra-articular calcifications compatible with CPPD. Dorsal wrist soft tissue swelling without subcutaneous gas or radiopaque foreign bodies. IMPRESSION: Soft tissue swelling without acute fracture deformity or dislocation. CPPD. Moderate to severe first carpometacarpal osteoarthrosis. Electronically Signed   By: Elon Alas M.D.   On: 10/08/2016  17:15   Ct Head Wo Contrast  Result Date: 10/08/2016 CLINICAL DATA:  Left facial droop. Left-sided weakness beginning last night. EXAM: CT HEAD WITHOUT CONTRAST TECHNIQUE: Contiguous axial images were obtained from the base of the skull through the vertex without intravenous contrast. COMPARISON:  07/09/2016. FINDINGS: Brain: There is atrophy and chronic small vessel disease changes. No acute intracranial abnormality. Specifically, no hemorrhage, hydrocephalus, mass lesion, acute infarction, or significant intracranial injury. Vascular: No hyperdense vessel or unexpected calcification. Skull: No acute calvarial  abnormality. Sinuses/Orbits: Visualized paranasal sinuses and mastoids clear. Orbital soft tissues unremarkable. Other: None IMPRESSION: No acute intracranial abnormality. Atrophy, chronic microvascular disease. Electronically Signed   By: Rolm Baptise M.D.   On: 10/08/2016 11:35   Ct Angio Chest Pe W Or Wo Contrast  Result Date: 10/11/2016 CLINICAL DATA:  Shortness of breath and sinus tachycardia. Clinical suspicion for pulmonary embolism. EXAM: CT ANGIOGRAPHY CHEST WITH CONTRAST TECHNIQUE: Multidetector CT imaging of the chest was performed using the standard protocol during bolus administration of intravenous contrast. Multiplanar CT image reconstructions and MIPs were obtained to evaluate the vascular anatomy. CONTRAST:  100 mL Isovue 370 COMPARISON:  None. FINDINGS: Cardiovascular: Satisfactory opacification of pulmonary arteries noted, and no pulmonary emboli identified. No evidence of thoracic aortic dissection or aneurysm. Aortic atherosclerosis. Previous coronary artery bypass grafting. Mild cardiomegaly. Left ventricular hypertrophy. 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 Mediastinum/Nodes: No pathologically enlarged lymph nodes within the thorax. A low-attenuation nodule is seen in the left thyroid lobe measuring 2.4 x 2.8 cm.  This shows nonspecific features. Lungs/Pleura: Moderate bilateral pleural effusions are seen with mild dependent atelectasis. No evidence of pulmonary consolidation or mass. 4 mm pulmonary nodule seen in anterior left lower lobe on image 87/407. Upper abdomen: Small hiatal hernia. Musculoskeletal: No suspicious bone lesions or other significant abnormality identified. Review of the MIP images confirms the above findings. IMPRESSION: No evidence of pulmonary embolism. Moderate bilateral pleural effusions and dependent atelectasis. 4 mm indeterminate left lower lobe pulmonary nodule. No follow-up needed if patient is low-risk. Non-contrast chest CT can be considered in 12 months if patient is high-risk. This recommendation follows the consensus statement: Guidelines for Management of Incidental Pulmonary Nodules Detected on CT Images: From the Fleischner Society 2017; Radiology 2017; 284:228-243. 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 2.8 cm left thyroid lobe nodule. Thyroid ultrasound recommended for further evaluation. This follows ACR consensus guidelines: Managing Incidental Thyroid Nodules Detected on Imaging: White Paper of the ACR Incidental Thyroid Findings Committee. J Am Coll Radiol 2015; 12:143-150. Small hiatal hernia. Electronically Signed   By: Earle Gell M.D.   On: 10/11/2016 17:04   Mr Brain Wo Contrast  Result Date: 10/08/2016 CLINICAL DATA:  Encephalopathy, weakness and altered mental status. History of hypertension, hyperlipidemia. EXAM: MRI HEAD WITHOUT CONTRAST TECHNIQUE: Multiplanar, multiecho pulse sequences of the brain and surrounding structures were obtained without intravenous contrast. COMPARISON:  CT HEAD October 08, 2016 at 1118 hours and MRI of the head July 10, 2016 FINDINGS: Multiple sequences are moderately motion degraded. BRAIN: Faint reduced diffusion LEFT temporal lobe associated with confluent new FLAIR white matter T2  hyperintensities, no focal atrophy. Normalized ADC values. No susceptibility artifact to suggest hemorrhage. Old bilateral small cerebellar infarcts. Old RIGHT posterior insula infarct. Patchy to confluent supratentorial white matter FLAIR T2 hyperintensities ventricles and sulci are overall normal for patient's age. No midline shift, mass effect or abnormal extra-axial fluid collections. VASCULAR: Normal major intracranial vascular flow voids present at skull base. SKULL AND UPPER CERVICAL SPINE: No abnormal sellar expansion. No suspicious calvarial bone marrow signal. Craniocervical junction maintained. Subcentimeter probable pars intermedius cyst. SINUSES/ORBITS: RIGHT mastoid effusion. Trace paranasal sinus mucosal thickening. Status post LEFT ocular lens implant. The included ocular globes and orbital contents are non-suspicious. OTHER: None. IMPRESSION: No acute intracranial process on this motion degraded examination. LEFT temporal lobe subacute infarct, less likely infiltrative tumor. Recommend 1 month follow-up. Stable moderate to severe chronic  small vessel ischemic disease, old small RIGHT MCA territory infarct and old cerebellar infarcts. Electronically Signed   By: Elon Alas M.D.   On: 10/08/2016 19:59   Ct Abdomen Pelvis W Contrast  Result Date: 10/08/2016 CLINICAL DATA:  Gallbladder disease. EXAM: CT ABDOMEN AND PELVIS WITH CONTRAST TECHNIQUE: Multidetector CT imaging of the abdomen and pelvis was performed using the standard protocol following bolus administration of intravenous contrast. CONTRAST:  1 ISOVUE-300 IOPAMIDOL (ISOVUE-300) INJECTION 61% COMPARISON:  CT scan of January 08, 2015 and August 20, 2012. FINDINGS: Lower chest: No acute abnormality. Hepatobiliary: Distended gallbladder is noted with cholelithiasis. No focal parenchymal abnormality is noted in the liver. Mild intrahepatic and extrahepatic biliary dilatation is noted which was present on prior exam. Pancreas: 5.4 x 4.5 cm  multi-cystic mass arises from the pancreatic head. Spleen: Normal in size without focal abnormality. Adrenals/Urinary Tract: Status post right nephrectomy. Adrenal glands appear normal. Stable left renal cyst is noted. No hydronephrosis or renal obstruction is noted. Urinary bladder appears normal. Stomach/Bowel: There is no evidence of bowel obstruction. Vascular/Lymphatic: Aortic atherosclerosis. No enlarged abdominal or pelvic lymph nodes. Reproductive: Status post hysterectomy. No adnexal masses. Other: No abdominal wall hernia or abnormality. No abdominopelvic ascites. Musculoskeletal: No acute or significant osseous findings. IMPRESSION: Distended gallbladder were cholelithiasis, but no evidence of cholecystitis. Mild intrahepatic and extrahepatic biliary dilatation is noted which was present on prior exam. Correlation with liver function tests is recommended to rule out biliary obstruction. Status post right nephrectomy. Aortic atherosclerosis. 5.4 x 4.5 cm multi-cystic mass seen in pancreatic head. This is concerning for either serous cystadenoma or possibly mucinous cystic neoplasm. Further evaluation with endoscopic ultrasound and cyst aspiration should be considered, if not already performed. Electronically Signed   By: Marijo Conception, M.D.   On: 10/08/2016 15:24   US Renal  Result Date: 10/13/2016 CLINICAL DATA:  Oliguria EXAM: RENAL / URINARY TRACT ULTRASOUND COMPLETE COMPARISON:  CT scan January 2018 FINDINGS: Right Kidney: Surgically absent Left Kidney: Length: 9.7 cm. 1.9 cm cysts. No acute abnormalities. No hydronephrosis. Bladder: Appears normal for degree of bladder distention. IMPRESSION: 1. Right nephrectomy. 2. 1.9 cm left renal cyst.  No acute left renal abnormality. 3. Right pleural effusion. 4. Stones and sludge in the gallbladder. Electronically Signed   By: Dorise Bullion III M.D   On: 10/13/2016 09:58   Dg Abd 2 Views  Addendum Date: 10/14/2016   ADDENDUM REPORT: 10/14/2016  17:48 ADDENDUM: Patient reportedly has not had a cholecystectomy. The surgical vascular clips in the right quadrant reflect changes from a right nephrectomy. Electronically Signed   By: Lajean Manes M.D.   On: 10/14/2016 17:48   Result Date: 10/14/2016 CLINICAL DATA:  Abdominal pain. EXAM: ABDOMEN - 2 VIEW COMPARISON:  CT, 10/08/2016 FINDINGS: Mild gaseous distention of the right colon, but no evidence of bowel obstruction. Mild increased stool is noted in the right colon. Status post cholecystectomy since the CT. No free air evident on the decubitus views. IMPRESSION: 1. No evidence of bowel obstruction. 2. Mild increased stool in the right colon mild right colon distention. 3. No free air. Electronically Signed: By: Lajean Manes M.D. On: 10/14/2016 17:10   US Thyroid  Result Date: 10/12/2016 CLINICAL DATA:  Incidental on CT.  Left thyroid nodule. EXAM: THYROID ULTRASOUND TECHNIQUE: Ultrasound examination of the thyroid gland and adjacent soft tissues was performed. COMPARISON:  None. FINDINGS: Parenchymal Echotexture: Mildly heterogenous Isthmus: 0.6 cm. Right lobe: 3.6 x 1.6 x 2.1 cm. Left  lobe: 4.6 x 3.1 x 3.2 cm. _________________________________________________________ Estimated total number of nodules >/= 1 cm: 5 Number of spongiform nodules >/=  2 cm not described below (TR1): 0 Number of mixed cystic and solid nodules >/= 1.5 cm not described below (TR2): 0 Nodule # 1: Location: Right; Mid Maximum size: 1.6 cm; Other 2 dimensions: 1.5 x 1.2 cm Composition: solid/almost completely solid (2) Echogenicity: isoechoic (1) Shape: taller-than-wide (3) Margins: smooth (0) Echogenic foci: none (0) ACR TI-RADS total points: 6. ACR TI-RADS risk category: TR4 (4-6 points). ACR TI-RADS recommendations: **Given size (>/= 1.5 cm) and appearance, fine needle aspiration of this moderately suspicious nodule should be considered based on TI-RADS criteria. Nodule # 2: Location: Right; Inferior Maximum size: 1.4 cm;  Other 2 dimensions: 0.8 x 1.0 cm Composition: solid/almost completely solid (2) Echogenicity: hypoechoic (2) Shape: not taller-than-wide (0) Margins: smooth (0) Echogenic foci: none (0) ACR TI-RADS total points: 4. ACR TI-RADS risk category: TR4 (4-6 points). ACR TI-RADS recommendations: *Given size (>/= 1 - 1.4 cm) and appearance, a follow-up ultrasound in 1 year should be considered based on TI-RADS criteria. Nodule # 3: Location: Left; Superior Maximum size: 2.8 cm; Other 2 dimensions: 2.0 x 2.4 cm Composition: solid/almost completely solid (2) Echogenicity: isoechoic (1) Shape: not taller-than-wide (0) Margins: smooth (0) Echogenic foci: none (0) ACR TI-RADS total points: 3. ACR TI-RADS risk category: TR3 (3 points). ACR TI-RADS recommendations: **Given size (>/= 2.5 cm) and appearance, fine needle aspiration of this mildly suspicious nodule should be considered based on TI-RADS criteria. Other scattered nodules are smaller and have a benign appearance. IMPRESSION: Right and left dominant nodules meet criteria for fine needle aspiration biopsy. 1.4 cm right lower pole nodule meets criteria for annual follow-up. The above is in keeping with the ACR TI-RADS recommendations - J Am Coll Radiol 2017;14:587-595. Electronically Signed   By: Marybelle Killings M.D.   On: 10/12/2016 11:17    Time Spent in minutes  35   Louellen Molder M.D on 10/16/2016 at 10:37 AM  Between 7am to 7pm - Pager - [phone]  After 7pm go to [email or web address] - password [facility]  [facility] -  Office  [phone]

## 2016-10-16 NOTE — Progress Notes (Signed)
BENEFIT CHEC COMPLETED ON 10-15-16   [facility]  @ [place] # [phone]   1. ELIQUIS 2.5 MG BID  COVER- YES  CO-PAY- $ 45.00  TIER- 3 DRUG  PRIOR APPROVAL- NO   2. ELIQUIS 5 MG BID  COVER- YES  CO-PAY- $ 45.00  TIER- 3 DRUG  PRIOR APPROVAL- NO   PHARMACY : ANY RETAIL

## 2016-10-17 DIAGNOSIS — I2510 Atherosclerotic heart disease of native coronary artery without angina pectoris: Secondary | ICD-10-CM

## 2016-10-17 LAB — MAGNESIUM: MAGNESIUM: 1.9 mg/dL (ref 1.7–2.4)

## 2016-10-17 LAB — BASIC METABOLIC PANEL
Anion gap: 8 (ref 5–15)
BUN: 8 mg/dL (ref 6–20)
CALCIUM: 8.2 mg/dL — AB (ref 8.9–10.3)
CO2: 23 mmol/L (ref 22–32)
CREATININE: 0.94 mg/dL (ref 0.44–1.00)
Chloride: 113 mmol/L — ABNORMAL HIGH (ref 101–111)
GFR calc Af Amer: >60 mL/min (ref >60)
GFR, EST NON AFRICAN AMERICAN: 53 mL/min — AB (ref >60)
GLUCOSE: 95 mg/dL (ref 65–99)
POTASSIUM: 4.1 mmol/L (ref 3.5–5.1)
SODIUM: 144 mmol/L (ref 135–145)

## 2016-10-17 NOTE — Progress Notes (Addendum)
Patient ID: Amanda Hubbard, female   DOB: 04-02-1928, 83 y.o.   MRN: [id number]  PROGRESS NOTE    TREVINA CATALLO  [id number] DOB: 10-Sep-1928 DOA: 10/08/2016  PCP: Redge Gainer, MD   Brief Narrative:  83 year old female with history of CVA on October 2017 (hospitalized in [place]) with residual speech impairment, chronically bedbound, coronary artery disease status post CABG in 2002, hypertension, dyslipidemia who presented to ED with worsening speech despite ongoing speech therapy and altered mental status.  As per daughter, pt was acting differently over the past few days PTA. Husband also noted some left-sided facial droop and complained of left wrist pain.  Initially in the ED code stroke was called but head CT was negative and no residual weakness was noted. As per husband she was diagnosed with cholecystitis 2 months back and was awaiting outpatient surgical follow-up. In the ED pt had fever of 100.93F, was tachycardic with heart rate of 105-141, tachypneic. Chest x-ray showed retrocardiac opacity. Blood work showed elevated white count of about 20K and mild hypokalemia, mild renal insufficiency and elevated lactic acid of 2. Code sepsis was initiated and patient was given IV fluid bolus along with vancomycin and Levaquin. The 12 lead EKG showed anterolateral T-wave inversion and troponin of 0.06.  Assessment & Plan:   Principal Problem: Atrial fibrillation with RVR - New onset - CHADS2Vasc is 6 - On AC with apixaban - Rate controlled with Cardizem 120 mg PO Q 12 hours  - 2 D EHCO with EF of 55% with elevated ventricular end diastolic filling pressure and left atrial filling pressure.  Small area of apical akinesis seen. - TSH and free T4 normal. - Cardio following   Mild troponin elevation - Due to demand ischemia from sepsis, pneumonia, a fib - No chest pain - Cardio is following - On apixaban and Asprin   Abdominal pain - Possibly from pancreatic mass/ IPMN seen on CT    Sepsis secondary to pneumonia (HCC) - Resolved - Blood cx negative - Completed 7 days of abx   Thyroid nodule - Incidentally seen on CT angiogram. Thyroid function normal. - Thyroid ultrasound showed dominant nodules, recommends FNA. Per TRH yesterday, would not plan on biopsy at present given acute illness and can be be done as outpt if pt stable.  Metabolic encephalopathy - Likely associated with sepsis, stroke  - MRI brain showed subacute infarct, recommend follow-up MRI in one month.   History of stroke with speech impairment / Dyslipidemia  - Continue aspirin and statin.  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 - Was seen by [facility] in past and recommended no further work up other than outpt follow up with GI  Left wrist pain - Had a small hematoma on presentation.  - X-ray negative for fracture but it did show severe osteoarthritis  CAD status post CABG - Stable. Continue aspirin, statin   Type 2 diabetes mellitus, controlled not on long term insulin (HCC) - Stable. A1c of 6.5  Diabetic neuropathy - Continue gabapentin   Hypokalemia and hypomagnesemia - Supplemented and WNL - Follow up BMP and mangesium level in am   Positive blood culture - 1/2 culture on admission growing staph coag negative species, likely contaminant   DVT prophylaxis: on apixaban  Code Status:DNR/DNI Family Communication: no family at the bedside this am Disposition Plan: transfer to telemetry floor    Consultants:   Cardio   Procedures:   2 D  ECHO EF 55%  Antimicrobials:   None    Subjective: No overnight events.  Objective: Vitals:   10/16/16 2314 10/17/16 0333 10/17/16 0400 10/17/16 0429  BP: 118/77 109/61 121/74   Pulse: (!) 106 82 91   Resp: 17 19 (!) 21   Temp: 98.6 F (37 C) 98.6 F (37 C)    TempSrc: Oral Oral    SpO2: 94% 92% 93%   Weight:    59.6 kg (131 lb 6.3 oz)  Height:         Intake/Output Summary (Last 24 hours) at 10/17/16 0725 Last data filed at 10/17/16 0000  Gross per 24 hour  Intake           520.63 ml  Output                0 ml  Net           520.63 ml   Filed Weights   10/15/16 0441 10/16/16 0427 10/17/16 0429  Weight: 60.1 kg (132 lb 7.9 oz) 58.7 kg (129 lb 6.6 oz) 59.6 kg (131 lb 6.3 oz)    Examination:  General exam: Appears calm and comfortable  Respiratory system: Clear to auscultation. Respiratory effort normal. Cardiovascular system: S1 & S2 heard, tachycardic Gastrointestinal system: Abdomen is nondistended, soft and nontender. No organomegaly or masses felt. Normal bowel sounds heard. Central nervous system: Oriented to self only, knows she is in [place] but not exactly that she is in hospital, does not know date  Extremities: Symmetric 5 x 5 power. Skin: No rashes, lesions or ulcers Psychiatry: Judgement and insight appear normal. Mood & affect appropriate.   Data Reviewed: I have personally reviewed following labs and imaging studies  CBC:  Recent Labs Lab 10/10/16 1130 10/11/16 0637 10/12/16 0653 10/16/16 0528  WBC 19.9* 12.0* 11.4* 12.8*  HGB 10.9* 9.9* 9.9* 11.3*  HCT 33.7* 30.5* 29.9* 35.0*  MCV 98.5 98.7 98.0 99.7  PLT 278 278 302 0000000   Basic Metabolic Panel:  Recent Labs Lab 10/10/16 1130 10/11/16 0637 10/12/16 0653 10/13/16 1051 10/14/16 0226 10/15/16 0249 10/17/16 0351  NA 145 145 145 144 143 141 144  K 3.5 3.5 3.3* 3.7 3.5 4.7 4.1  CL 117* 117* 116* 113* 113* 115* 113*  CO2 21* 21* 21* 23 22 21* 23  GLUCOSE 148* 106* 99 123* 119* 110* 95  BUN 12 8 6 7 9 11 8   CREATININE 0.80 0.76 0.88 0.86 0.85 0.96 0.94  CALCIUM 8.3* 8.1* 8.3* 8.8* 8.2* 8.2* 8.2*  MG 1.8 1.8 1.8  --  1.9  --  1.9   GFR: Estimated Creatinine Clearance: 34 mL/min (by C-G formula based on SCr of 0.94 mg/dL). Liver Function Tests: No results for input(s): AST, ALT, ALKPHOS, BILITOT, PROT, ALBUMIN in the last 168 hours. No  results for input(s): LIPASE, AMYLASE in the last 168 hours. No results for input(s): AMMONIA in the last 168 hours. Coagulation Profile: No results for input(s): INR, PROTIME in the last 168 hours. Cardiac Enzymes: No results for input(s): CKTOTAL, CKMB, CKMBINDEX, TROPONINI in the last 168 hours. BNP (last 3 results) No results for input(s): PROBNP in the last 8760 hours. HbA1C: No results for input(s): HGBA1C in the last 72 hours. CBG: No results for input(s): GLUCAP in the last 168 hours. Lipid Profile: No results for input(s): CHOL, HDL, LDLCALC, TRIG, CHOLHDL, LDLDIRECT in the last 72 hours. Thyroid Function Tests: No results for input(s): TSH, T4TOTAL, FREET4, T3FREE, THYROIDAB in the last  72 hours. Anemia Panel: No results for input(s): VITAMINB12, FOLATE, FERRITIN, TIBC, IRON, RETICCTPCT in the last 72 hours. Urine analysis:    Component Value Date/Time   COLORURINE YELLOW 10/13/2016 0850   APPEARANCEUR CLOUDY (A) 10/13/2016 0850   APPEARANCEUR Clear 08/07/2016 1126   LABSPEC 1.018 10/13/2016 0850   PHURINE 5.0 10/13/2016 0850   GLUCOSEU NEGATIVE 10/13/2016 0850   HGBUR NEGATIVE 10/13/2016 0850   BILIRUBINUR NEGATIVE 10/13/2016 0850   BILIRUBINUR Negative 08/07/2016 Echo 10/13/2016 0850   PROTEINUR NEGATIVE 10/13/2016 0850   UROBILINOGEN 0.2 06/10/2008 2207   NITRITE NEGATIVE 10/13/2016 0850   LEUKOCYTESUR LARGE (A) 10/13/2016 0850   LEUKOCYTESUR Negative 08/07/2016 1126   Sepsis Labs: @LABRCNTIP (procalcitonin:4,lacticidven:4)  Blood Culture (routine x 2)     Status: None   Collection Time: 10/08/16 12:44 PM  Result Value Ref Range Status   Specimen Description BLOOD LEFT FOREARM  Final   Special Requests BOTTLES DRAWN AEROBIC AND ANAEROBIC 5CC  Final   Culture NO GROWTH 5 DAYS  Final   Report Status 10/13/2016 FINAL  Final  Blood Culture (routine x 2)     Status: Abnormal   Collection Time: 10/08/16 12:45 PM  Result Value Ref Range  Status   Specimen Description BLOOD RIGHT FOREARM  Final   Special Requests BOTTLES DRAWN AEROBIC AND ANAEROBIC 5CC  Final   Culture  Setup Time   Final   Culture (A)  Final    STAPHYLOCOCCUS SPECIES (COAGULASE NEGATIVE   Report Status 10/11/2016 FINAL  Final  Blood Culture ID Panel (Reflexed)     Status: Abnormal   Collection Time: 10/08/16 12:45 PM  Result Value Ref Range Status   Enterococcus species NOT DETECTED NOT DETECTED Final   Listeria monocytogenes NOT DETECTED NOT DETECTED Final   Staphylococcus species DETECTED (A) NOT DETECTED Final   Staphylococcus aureus NOT DETECTED NOT DETECTED Final   Methicillin resistance DETECTED (A) NOT DETECTED Final   Streptococcus species NOT DETECTED NOT DETECTED Final   Streptococcus agalactiae NOT DETECTED NOT DETECTED Final   Streptococcus pneumoniae NOT DETECTED NOT DETECTED Final   Streptococcus pyogenes NOT DETECTED NOT DETECTED Final   Acinetobacter baumannii NOT DETECTED NOT DETECTED Final   Enterobacteriaceae species NOT DETECTED NOT DETECTED Final   Enterobacter cloacae complex NOT DETECTED NOT DETECTED Final   Escherichia coli NOT DETECTED NOT DETECTED Final   Klebsiella oxytoca NOT DETECTED NOT DETECTED Final   Klebsiella pneumoniae NOT DETECTED NOT DETECTED Final   Proteus species NOT DETECTED NOT DETECTED Final   Serratia marcescens NOT DETECTED NOT DETECTED Final   Haemophilus influenzae NOT DETECTED NOT DETECTED Final   Neisseria meningitidis NOT DETECTED NOT DETECTED Final   Pseudomonas aeruginosa NOT DETECTED NOT DETECTED Final   Candida albicans NOT DETECTED NOT DETECTED Final   Candida glabrata NOT DETECTED NOT DETECTED Final   Candida krusei NOT DETECTED NOT DETECTED Final   Candida parapsilosis NOT DETECTED NOT DETECTED Final   Candida tropicalis NOT DETECTED NOT DETECTED Final  Urine culture     Status: None   Collection Time: 10/08/16  4:39 PM  Result Value Ref Range Status   Specimen Description URINE,  CATHETERIZED  Final   Special Requests NONE  Final   Culture NO GROWTH  Final   Report Status 10/09/2016 FINAL  Final  Culture, Urine     Status: Abnormal   Collection Time: 10/13/16  4:36 PM  Result Value Ref Range Status   Specimen Description URINE, CLEAN CATCH  Final   Special Requests NONE  Final   Culture >=100,000 COLONIES/mL YEAST (A)  Final   Report Status 10/14/2016 FINAL  Final  MRSA PCR Screening     Status: Abnormal   Collection Time: 10/14/16 12:07 PM  Result Value Ref Range Status   MRSA by PCR POSITIVE (A) NEGATIVE Final      Radiology Studies:  Dg Chest 1 View Result Date: 10/08/2016 Similar LEFT lung base atelectasis/ scarring. Mild cardiomegaly.   Dg Chest 2 View Result Date: 10/08/2016 Streaky left base retrocardiac atelectasis or early infiltrate. No pulmonary edema.   Dg Wrist 2 Views Left Result Date: 10/08/2016  Soft tissue swelling without acute fracture deformity or dislocation. CPPD. Moderate to severe first carpometacarpal osteoarthrosis. Electronically Signed   By: Elon Alas M.D.   On: 10/08/2016 17:15   Ct Head Wo Contrast Result Date: 10/08/2016 No evidence of pulmonary embolism. Moderate bilateral pleural effusions and dependent atelectasis. 4 mm indeterminate left lower lobe pulmonary nodule. No follow-up needed if patient is low-risk. Non-contrast chest CT can be considered in 12 months if patient is high-risk. This recommendation follows the consensus statement: Guidelines for Management of Incidental Pulmonary Nodules Detected on CT Images: From the Fleischner Society 2017; Radiology 2017; 284:228-243. 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 2.8 cm left thyroid lobe nodule. Thyroid ultrasound recommended for further evaluation. This follows ACR consensus guidelines: Managing Incidental Thyroid Nodules Detected on Imaging: White Paper of the ACR Incidental Thyroid Findings Committee. J Am Coll  Radiol 2015; 12:143-150. Small hiatal hernia. Electronically Signed   By: Earle Gell M.D.   On: 10/11/2016 17:04   Mr Brain Wo Contrast Result Date: 10/08/2016 No acute intracranial process on this motion degraded examination. LEFT temporal lobe subacute infarct, less likely infiltrative tumor. Recommend 1 month follow-up. Stable moderate to severe chronic small vessel ischemic disease, old small RIGHT MCA territory infarct and old cerebellar infarcts. Electronically Signed   By: Elon Alas M.D.   On: 10/08/2016 19:59   Ct Abdomen Pelvis W Contrast Result Date: 10/08/2016 Distended gallbladder were cholelithiasis, but no evidence of cholecystitis. Mild intrahepatic and extrahepatic biliary dilatation is noted which was present on prior exam. Correlation with liver function tests is recommended to rule out biliary obstruction. Status post right nephrectomy. Aortic atherosclerosis. 5.4 x 4.5 cm multi-cystic mass seen in pancreatic head. This is concerning for either serous cystadenoma or possibly mucinous cystic neoplasm. Further evaluation with endoscopic ultrasound and cyst aspiration should be considered, if not already performed. Electronically Signed   By: Marijo Conception, M.D.   On: 10/08/2016 15:24   US Renal Result Date: 10/13/2016  1. Right nephrectomy. 2. 1.9 cm left renal cyst.  No acute left renal abnormality. 3. Right pleural effusion. 4. Stones and sludge in the gallbladder. Electronically Signed   By: Dorise Bullion III M.D   On: 10/13/2016 09:58   Dg Abd 2 Views Addendum Date: 10/14/2016   ADDENDUM REPORT: 10/14/2016 17:48 ADDENDUM: Patient reportedly has not had a cholecystectomy. The surgical vascular clips in the right quadrant reflect changes from a right nephrectomy. Electronically Signed   By: Lajean Manes M.D.   On: 10/14/2016 17:48  Result Date: 10/14/2016  1. No evidence of bowel obstruction. 2. Mild increased stool in the right colon mild right colon distention. 3. No  free air. Electronically Signed: By: Lajean Manes M.D. On: 10/14/2016 17:10   US Thyroid Result Date: 10/12/2016 Right and left dominant nodules  meet criteria for fine needle aspiration biopsy. 1.4 cm right lower pole nodule meets criteria for annual follow-up. The above is in keeping with the ACR TI-RADS recommendations - J Am Coll Radiol 2017;14:587-595. Electronically Signed   By: Marybelle Killings M.D.   On: 10/12/2016 11:17    Scheduled Meds: . apixaban  2.5 mg Oral BID  . aspirin EC  81 mg Oral Daily  . atorvastatin  20 mg Oral QHS  . Chlorhexidine Gluconate Cloth  6 each Topical Q0600  . diltiazem  120 mg Oral Q12H  . fentaNYL  50 mcg Transdermal Q72H  . gabapentin  300 mg Oral TID  . mupirocin ointment  1 application Nasal BID  . polyethylene glycol  17 g Oral Daily  . senna-docusate  2 tablet Oral Daily  . sodium chloride flush  3 mL Intravenous Q12H  . sulfacetamide  1 drop Right Eye Q4H  . temazepam  30 mg Oral QHS   Continuous Infusions:   LOS: 9 days    Time spent: 15 minutes  Greater than 50% of the time spent on counseling and coordinating the care.   Leisa Lenz, MD [facility] Pager [phone]  If 7PM-7AM, please contact night-coverage [email or web address] Password [email or web address] 10/17/2016, 7:25 AM

## 2016-10-17 NOTE — Progress Notes (Signed)
Patient arrived to 2W room 36.  Telemetry monitor applied and CCMD notified.  Patient oriented to unit and room including call light and phone.  Will continue to mointor.

## 2016-10-17 NOTE — Progress Notes (Addendum)
Patient Name: Amanda Hubbard Date of Encounter: 10/17/2016  Primary Cardiologist: Dr. [facility] Problem List     Principal Problem:   Sepsis [facility]) Active Problems:   Dyslipidemia   Essential hypertension   Coronary atherosclerosis   Type 2 diabetes mellitus, controlled ([place])   CAP (community acquired pneumonia)    Subjective   Breathing is better today. HR controlled. She is sleeping.  Inpatient Medications    Scheduled Meds: . apixaban  2.5 mg Oral BID  . aspirin EC  81 mg Oral Daily  . atorvastatin  20 mg Oral QHS  . Chlorhexidine Gluconate Cloth  6 each Topical Q0600  . diltiazem  120 mg Oral Q12H  . fentaNYL  50 mcg Transdermal Q72H  . gabapentin  300 mg Oral TID  . mupirocin ointment  1 application Nasal BID  . polyethylene glycol  17 g Oral Daily  . senna-docusate  2 tablet Oral Daily  . sodium chloride flush  3 mL Intravenous Q12H  . sulfacetamide  1 drop Right Eye Q4H  . temazepam  30 mg Oral QHS   Continuous Infusions:  PRN Meds: acetaminophen, bisacodyl, levalbuterol, linaclotide, LORazepam, ondansetron **OR** ondansetron (ZOFRAN) IV   Vital Signs    Vitals:   10/17/16 0333 10/17/16 0400 10/17/16 0429 10/17/16 0700  BP: 109/61 121/74  131/69  Pulse: 82 91  94  Resp: 19 (!) 21  18  Temp: 98.6 F (37 C)   97.5 F (36.4 C)  TempSrc: Oral   Oral  SpO2: 92% 93%  94%  Weight:   131 lb 6.3 oz (59.6 kg)   Height:        Intake/Output Summary (Last 24 hours) at 10/17/16 1018 Last data filed at 10/17/16 0000  Gross per 24 hour  Intake           510.63 ml  Output                0 ml  Net           510.63 ml   Filed Weights   10/15/16 0441 10/16/16 0427 10/17/16 0429  Weight: 132 lb 7.9 oz (60.1 kg) 129 lb 6.6 oz (58.7 kg) 131 lb 6.3 oz (59.6 kg)    Physical Exam   GEN: Pleasant chronically ill in no acute distress.  HEENT: Grossly normal.  Neck: Supple, no JVD, carotid bruits, or masses. Cardiac: RRR, no murmurs, rubs, or  gallops. No clubbing, cyanosis, edema.  Radials/DP/PT 2+ and equal bilaterally.  Respiratory:  Respirations regular and unlabored, clear to auscultation bilaterally. GI: Soft, nontender, nondistended, BS + x 4. MS: no deformity or atrophy. Skin: warm and dry, no rash. Neuro:  Strength and sensation are intact. Psych: AAOx3.  Normal affect.  Labs    CBC  Recent Labs  10/16/16 0528  WBC 12.8*  HGB 11.3*  HCT 35.0*  MCV 99.7  PLT 0000000   Basic Metabolic Panel  Recent Labs  10/15/16 0249 10/17/16 0351  NA 141 144  K 4.7 4.1  CL 115* 113*  CO2 21* 23  GLUCOSE 110* 95  BUN 11 8  CREATININE 0.96 0.94  CALCIUM 8.2* 8.2*  MG  --  1.9   Liver Function Tests No results for input(s): AST, ALT, ALKPHOS, BILITOT, PROT, ALBUMIN in the last 72 hours. No results for input(s): LIPASE, AMYLASE in the last 72 hours. Cardiac Enzymes No results for input(s): CKTOTAL, CKMB, CKMBINDEX, TROPONINI in the last 72 hours. BNP Invalid  input(s): POCBNP D-Dimer No results for input(s): DDIMER in the last 72 hours. Hemoglobin A1C No results for input(s): HGBA1C in the last 72 hours. Fasting Lipid Panel No results for input(s): CHOL, HDL, LDLCALC, TRIG, CHOLHDL, LDLDIRECT in the last 72 hours. Thyroid Function Tests No results for input(s): TSH, T4TOTAL, T3FREE, THYROIDAB in the last 72 hours.  Invalid input(s): FREET3  Telemetry    AF rates 90-100s - Personally Reviewed  ECG    N/A - Personally Reviewed  Radiology    No results found.  Cardiac Studies   TTE: 10/12/16  Study Conclusions  - Left ventricle: Small area of apical akinesis with small tunnel   from mid cavity seen only with definity images The estimated   ejection fraction was 55%. Doppler parameters are consistent with   both elevated ventricular end-diastolic filling pressure and   elevated left atrial filling pressure. - Aortic valve: Leaflets not well seen cannot tell if trileaflet.   There was mild  regurgitation. - Mitral valve: There was mild regurgitation. - Atrial septum: No defect or patent foramen ovale was identified. - Pulmonary arteries: PA peak pressure: 31 mm Hg (S).  Patient Profile     83 year old female with past medical history significant for CVA in October 2017 (hospitalized in [place]), CAD S/P CABG in 2002, hypertension, dyslipidemia, stable pancreatic mass and diagnosis of cholecystitis 2 months ago who presented to the [facility] ED with complaints of worsening speech despite ongoing speech therapy and altered mental status. Found to be septic 2/2 PNA then developed AF RVR.   Assessment & Plan    1. Atrial fibrillation with rapid ventricular response -Presumable new for patient, although she did have a CVA in 07/2016 which could be related. Unable to see records from that event in [place] -Has been transitioned to oral diltiazem 120mg  BID, rate improved between 90-100.  -Echo showed normal LV function and normal atrium. -This patients CHA2DS2-VASc Score is 7 (HTN, age (2), stroke (2), female, CAD) and unadjusted Ischemic Stroke Rate (% per year) is equal to 11.2 % stroke rate/year from a score of 7. On Eliquis 2.5mg  BID. Hgb stable. Consider stopping ASA given the addition of Eliquis due to her risk of bleeding? -Given co-morbidities, rate control is a reasonable goal. She is non ambulatory and not active and cardioversion is not likely to provide significant benefit to overall health.  2. Recent CVA -Echo shows no atrial defect or patent foramen ovale -Could be related to unidentified atrial fibrillation  3. CAD S/P CABG in 2002 -EKG with T wave inversions similar to 2015 -Due to age and fragile medical health (she was previously on hospice care for failure to thrive, has improved) she is not a good candidate for ischemic workup  4. Sepsis associated CAP -Treated by IM with antibiotics and improving  5. Hypertension -Well controlled without lows,  121-150/61-90 -Stoped amlodipine and added diltiazem 120 mg daily yesterday  Signed, Reino Bellis, NP  10/17/2016, 10:18 AM   The patient was seen, examined and discussed with Reino Bellis, NP-C and I agree with the above.   83 year old female with h/o CVA in October 2017 (hospitalized in [place]), CAD S/P CABG in 2002, hypertension, dyslipidemia, stable pancreatic mass, who was admitted with complaints of worsening speech despite ongoing speech therapy and altered mental status. CT of the head was negative and pt has been treated for sepsis secondary to lobar pneumonia. She was found to be in atrial fibrillation with RVR the  entire monitoring time. She has been started on anticoagulation. CHADS-VASc 7, high risk, agree with Eliquis.  Troponins were minimally elevated but flat trend, she has normal LVEF, normal left atrial size, most probably demand ischemia sec to a-fib with RVR. Considering her age and comorbidities we will focus on rate control. She cardiovert spontaneously once sepsis/pneumonia resolves.  Her ventricular rates are now controlled, continue cardizem SR 120 mg po BID, if tolerated, switch to cardizem CD 240 mg po daily prior to discharge. Blood pressure is controlled. She doesn't appear fluid overloaded on physical exam.   Her main complain is abdominal pain, abdominal CT from 10/08/2016 showed 5.4 x 4.5 cm multi-cystic mass seen in pancreatic head. This is concerning for either serous cystadenoma or possibly mucinous cystic neoplasm. Further evaluation with endoscopic ultrasound and cyst aspiration should be considered, if not already performed. I have ordered morphine for pain control, primary team to follow.   Ena Dawley, MD 10/17/2016

## 2016-10-18 DIAGNOSIS — K829 Disease of gallbladder, unspecified: Secondary | ICD-10-CM

## 2016-10-18 DIAGNOSIS — G9340 Encephalopathy, unspecified: Secondary | ICD-10-CM

## 2016-10-18 DIAGNOSIS — Z7189 Other specified counseling: Secondary | ICD-10-CM

## 2016-10-18 DIAGNOSIS — I4891 Unspecified atrial fibrillation: Secondary | ICD-10-CM

## 2016-10-18 LAB — MAGNESIUM: Magnesium: 2 mg/dL (ref 1.7–2.4)

## 2016-10-18 LAB — CBC
HEMATOCRIT: 32.9 % — AB (ref 36.0–46.0)
Hemoglobin: 10.5 g/dL — ABNORMAL LOW (ref 12.0–15.0)
MCH: 32.1 pg (ref 26.0–34.0)
MCHC: 31.9 g/dL (ref 30.0–36.0)
MCV: 100.6 fL — AB (ref 78.0–100.0)
PLATELETS: 338 10*3/uL (ref 150–400)
RBC: 3.27 MIL/uL — ABNORMAL LOW (ref 3.87–5.11)
RDW: 15.6 % — AB (ref 11.5–15.5)
WBC: 10.3 10*3/uL (ref 4.0–10.5)

## 2016-10-18 LAB — BASIC METABOLIC PANEL
ANION GAP: 5 (ref 5–15)
BUN: 9 mg/dL (ref 6–20)
CALCIUM: 8.4 mg/dL — AB (ref 8.9–10.3)
CO2: 28 mmol/L (ref 22–32)
Chloride: 110 mmol/L (ref 101–111)
Creatinine, Ser: 0.83 mg/dL (ref 0.44–1.00)
GFR calc Af Amer: >60 mL/min (ref >60)
GLUCOSE: 100 mg/dL — AB (ref 65–99)
Potassium: 4 mmol/L (ref 3.5–5.1)
Sodium: 143 mmol/L (ref 135–145)

## 2016-10-18 MED ORDER — DILTIAZEM HCL ER COATED BEADS 240 MG PO CP24
240.0000 mg | ORAL_CAPSULE | Freq: Every day | ORAL | Status: DC
  Administered 2016-10-19 – 2016-10-20 (×2): 240 mg via ORAL
  Filled 2016-10-18 (×2): qty 1

## 2016-10-18 MED ORDER — MORPHINE SULFATE (PF) 2 MG/ML IV SOLN
2.0000 mg | INTRAVENOUS | Status: DC | PRN
  Administered 2016-10-18 – 2016-10-19 (×7): 2 mg via INTRAVENOUS
  Filled 2016-10-18 (×7): qty 1

## 2016-10-18 NOTE — Progress Notes (Signed)
PT Cancellation Note  Patient Details Name: Amanda Hubbard MRN: [id number] DOB: 03/24/1928   Cancelled Treatment:    Reason Eval/Treat Not Completed: Pain limiting ability to participate (Pt refused, nsg notified).  Will follow up as time and pt allow.   Ramond Dial 10/18/2016, 12:11 PM   Mee Hives, PT MS Acute Rehab Dept. Number: [place]

## 2016-10-18 NOTE — Care Management Note (Signed)
Case Management Note Original Note Created  By Sharin Mons, RN 10/09/2016, 12:25 PM    Patient Details  Name: Amanda Hubbard MRN: [id number] Date of Birth: 07/28/1928  Subjective/Objective:         Admitted with sepsis/ CAP, history  of CVA , coronary artery disease, status post CABG in 2002, hypertension, dyslipidemia. From home with husband. Pt has supportive family, 3 children( 2 sons, 1 daughter ).  Dot ( caregiver) states she provides  8 hrs of PCS to pt 6 days a week (Mon-Sat). Dot states pt has been bed bound  X 1 yr.          PCP: [facility]  Action/Plan: Plan is to d/c to home when medically stable. CM to f/u with disposition needs.  Expected Discharge Date:                  Expected Discharge Plan:  Home w Hospice Care  In-House Referral:     Discharge planning Services  CM Consult  Post Acute Care Choice:  Hospice Choice offered to:  Patient  DME Arranged:    DME Agency:     HH Arranged:  RN, Disease Management (active with [facility], RN and SLP) [place] Agency:  [facility]  Status of Service:  In process, will continue to follow  If discussed at Long Length of Stay Meetings, dates discussed:    Additional Comments:  Case Management Note  Patient Details  Name: Amanda Hubbard MRN: [id number] Date of Birth: April 09, 1928  Subjective/Objective:     Presents with sepsis secondary to pna, afib with rvr, conts on cardizem drip, fetnal patch, plan home with [facility] when stable, will need resume orders.  NCM gave patient the eliquis 30 day savings card, left in her room.  NCM awaiting benefit check also.               Action/Plan:   Expected Discharge Date:                  Expected Discharge Plan:  Home w Hospice Care  In-House Referral:     Discharge planning Services  CM Consult  Post Acute Care Choice:  Hospice Choice offered to:  Patient  DME Arranged:    DME Agency:     HH Arranged:  RN, Disease Management (active with [facility], RN and SLP) [place]  Agency:  [facility]  Status of Service:  In process, will continue to follow  If discussed at Long Length of Stay Meetings, dates discussed:    Additional Comments:  10/18/16- 1545- Marvetta Gibbons RN, CM- per insurance check Eliquis covered at $45/mo - referral received for Home Hospice- spoke with pt at bedside- choice offered for home Hospice- confirmed that pt had [facility] in past and states that she wants to use them again for services- Pt was active with [facility]- will notify [facility] of plan for Hospice- per pt she does not want a hospital bed and has the needed DME at home, pt is on RA and will not need 02- Referral called to [facility]- for home hospice (if pt does not meet criteria for hospice will have PC follow in the home) -- info faxed to [facility]: Olean Ree at [phone] via [place], [place], RN 10/16/2016, 3:03 PM--Presents with sepsis secondary to pna, afib with rvr, conts on cardizem drip, fetnal patch, plan home with [facility] when  stable, will need resume orders.  NCM gave patient the eliquis 30 day savings card, left in her room.  NCM awaiting benefit check also.                Dawayne Patricia, RN 10/18/2016, 3:46 PM

## 2016-10-18 NOTE — Progress Notes (Signed)
Patient Name: Amanda Hubbard Date of Encounter: 10/18/2016  Primary Cardiologist: Dr. [facility] Problem List     Principal Problem:   Sepsis [facility]) Active Problems:   Dyslipidemia   Essential hypertension   Coronary atherosclerosis   Type 2 diabetes mellitus, controlled ([place])   CAP (community acquired pneumonia)    Subjective   Denies any chest discomfort or palpitations. Does report having abdominal pain. Unsure when last BM was (last documented movement was 01/16).   Inpatient Medications    Scheduled Meds: . apixaban  2.5 mg Oral BID  . aspirin EC  81 mg Oral Daily  . atorvastatin  20 mg Oral QHS  . Chlorhexidine Gluconate Cloth  6 each Topical Q0600  . diltiazem  120 mg Oral Q12H  . fentaNYL  50 mcg Transdermal Q72H  . gabapentin  300 mg Oral TID  . mupirocin ointment  1 application Nasal BID  . polyethylene glycol  17 g Oral Daily  . senna-docusate  2 tablet Oral Daily  . sodium chloride flush  3 mL Intravenous Q12H  . sulfacetamide  1 drop Right Eye Q4H  . temazepam  30 mg Oral QHS   Continuous Infusions:  PRN Meds: acetaminophen, bisacodyl, levalbuterol, linaclotide, LORazepam, ondansetron **OR** ondansetron (ZOFRAN) IV   Vital Signs    Vitals:   10/17/16 1513 10/17/16 2035 10/18/16 0418 10/18/16 0950  BP: 125/71 130/73 132/66 135/64  Pulse: 89 (!) 101 94   Resp: 18 20    Temp: 98.4 F (36.9 C) 98.8 F (37.1 C) 98.2 F (36.8 C)   TempSrc: Oral Oral Oral   SpO2: 94% 96% 94%   Weight:   128 lb 8.5 oz (58.3 kg)   Height:       No intake or output data in the 24 hours ending 10/18/16 1001 Filed Weights   10/16/16 0427 10/17/16 0429 10/18/16 0418  Weight: 129 lb 6.6 oz (58.7 kg) 131 lb 6.3 oz (59.6 kg) 128 lb 8.5 oz (58.3 kg)    Physical Exam    GEN: Pleasant, elderly Caucasian female appearing in no acute distress.  HEENT: Grossly normal.  Neck: Supple, no JVD, carotid bruits, or masses. Cardiac: RRR, no murmurs, rubs, or gallops.  No clubbing, cyanosis, edema.  Radials/DP/PT 2+ and equal bilaterally.  Respiratory:  Respirations regular and unlabored, clear to auscultation bilaterally. GI: Soft, BS + x 4. Diffusely tender to palpitation and appears mildly distended.  MS: no deformity or atrophy. Skin: warm and dry, no rash. Neuro:  Strength and sensation are intact. Psych: AAOx3.  Normal affect.  Labs    CBC  Recent Labs  10/16/16 0528 10/18/16 0251  WBC 12.8* 10.3  HGB 11.3* 10.5*  HCT 35.0* 32.9*  MCV 99.7 100.6*  PLT 348 [id number]   Basic Metabolic Panel  Recent Labs  10/17/16 0351 10/18/16 0251  NA 144 143  K 4.1 4.0  CL 113* 110  CO2 23 28  GLUCOSE 95 100*  BUN 8 9  CREATININE 0.94 0.83  CALCIUM 8.2* 8.4*  MG 1.9 2.0    Telemetry    Atrial fibrillation, HR in 80's - 90's. Peaking into low-100's.  - Personally Reviewed  ECG    No new tracings.   Radiology    No results found.  Cardiac Studies   Echocardiogram: 10/12/2016 Study Conclusions  - Left ventricle: Small area of apical akinesis with small tunnel   from mid cavity seen only with definity images The estimated   ejection  fraction was 55%. Doppler parameters are consistent with   both elevated ventricular end-diastolic filling pressure and   elevated left atrial filling pressure. - Aortic valve: Leaflets not well seen cannot tell if trileaflet.   There was mild regurgitation. - Mitral valve: There was mild regurgitation. - Atrial septum: No defect or patent foramen ovale was identified. - Pulmonary arteries: PA peak pressure: 31 mm Hg (S).  Patient Profile     83 yo female w/ PMH of CVA (in October 2017), CAD (s/p CABG in 2002), HTN, dyslipidemia, stable pancreatic mass and diagnosis of cholecystitis 2 months ago who presented to [facility] ED on 10/08/2016 with complaints of worsening speech despite ongoing speech therapy and altered mental status. Found to be septic 2/2 PNA then developed AF RVR  Assessment & Plan     1. Atrial fibrillation with rapid ventricular response - Presumed new for the patient, although she did have a CVA in 07/2016 which could be related. Unable to see records from that event in [place]. - TSH and Free T4 WNL.  - Has been transitioned to oral Diltiazem SR 120mg  BID, rate improved in the 80's - 90's. Will transition to Diltiazem CD 240mg  daily starting tomorrow.  - Echo showed normal LV function and normal atrium. -This patients CHA2DS2-VASc Score is 7 (HTN, age (2), stroke (2), female, CAD)and unadjusted Ischemic Stroke Rate (% per year) is equal to 11.2 % stroke rate/year from a score of 7. Started on Eliquis 2.5mg  BID.  - Given co-morbidities, rate control is a reasonable goal. She is non-ambulatory and not active and cardioversion is not likely to provide significant benefit to overall health.  2. Recent CVA - Echo shows no atrial defect or patent foramen ovale - Etiology could be secondary to previously undiagnosed atrial fibrillation.  3. CAD S/P CABG in 2002 - EKG with T wave inversions similar to 2015 - troponin values have been flat at 0.06 and 0.06, likely secondary to demand ischemia in the setting of atrial fibrillation with RVR.  - Due to age and fragile medical health (she was previously on hospice care for failure to thrive, has improved) she is not a good candidate for ischemic workup.   4. Sepsis associated CAP - per admitting team  5. Hypertension - BP at 119/64 - 142/89 in the past 24 hours.  - started on Cardizem SR 120mg  daily.   6. Abdominal Pain - reports generalized abdominal pain and appears mildly distended on exam. - receiving Senokot-S.  - per admitting team.   Signed, Erma Heritage, PA  10/18/2016, 10:01 AM    The patient was seen, examined and discussed with Bernerd Pho, PA-C and I agree with the above.    83 year old female with h/o CVA in October 2017 (hospitalized in [place]), CAD S/P CABG in 2002, hypertension,  dyslipidemia, stable pancreatic mass, who was admitted with complaints of worsening speech despite ongoing speech therapy and altered mental status. CT of the head was negative and pt has been treated for sepsis secondary to lobar pneumonia. She was found to be in atrial fibrillation with RVR the entire monitoring time. She has been started on anticoagulation. CHADS-VASc 7, high risk, agree with Eliquis.  Troponins were minimally elevated but flat trend, she has normal LVEF, normal left atrial size, most probably demand ischemia sec to a-fib with RVR. Considering her age and comorbidities we will focus on rate control. She cardiovert spontaneously once sepsis/pneumonia resolves.  Her ventricular rates are now  controlled, continue cardizem SR 120 mg po BID, if tolerated, switch to cardizem CD 240 mg po daily prior to discharge. Blood pressure is controlled. She doesn't appear fluid overloaded on physical exam.   Her main complain is abdominal pain, abdominal CT from 10/08/2016 showed 5.4 x 4.5 cm multi-cystic mass seen in pancreatic head. This is concerning for either serous cystadenoma or possibly mucinous cystic neoplasm. Further evaluation with endoscopic ultrasound and cyst aspiration should be considered, if not already performed. I have ordered morphine for pain control, primary team to follow.  Ena Dawley, MD 10/18/2016

## 2016-10-18 NOTE — Consult Note (Signed)
Consultation Note Date: 10/18/2016   Patient Name: Amanda Hubbard  DOB: [birth year]  MRN: [id number]  Age / Sex: 83 y.o., female  PCP: Chipper Herb, MD Referring Physician: Robbie Lis, MD  Reason for Consultation: Disposition and Establishing goals of care  HPI/Patient Profile: 83 y.o. female  with past medical history of CVA with residual speech impairment and now bed-bound, CAD post CABG, HTN, chronic abdominal pain, and non-insulin dependent DM2 who presented to the ED with worsening speech, AMS, and left-sided facial droop and was admitted on 10/08/2016 for stroke evaluation. CT head negative. Sepsis diagnosed, attributed to PNA.  She also developed A. Fib with RVR. Work-up also showed thyroid nodules and pancreatic mass (known to GI PTA).   Clinical Assessment and Goals of Care: I met with Mrs. Kozak at her bedside. Her son Juanda Crumble was also present, and he is her HCPOA. Mrs. Casco is medically improving, and her son reports that she is back to her baseline mentation and speech ability. That said, she is not consistently clear in her responses and had difficulty fully engaging in conversation.   I discussed her medical course, and explored their expectations for the future. Mrs. Yuille expressed a strong desire to be at home and remain there. While she appreciates the care at the hospital, she derives the most joy from being surrounded by family at home, and does not feel repeat hospitalizations or invasive interventions will enable/support that goal. Juanda Crumble also agrees. He reports that his mother had been in Hospice care previously, then graduated from their support. He remains focused on promoting quality of life over duration, and does not plan for her to return to the hospital, or have work-up of either the thyroid nodules nor pancreatic mass. He is hopeful she will now qualify for Hospice, as their philosophy of care aligns with his, and  he recognizes the frailty of his mother and the support she will need to stay comfortable and at home in the near future. We did discuss the importance of having her evaluated to ensure she qualifies; if she does not, he is open to having palliative care at home with the plan to transition to Hospice if she declines.   Primary Decision [employer], her son Juanda Crumble.   SUMMARY OF RECOMMENDATIONS    DNR/DNI; continue to optimize health with medications and discharge home with Hospice once medically stable  CM consulted to help facilitate Hospice evaluation  Code Status/Advance Care Planning:  DNR  Symptom Management:   Pt endorses RLQ abdominal pain. Per her son, this is a chronic pain that has been present for at least three years, with work-up non-revealing. The pain is being managed by her PCP, who she trusts and wishes to continue to see.   Additional Recommendations (Limitations, Scope, Preferences):  Overall focus of care should be comfort/improving quality of life. Medication management of issues, such as A. Fib is reasonable. She would not want invasive procedures or aggressive interventions.   Psycho-social/Spiritual:   Desire for further Chaplaincy support:no  Additional Recommendations: Education on Hospice  Prognosis:   < 6 months  Discharge Planning: Home with Hospice      Primary Diagnoses: Present on Admission: . Essential hypertension . Coronary atherosclerosis . Dyslipidemia   I have reviewed the medical record, interviewed the patient and family, and examined the patient. The following aspects are pertinent.  Past Medical History:  Diagnosis Date  . CAD (coronary artery disease)    s/p CABG in 2002  .  Dyslipidemia   . Female bladder prolapse    around 2013  . Hypertension   . Insomnia   . Mitral valve prolapse   . Osteopenia   . Osteoporosis   . Pneumonia 10/09/2016   Social History   Social History  . Marital status: Married    Spouse  name: N/A  . Number of children: 3  . Years of education: N/A   Occupational History  . retired English as a second language teacher    Social History Main Topics  . Smoking status: Never Smoker  . Smokeless tobacco: Never Used  . Alcohol use No  . Drug use: No  . Sexual activity: Not Asked   Other Topics Concern  . None   Social History Narrative   Lives with husband.    Family History  Problem Relation Age of Onset  . Cancer Maternal Grandmother     ?   Scheduled Meds: . apixaban  2.5 mg Oral BID  . aspirin EC  81 mg Oral Daily  . atorvastatin  20 mg Oral QHS  . Chlorhexidine Gluconate Cloth  6 each Topical Q0600  . [START ON 10/19/2016] diltiazem  240 mg Oral Daily  . diltiazem  120 mg Oral Q12H  . fentaNYL  50 mcg Transdermal Q72H  . gabapentin  300 mg Oral TID  . mupirocin ointment  1 application Nasal BID  . polyethylene glycol  17 g Oral Daily  . senna-docusate  2 tablet Oral Daily  . sodium chloride flush  3 mL Intravenous Q12H  . sulfacetamide  1 drop Right Eye Q4H  . temazepam  30 mg Oral QHS   Continuous Infusions: PRN Meds:.acetaminophen, bisacodyl, levalbuterol, linaclotide, LORazepam, morphine injection, ondansetron **OR** ondansetron (ZOFRAN) IV Allergies  Allergen Reactions  . Bee Venom Anaphylaxis  . Penicillins Other (See Comments)   Review of Systems  Constitutional: Positive for activity change (feels weaker) and fatigue.  HENT: Negative for sore throat.   Eyes: Negative for visual disturbance.  Respiratory: Negative for shortness of breath and wheezing.   Gastrointestinal: Positive for abdominal pain (RLQ). Negative for abdominal distention, constipation, diarrhea, nausea and vomiting.  Musculoskeletal: Positive for gait problem (bed-bound at home). Negative for back pain.  Skin: Positive for pallor.  Neurological: Positive for speech difficulty (post CVA) and weakness. Negative for dizziness and headaches.  Psychiatric/Behavioral: Positive for confusion.  Negative for hallucinations and sleep disturbance. The patient is not nervous/anxious.    Physical Exam  Constitutional: She is oriented to person, place, and time. She has a sickly appearance.  Frail woman resting comfortably in bed  HENT:  Head: Normocephalic and atraumatic.  Mouth/Throat: No oropharyngeal exudate.  Eyes: EOM are normal.  Neck: Normal range of motion.  Abdominal: Soft. There is no tenderness (none to palpation).  Neurological: She is alert and oriented to person, place, and time.  Skin: Skin is warm and dry. There is pallor.  Psychiatric: She has a normal mood and affect. Thought content normal. She is slowed.  Slight memory deficits. Speech is delayed with word finding issues.     Vital Signs: BP 134/68 (BP Location: Left Arm)   Pulse 99   Temp 98.2 F (36.8 C) (Oral)   Resp 18   Ht 5' 2"  (1.575 m)   Wt 58.3 kg (128 lb 8.5 oz)   SpO2 96%   BMI 23.51 kg/m  Pain Assessment: 0-10   Pain Score: 10-Worst pain ever   SpO2: SpO2: 96 % O2 Device:SpO2: 96 % O2 Flow Rate: .  IO: Intake/output summary:  Intake/Output Summary (Last 24 hours) at 10/18/16 1502 Last data filed at 10/18/16 1249  Gross per 24 hour  Intake              350 ml  Output                0 ml  Net              350 ml    LBM: Last BM Date: 10/18/16 Baseline Weight: Weight: 56.9 kg (125 lb 7.1 oz) Most recent weight: Weight: 58.3 kg (128 lb 8.5 oz)       Time Total: 70 minutes Greater than 50%  of this time was spent counseling and coordinating care related to the above assessment and plan.  Signed by: Charlynn Court, NP Palliative Medicine Team Pager # [phone] (M-F 7a-5p) Team Phone # [phone] (Nights/Weekends)

## 2016-10-18 NOTE — Progress Notes (Signed)
Patient ID: Amanda Hubbard, female   DOB: 1928-05-31, 83 y.o.   MRN: [id number]  PROGRESS NOTE    Amanda Hubbard  [id number] DOB: 17-Dec-1927 DOA: 10/08/2016  PCP: Redge Gainer, MD   Brief Narrative:  83 year old female with history of CVA on October 2017 (hospitalized in [place]) with residual speech impairment, chronically bedbound, coronary artery disease status post CABG in 2002, hypertension, dyslipidemia who presented to ED with worsening speech despite ongoing speech therapy and altered mental status.  As per daughter, pt was acting differently over the past few days PTA. Husband also noted some left-sided facial droop and complained of left wrist pain.  Initially in the ED code stroke was called but head CT was negative and no residual weakness was noted. As per husband she was diagnosed with cholecystitis 2 months back and was awaiting outpatient surgical follow-up. In the ED pt had fever of 100.102F, was tachycardic with heart rate of 105-141, tachypneic. Chest x-ray showed retrocardiac opacity. Blood work showed elevated white count of about 20K and mild hypokalemia, mild renal insufficiency and elevated lactic acid of 2. Code sepsis was initiated and patient was given IV fluid bolus along with vancomycin and Levaquin. The 12 lead EKG showed anterolateral T-wave inversion and troponin of 0.06.  Assessment & Plan:   Principal Problem: Atrial fibrillation with RVR - New onset - CHADS2Vasc is 6 - On AC with apixaban - Rate controlled with Cardizem 120 mg PO Q 12 hours; cardio changed to 240 mg daily starting tomorrow   - 2 D EHCO with EF of 55% with elevated ventricular end diastolic filling pressure and left atrial filling pressure.  Small area of apical akinesis seen. - TSH and free T4 normal.  Mild troponin elevation - Due to demand ischemia from sepsis, pneumonia, a fib - No chest pain - On apixaban and Asprin   Abdominal pain - Possibly from pancreatic mass/ IPMN seen on  CT  - Follow up with GI outpt - Also requested palliative for goals of care   Sepsis secondary to pneumonia ([place]) - Resolved - Blood cx negative - Completed 7 days of abx   Thyroid nodule - Incidentally seen on CT angiogram. Thyroid function normal. - Thyroid ultrasound showed dominant nodules, recommends FNA. Per TR[facility] yesterday, would not plan on biopsy at present given acute illness and can be be done as outpt if pt stable.  Metabolic encephalopathy - Likely associated with sepsis, stroke  - MRI brain showed subacute infarct, recommend follow-up MRI in one month.   History of stroke with speech impairment / Dyslipidemia  - Continue aspirin and statin.  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 - Was seen by [facility] in past and recommended no further work up other than outpt follow up with GI  Left wrist pain - Had a small hematoma on presentation.  - X-ray negative for fracture but it did show severe osteoarthritis  CAD status post CABG - Stable - Continue aspirin, statin   Type 2 diabetes mellitus, controlled not on long term insulin (HCC) - Stable. A1c of 6.5  Diabetic neuropathy - Continue gabapentin   Hypokalemia and hypomagnesemia - Supplemented and WNL  Positive blood culture - 1/2 culture on admission growing staph coag negative species, likely contaminant   DVT prophylaxis: on apixaban  Code Status:DNR/DNI Family Communication: no family at the bedside this am Disposition Plan: possible D/C in am   Consultants:   Cardio  Procedures:   2 D ECHO EF 55%  Antimicrobials:   None    Subjective: No overnight events.  Objective: Vitals:   10/17/16 2035 10/18/16 0418 10/18/16 0950 10/18/16 1227  BP: 130/73 132/66 135/64 134/68  Pulse: (!) 101 94  99  Resp: 20   18  Temp: 98.8 F (37.1 C) 98.2 F (36.8 C)    TempSrc: Oral Oral    SpO2: 96% 94%  96%  Weight:  58.3 kg (128 lb 8.5 oz)     Height:        Intake/Output Summary (Last 24 hours) at 10/18/16 1315 Last data filed at 10/18/16 1249  Gross per 24 hour  Intake              350 ml  Output                0 ml  Net              350 ml   Filed Weights   10/16/16 0427 10/17/16 0429 10/18/16 0418  Weight: 58.7 kg (129 lb 6.6 oz) 59.6 kg (131 lb 6.3 oz) 58.3 kg (128 lb 8.5 oz)    Examination:  General exam: Appears calm and comfortable, no distress  Respiratory system: No wheezing, no rhonchi  Cardiovascular system: S1 & S2 heard, tachycardic Gastrointestinal system: (+) BS, non tender. Central nervous system: Oriented to self only Extremities: Symmetric 5 x 5 power. No tenderness  Skin: skin is warm and dry  Psychiatry: Mood & affect appropriate.   Data Reviewed: I have personally reviewed following labs and imaging studies  CBC:  Recent Labs Lab 10/12/16 0653 10/16/16 0528 10/18/16 0251  WBC 11.4* 12.8* 10.3  HGB 9.9* 11.3* 10.5*  HCT 29.9* 35.0* 32.9*  MCV 98.0 99.7 100.6*  PLT 302 348 [id number]   Basic Metabolic Panel:  Recent Labs Lab 10/12/16 0653 10/13/16 1051 10/14/16 0226 10/15/16 0249 10/17/16 0351 10/18/16 0251  NA 145 144 143 141 144 143  K 3.3* 3.7 3.5 4.7 4.1 4.0  CL 116* 113* 113* 115* 113* 110  CO2 21* 23 22 21* 23 28  GLUCOSE 99 123* 119* 110* 95 100*  BUN 6 7 9 11 8 9   CREATININE 0.88 0.86 0.85 0.96 0.94 0.83  CALCIUM 8.3* 8.8* 8.2* 8.2* 8.2* 8.4*  MG 1.8  --  1.9  --  1.9 2.0   GFR: Estimated Creatinine Clearance: 38.5 mL/min (by C-G formula based on SCr of 0.83 mg/dL). Liver Function Tests: No results for input(s): AST, ALT, ALKPHOS, BILITOT, PROT, ALBUMIN in the last 168 hours. No results for input(s): LIPASE, AMYLASE in the last 168 hours. No results for input(s): AMMONIA in the last 168 hours. Coagulation Profile: No results for input(s): INR, PROTIME in the last 168 hours. Cardiac Enzymes: No results for input(s): CKTOTAL, CKMB, CKMBINDEX, TROPONINI in the last  168 hours. BNP (last 3 results) No results for input(s): PROBNP in the last 8760 hours. HbA1C: No results for input(s): HGBA1C in the last 72 hours. CBG: No results for input(s): GLUCAP in the last 168 hours. Lipid Profile: No results for input(s): CHOL, HDL, LDLCALC, TRIG, CHOLHDL, LDLDIRECT in the last 72 hours. Thyroid Function Tests: No results for input(s): TSH, T4TOTAL, FREET4, T3FREE, THYROIDAB in the last 72 hours. Anemia Panel: No results for input(s): VITAMINB12, FOLATE, FERRITIN, TIBC, IRON, RETICCTPCT in the last 72 hours. Urine analysis:    Component Value Date/Time   COLORURINE YELLOW 10/13/2016 0850   APPEARANCEUR CLOUDY (  A) 10/13/2016 0850   APPEARANCEUR Clear 08/07/2016 1126   LABSPEC 1.018 10/13/2016 0850   PHURINE 5.0 10/13/2016 0850   GLUCOSEU NEGATIVE 10/13/2016 0850   HGBUR NEGATIVE 10/13/2016 0850   BILIRUBINUR NEGATIVE 10/13/2016 0850   BILIRUBINUR Negative 08/07/2016 [place] 10/13/2016 0850   PROTEINUR NEGATIVE 10/13/2016 0850   UROBILINOGEN 0.2 06/10/2008 2207   NITRITE NEGATIVE 10/13/2016 0850   LEUKOCYTESUR LARGE (A) 10/13/2016 0850   LEUKOCYTESUR Negative 08/07/2016 1126   Sepsis Labs: @LABRCNTIP (procalcitonin:4,lacticidven:4)  Blood Culture (routine x 2)     Status: None   Collection Time: 10/08/16 12:44 PM  Result Value Ref Range Status   Specimen Description BLOOD LEFT FOREARM  Final   Special Requests BOTTLES DRAWN AEROBIC AND ANAEROBIC 5CC  Final   Culture NO GROWTH 5 DAYS  Final   Report Status 10/13/2016 FINAL  Final  Blood Culture (routine x 2)     Status: Abnormal   Collection Time: 10/08/16 12:45 PM  Result Value Ref Range Status   Specimen Description BLOOD RIGHT FOREARM  Final   Special Requests BOTTLES DRAWN AEROBIC AND ANAEROBIC 5CC  Final   Culture  Setup Time   Final   Culture (A)  Final    STAPHYLOCOCCUS SPECIES (COAGULASE NEGATIVE   Report Status 10/11/2016 FINAL  Final  Blood Culture ID Panel  (Reflexed)     Status: Abnormal   Collection Time: 10/08/16 12:45 PM  Result Value Ref Range Status   Enterococcus species NOT DETECTED NOT DETECTED Final   Listeria monocytogenes NOT DETECTED NOT DETECTED Final   Staphylococcus species DETECTED (A) NOT DETECTED Final   Staphylococcus aureus NOT DETECTED NOT DETECTED Final   Methicillin resistance DETECTED (A) NOT DETECTED Final   Streptococcus species NOT DETECTED NOT DETECTED Final   Streptococcus agalactiae NOT DETECTED NOT DETECTED Final   Streptococcus pneumoniae NOT DETECTED NOT DETECTED Final   Streptococcus pyogenes NOT DETECTED NOT DETECTED Final   Acinetobacter baumannii NOT DETECTED NOT DETECTED Final   Enterobacteriaceae species NOT DETECTED NOT DETECTED Final   Enterobacter cloacae complex NOT DETECTED NOT DETECTED Final   Escherichia coli NOT DETECTED NOT DETECTED Final   Klebsiella oxytoca NOT DETECTED NOT DETECTED Final   Klebsiella pneumoniae NOT DETECTED NOT DETECTED Final   Proteus species NOT DETECTED NOT DETECTED Final   Serratia marcescens NOT DETECTED NOT DETECTED Final   Haemophilus influenzae NOT DETECTED NOT DETECTED Final   Neisseria meningitidis NOT DETECTED NOT DETECTED Final   Pseudomonas aeruginosa NOT DETECTED NOT DETECTED Final   Candida albicans NOT DETECTED NOT DETECTED Final   Candida glabrata NOT DETECTED NOT DETECTED Final   Candida krusei NOT DETECTED NOT DETECTED Final   Candida parapsilosis NOT DETECTED NOT DETECTED Final   Candida tropicalis NOT DETECTED NOT DETECTED Final  Urine culture     Status: None   Collection Time: 10/08/16  4:39 PM  Result Value Ref Range Status   Specimen Description URINE, CATHETERIZED  Final   Special Requests NONE  Final   Culture NO GROWTH  Final   Report Status 10/09/2016 FINAL  Final  Culture, Urine     Status: Abnormal   Collection Time: 10/13/16  4:36 PM  Result Value Ref Range Status   Specimen Description URINE, CLEAN CATCH  Final   Special Requests  NONE  Final   Culture >=100,000 COLONIES/mL YEAST (A)  Final   Report Status 10/14/2016 FINAL  Final  MRSA PCR Screening     Status: Abnormal  Collection Time: 10/14/16 12:07 PM  Result Value Ref Range Status   MRSA by PCR POSITIVE (A) NEGATIVE Final      Radiology Studies:  Dg Chest 1 View Result Date: 10/08/2016 Similar LEFT lung base atelectasis/ scarring. Mild cardiomegaly.   Dg Chest 2 View Result Date: 10/08/2016 Streaky left base retrocardiac atelectasis or early infiltrate. No pulmonary edema.   Dg Wrist 2 Views Left Result Date: 10/08/2016  Soft tissue swelling without acute fracture deformity or dislocation. CPPD. Moderate to severe first carpometacarpal osteoarthrosis. Electronically Signed   By: Elon Alas M.D.   On: 10/08/2016 17:15   Ct Head Wo Contrast Result Date: 10/08/2016 No evidence of pulmonary embolism. Moderate bilateral pleural effusions and dependent atelectasis. 4 mm indeterminate left lower lobe pulmonary nodule. No follow-up needed if patient is low-risk. Non-contrast chest CT can be considered in 12 months if patient is high-risk. This recommendation follows the consensus statement: Guidelines for Management of Incidental Pulmonary Nodules Detected on CT Images: From the Fleischner Society 2017; Radiology 2017; 284:228-243. 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 2.8 cm left thyroid lobe nodule. Thyroid ultrasound recommended for further evaluation. This follows ACR consensus guidelines: Managing Incidental Thyroid Nodules Detected on Imaging: White Paper of the ACR Incidental Thyroid Findings Committee. J Am Coll Radiol 2015; 12:143-150. Small hiatal hernia. Electronically Signed   By: Earle Gell M.D.   On: 10/11/2016 17:04   Mr Brain Wo Contrast Result Date: 10/08/2016 No acute intracranial process on this motion degraded examination. LEFT temporal lobe subacute infarct, less likely infiltrative tumor.  Recommend 1 month follow-up. Stable moderate to severe chronic small vessel ischemic disease, old small RIGHT MCA territory infarct and old cerebellar infarcts. Electronically Signed   By: Elon Alas M.D.   On: 10/08/2016 19:59   Ct Abdomen Pelvis W Contrast Result Date: 10/08/2016 Distended gallbladder were cholelithiasis, but no evidence of cholecystitis. Mild intrahepatic and extrahepatic biliary dilatation is noted which was present on prior exam. Correlation with liver function tests is recommended to rule out biliary obstruction. Status post right nephrectomy. Aortic atherosclerosis. 5.4 x 4.5 cm multi-cystic mass seen in pancreatic head. This is concerning for either serous cystadenoma or possibly mucinous cystic neoplasm. Further evaluation with endoscopic ultrasound and cyst aspiration should be considered, if not already performed. Electronically Signed   By: Marijo Conception, M.D.   On: 10/08/2016 15:24   US Renal Result Date: 10/13/2016  1. Right nephrectomy. 2. 1.9 cm left renal cyst.  No acute left renal abnormality. 3. Right pleural effusion. 4. Stones and sludge in the gallbladder. Electronically Signed   By: Dorise Bullion III M.D   On: 10/13/2016 09:58   Dg Abd 2 Views Addendum Date: 10/14/2016   ADDENDUM REPORT: 10/14/2016 17:48 ADDENDUM: Patient reportedly has not had a cholecystectomy. The surgical vascular clips in the right quadrant reflect changes from a right nephrectomy. Electronically Signed   By: Lajean Manes M.D.   On: 10/14/2016 17:48  Result Date: 10/14/2016  1. No evidence of bowel obstruction. 2. Mild increased stool in the right colon mild right colon distention. 3. No free air. Electronically Signed: By: Lajean Manes M.D. On: 10/14/2016 17:10   US Thyroid Result Date: 10/12/2016 Right and left dominant nodules meet criteria for fine needle aspiration biopsy. 1.4 cm right lower pole nodule meets criteria for annual follow-up. The above is in keeping with the ACR  TI-RADS recommendations - J Am Coll Radiol 2017;14:587-595. Electronically Signed  By: Marybelle Killings M.D.   On: 10/12/2016 11:17    Scheduled Meds: . apixaban  2.5 mg Oral BID  . aspirin EC  81 mg Oral Daily  . atorvastatin  20 mg Oral QHS  . Chlorhexidine Gluconate Cloth  6 each Topical Q0600  . [START ON 10/19/2016] diltiazem  240 mg Oral Daily  . diltiazem  120 mg Oral Q12H  . fentaNYL  50 mcg Transdermal Q72H  . gabapentin  300 mg Oral TID  . mupirocin ointment  1 application Nasal BID  . polyethylene glycol  17 g Oral Daily  . senna-docusate  2 tablet Oral Daily  . sodium chloride flush  3 mL Intravenous Q12H  . sulfacetamide  1 drop Right Eye Q4H  . temazepam  30 mg Oral QHS   Continuous Infusions:   LOS: 10 days    Time spent: 15 minutes  Greater than 50% of the time spent on counseling and coordinating the care.   Leisa Lenz, MD [facility] Pager [phone]  If 7PM-7AM, please contact night-coverage [email or web address] Password [email or web address] 10/18/2016, 1:15 PM

## 2016-10-18 NOTE — Progress Notes (Signed)
PT Cancellation Note  Patient Details Name: Amanda Hubbard MRN: [id number] DOB: 12/30/1927   Cancelled Treatment:    Reason Eval/Treat Not Completed: Pain limiting ability to participate (second attempt). Pt and her family visiting agreed to have PT check in the AM.  Nursing reporting a great deal of problem getting pain to decrease with her pancreatic mass.   Ramond Dial 10/18/2016, 2:34 PM   Mee Hives, PT MS Acute Rehab Dept. Number: [place] [facility] [place]

## 2016-10-19 ENCOUNTER — Inpatient Hospital Stay (HOSPITAL_COMMUNITY): Payer: PPO

## 2016-10-19 DIAGNOSIS — R52 Pain, unspecified: Secondary | ICD-10-CM

## 2016-10-19 DIAGNOSIS — Z515 Encounter for palliative care: Secondary | ICD-10-CM

## 2016-10-19 DIAGNOSIS — J9601 Acute respiratory failure with hypoxia: Secondary | ICD-10-CM

## 2016-10-19 DIAGNOSIS — R109 Unspecified abdominal pain: Secondary | ICD-10-CM

## 2016-10-19 DIAGNOSIS — R1031 Right lower quadrant pain: Secondary | ICD-10-CM

## 2016-10-19 LAB — CBC
HEMATOCRIT: 32.5 % — AB (ref 36.0–46.0)
Hemoglobin: 10.5 g/dL — ABNORMAL LOW (ref 12.0–15.0)
MCH: 32.6 pg (ref 26.0–34.0)
MCHC: 32.3 g/dL (ref 30.0–36.0)
MCV: 100.9 fL — AB (ref 78.0–100.0)
PLATELETS: 319 10*3/uL (ref 150–400)
RBC: 3.22 MIL/uL — AB (ref 3.87–5.11)
RDW: 15.6 % — ABNORMAL HIGH (ref 11.5–15.5)
WBC: 12.6 10*3/uL — ABNORMAL HIGH (ref 4.0–10.5)

## 2016-10-19 MED ORDER — LEVALBUTEROL HCL 1.25 MG/0.5ML IN NEBU: 1.2500 mg | INHALATION_SOLUTION | RESPIRATORY_TRACT | Status: DC

## 2016-10-19 MED ORDER — IPRATROPIUM BROMIDE 0.02 % IN SOLN
0.5000 mg | Freq: Two times a day (BID) | RESPIRATORY_TRACT | Status: DC
  Administered 2016-10-20: 0.5 mg via RESPIRATORY_TRACT
  Filled 2016-10-19: qty 2.5

## 2016-10-19 MED ORDER — IPRATROPIUM BROMIDE 0.02 % IN SOLN
0.5000 mg | RESPIRATORY_TRACT | Status: DC
  Administered 2016-10-19: 0.5 mg via RESPIRATORY_TRACT
  Filled 2016-10-19: qty 2.5

## 2016-10-19 MED ORDER — IPRATROPIUM BROMIDE 0.02 % IN SOLN: 0.5000 mg | RESPIRATORY_TRACT | Status: DC

## 2016-10-19 MED ORDER — ALUM & MAG HYDROXIDE-SIMETH 200-200-20 MG/5ML PO SUSP
15.0000 mL | ORAL | Status: DC | PRN
  Administered 2016-10-19: 15 mL via ORAL
  Filled 2016-10-19: qty 30

## 2016-10-19 MED ORDER — LEVALBUTEROL HCL 1.25 MG/0.5ML IN NEBU
1.2500 mg | INHALATION_SOLUTION | Freq: Two times a day (BID) | RESPIRATORY_TRACT | Status: DC
  Administered 2016-10-20: 1.25 mg via RESPIRATORY_TRACT
  Filled 2016-10-19: qty 0.5

## 2016-10-19 MED ORDER — LEVALBUTEROL HCL 1.25 MG/0.5ML IN NEBU
1.2500 mg | INHALATION_SOLUTION | RESPIRATORY_TRACT | Status: DC
  Administered 2016-10-19: 1.25 mg via RESPIRATORY_TRACT
  Filled 2016-10-19: qty 0.5

## 2016-10-19 MED ORDER — MORPHINE SULFATE (CONCENTRATE) 10 MG/0.5ML PO SOLN
5.0000 mg | ORAL | Status: DC | PRN
  Administered 2016-10-19 – 2016-10-20 (×2): 10 mg via ORAL
  Filled 2016-10-19 (×2): qty 0.5

## 2016-10-19 MED ORDER — IPRATROPIUM BROMIDE 0.02 % IN SOLN: 0.5000 mg | Freq: Four times a day (QID) | RESPIRATORY_TRACT | Status: DC | PRN

## 2016-10-19 NOTE — Progress Notes (Signed)
Patient ID: Amanda Hubbard, female   DOB: 1928/07/02, 83 y.o.   MRN: [id number]  PROGRESS NOTE    Amanda Hubbard  [id number] DOB: 30-Jul-1928 DOA: 10/08/2016  PCP: Redge Gainer, MD   Brief Narrative:  83 year old female with history of CVA on October 2017 (hospitalized in [place]) with residual speech impairment, chronically bedbound, coronary artery disease status post CABG in 2002, hypertension, dyslipidemia who presented to ED with worsening speech despite ongoing speech therapy and altered mental status.  As per daughter, pt was acting differently over the past few days PTA. Husband also noted some left-sided facial droop and complained of left wrist pain.  Initially in the ED code stroke was called but head CT was negative and no residual weakness was noted. As per husband she was diagnosed with cholecystitis 2 months back and was awaiting outpatient surgical follow-up. In the ED pt had fever of 100.29F, was tachycardic with heart rate of 105-141, tachypneic. Chest x-ray showed retrocardiac opacity. Blood work showed elevated white count of about 20K and mild hypokalemia, mild renal insufficiency and elevated lactic acid of 2. Code sepsis was initiated and patient was given IV fluid bolus along with vancomycin and Levaquin. The 12 lead EKG showed anterolateral T-wave inversion and troponin of 0.06.  Assessment & Plan:   Principal Problem: Atrial fibrillation with RVR - New onset - CHADS2Vasc is 6 - On AC with apixaban - Rate controlled with Cardizem 120 mg PO Q 12 hours; cardio changed to 240 mg daily starting tomorrow   - 2 D EHCO with EF of 55% with elevated ventricular end diastolic filling pressure and left atrial filling pressure.  Small area of apical akinesis seen. - TSH and free T4 normal.  Mild troponin elevation - Due to demand ischemia from sepsis, pneumonia, a fib - No chest pain - On apixaban and Asprin   Abdominal pain - Possibly from pancreatic mass/ IPMN seen on  CT  - Follow up with GI outpt - Palliative has seen pt in consultation, family prefers pt goes home with hospice once stable   Sepsis secondary to pneumonia ([place]) - Resolved - Blood cx negative - Completed 7 days of abx  - Again leukocytosis of 12.6 this am but no fever - Will continue to monitor   Acute respiratory failure with hypoxia - More wheezing this am - Obtain CXR - Added atrovent and xopenex every 4 hours sch and every 6 hours PRN shortness of breath or wheezing   Thyroid nodule - Incidentally seen on CT angiogram. Thyroid function normal. - Thyroid ultrasound showed dominant nodules, recommends FNA. Per [facility] yesterday, would not plan on biopsy at present given acute illness and can be be done as outpt if pt stable.  Metabolic encephalopathy - Likely associated with sepsis, stroke  - MRI brain showed subacute infarct, recommend follow-up MRI in one month.   History of stroke with speech impairment / Dyslipidemia  - Continue aspirin and statin.  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 - Was seen by [facility] in past and recommended no further work up other than outpt follow up with GI  Left wrist pain - Had a small hematoma on presentation.  - X-ray negative for fracture but it did show severe osteoarthritis  CAD status post CABG - Stable - Continue aspirin, statin   Type 2 diabetes mellitus, controlled not on long term insulin (HCC) - Stable. A1c of 6.5  Diabetic neuropathy -  Continue gabapentin   Hypokalemia and hypomagnesemia - Supplemented and WNL  Positive blood culture - 1/2 culture on admission growing staph coag negative species, likely contaminant   DVT prophylaxis: on apixaban  Code Status:DNR/DNI Family Communication: no family at the bedside this am; left message on her son VM cell phone with my cell# to call for updates  Disposition Plan: home once resp status stable     Consultants:   Cardio   Procedures:   2 D ECHO EF 55%  Antimicrobials:   None    Subjective: No overnight events.  Objective: Vitals:   10/18/16 1227 10/18/16 1959 10/19/16 0539 10/19/16 1204  BP: 134/68 (!) 146/69 133/86   Pulse: 99 93 84   Resp: 18 20 20    Temp:  98.7 F (37.1 C) 98.4 F (36.9 C)   TempSrc:  Oral Oral   SpO2: 96% 95% 95% 100%  Weight:   56.1 kg (123 lb 10.9 oz)   Height:       No intake or output data in the 24 hours ending 10/19/16 1355 Filed Weights   10/17/16 0429 10/18/16 0418 10/19/16 0539  Weight: 59.6 kg (131 lb 6.3 oz) 58.3 kg (128 lb 8.5 oz) 56.1 kg (123 lb 10.9 oz)    Examination:  General exam: no distress  Respiratory system: wheezing this am, no rhonchi  Cardiovascular system: S1 & S2 heard, tachycardic Gastrointestinal system: (+) BS, non tender, non distended  Central nervous system: Oriented to self only Extremities: No tenderness, palpable pulses  Skin: no lesions or ulcers  Psychiatry: Normal mood and behavior    Data Reviewed: I have personally reviewed following labs and imaging studies  CBC:  Recent Labs Lab 10/16/16 0528 10/18/16 0251 10/19/16 0334  WBC 12.8* 10.3 12.6*  HGB 11.3* 10.5* 10.5*  HCT 35.0* 32.9* 32.5*  MCV 99.7 100.6* 100.9*  PLT 348 338 [id number]   Basic Metabolic Panel:  Recent Labs Lab 10/13/16 1051 10/14/16 0226 10/15/16 0249 10/17/16 0351 10/18/16 0251  NA 144 143 141 144 143  K 3.7 3.5 4.7 4.1 4.0  CL 113* 113* 115* 113* 110  CO2 23 22 21* 23 28  GLUCOSE 123* 119* 110* 95 100*  BUN 7 9 11 8 9   CREATININE 0.86 0.85 0.96 0.94 0.83  CALCIUM 8.8* 8.2* 8.2* 8.2* 8.4*  MG  --  1.9  --  1.9 2.0   GFR: Estimated Creatinine Clearance: 38.5 mL/min (by C-G formula based on SCr of 0.83 mg/dL). Liver Function Tests: No results for input(s): AST, ALT, ALKPHOS, BILITOT, PROT, ALBUMIN in the last 168 hours. No results for input(s): LIPASE, AMYLASE in the last 168 hours. No results for  input(s): AMMONIA in the last 168 hours. Coagulation Profile: No results for input(s): INR, PROTIME in the last 168 hours. Cardiac Enzymes: No results for input(s): CKTOTAL, CKMB, CKMBINDEX, TROPONINI in the last 168 hours. BNP (last 3 results) No results for input(s): PROBNP in the last 8760 hours. HbA1C: No results for input(s): HGBA1C in the last 72 hours. CBG: No results for input(s): GLUCAP in the last 168 hours. Lipid Profile: No results for input(s): CHOL, HDL, LDLCALC, TRIG, CHOLHDL, LDLDIRECT in the last 72 hours. Thyroid Function Tests: No results for input(s): TSH, T4TOTAL, FREET4, T3FREE, THYROIDAB in the last 72 hours. Anemia Panel: No results for input(s): VITAMINB12, FOLATE, FERRITIN, TIBC, IRON, RETICCTPCT in the last 72 hours. Urine analysis:    Component Value Date/Time   COLORURINE YELLOW 10/13/2016 0850   APPEARANCEUR CLOUDY (  A) 10/13/2016 0850   APPEARANCEUR Clear 08/07/2016 1126   LABSPEC 1.018 10/13/2016 0850   PHURINE 5.0 10/13/2016 0850   GLUCOSEU NEGATIVE 10/13/2016 0850   HGBUR NEGATIVE 10/13/2016 0850   BILIRUBINUR NEGATIVE 10/13/2016 0850   BILIRUBINUR Negative 08/07/2016 [place] 10/13/2016 0850   PROTEINUR NEGATIVE 10/13/2016 0850   UROBILINOGEN 0.2 06/10/2008 2207   NITRITE NEGATIVE 10/13/2016 0850   LEUKOCYTESUR LARGE (A) 10/13/2016 0850   LEUKOCYTESUR Negative 08/07/2016 1126   Sepsis Labs: @LABRCNTIP (procalcitonin:4,lacticidven:4)  Blood Culture (routine x 2)     Status: None   Collection Time: 10/08/16 12:44 PM  Result Value Ref Range Status   Specimen Description BLOOD LEFT FOREARM  Final   Special Requests BOTTLES DRAWN AEROBIC AND ANAEROBIC 5CC  Final   Culture NO GROWTH 5 DAYS  Final   Report Status 10/13/2016 FINAL  Final  Blood Culture (routine x 2)     Status: Abnormal   Collection Time: 10/08/16 12:45 PM  Result Value Ref Range Status   Specimen Description BLOOD RIGHT FOREARM  Final   Special Requests  BOTTLES DRAWN AEROBIC AND ANAEROBIC 5CC  Final   Culture  Setup Time   Final   Culture (A)  Final    STAPHYLOCOCCUS SPECIES (COAGULASE NEGATIVE   Report Status 10/11/2016 FINAL  Final  Blood Culture ID Panel (Reflexed)     Status: Abnormal   Collection Time: 10/08/16 12:45 PM  Result Value Ref Range Status   Enterococcus species NOT DETECTED NOT DETECTED Final   Listeria monocytogenes NOT DETECTED NOT DETECTED Final   Staphylococcus species DETECTED (A) NOT DETECTED Final   Staphylococcus aureus NOT DETECTED NOT DETECTED Final   Methicillin resistance DETECTED (A) NOT DETECTED Final   Streptococcus species NOT DETECTED NOT DETECTED Final   Streptococcus agalactiae NOT DETECTED NOT DETECTED Final   Streptococcus pneumoniae NOT DETECTED NOT DETECTED Final   Streptococcus pyogenes NOT DETECTED NOT DETECTED Final   Acinetobacter baumannii NOT DETECTED NOT DETECTED Final   Enterobacteriaceae species NOT DETECTED NOT DETECTED Final   Enterobacter cloacae complex NOT DETECTED NOT DETECTED Final   Escherichia coli NOT DETECTED NOT DETECTED Final   Klebsiella oxytoca NOT DETECTED NOT DETECTED Final   Klebsiella pneumoniae NOT DETECTED NOT DETECTED Final   Proteus species NOT DETECTED NOT DETECTED Final   Serratia marcescens NOT DETECTED NOT DETECTED Final   Haemophilus influenzae NOT DETECTED NOT DETECTED Final   Neisseria meningitidis NOT DETECTED NOT DETECTED Final   Pseudomonas aeruginosa NOT DETECTED NOT DETECTED Final   Candida albicans NOT DETECTED NOT DETECTED Final   Candida glabrata NOT DETECTED NOT DETECTED Final   Candida krusei NOT DETECTED NOT DETECTED Final   Candida parapsilosis NOT DETECTED NOT DETECTED Final   Candida tropicalis NOT DETECTED NOT DETECTED Final  Urine culture     Status: None   Collection Time: 10/08/16  4:39 PM  Result Value Ref Range Status   Specimen Description URINE, CATHETERIZED  Final   Special Requests NONE  Final   Culture NO GROWTH  Final    Report Status 10/09/2016 FINAL  Final  Culture, Urine     Status: Abnormal   Collection Time: 10/13/16  4:36 PM  Result Value Ref Range Status   Specimen Description URINE, CLEAN CATCH  Final   Special Requests NONE  Final   Culture >=100,000 COLONIES/mL YEAST (A)  Final   Report Status 10/14/2016 FINAL  Final  MRSA PCR Screening     Status: Abnormal  Collection Time: 10/14/16 12:07 PM  Result Value Ref Range Status   MRSA by PCR POSITIVE (A) NEGATIVE Final      Radiology Studies:  Dg Chest 1 View Result Date: 10/08/2016 Similar LEFT lung base atelectasis/ scarring. Mild cardiomegaly.   Dg Chest 2 View Result Date: 10/08/2016 Streaky left base retrocardiac atelectasis or early infiltrate. No pulmonary edema.   Dg Wrist 2 Views Left Result Date: 10/08/2016  Soft tissue swelling without acute fracture deformity or dislocation. CPPD. Moderate to severe first carpometacarpal osteoarthrosis. Electronically Signed   By: Elon Alas M.D.   On: 10/08/2016 17:15   Ct Head Wo Contrast Result Date: 10/08/2016 No evidence of pulmonary embolism. Moderate bilateral pleural effusions and dependent atelectasis. 4 mm indeterminate left lower lobe pulmonary nodule. No follow-up needed if patient is low-risk. Non-contrast chest CT can be considered in 12 months if patient is high-risk. This recommendation follows the consensus statement: Guidelines for Management of Incidental Pulmonary Nodules Detected on CT Images: From the Fleischner Society 2017; Radiology 2017; 284:228-243. 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 2.8 cm left thyroid lobe nodule. Thyroid ultrasound recommended for further evaluation. This follows ACR consensus guidelines: Managing Incidental Thyroid Nodules Detected on Imaging: White Paper of the ACR Incidental Thyroid Findings Committee. J Am Coll Radiol 2015; 12:143-150. Small hiatal hernia. Electronically Signed   By: Earle Gell  M.D.   On: 10/11/2016 17:04   Mr Brain Wo Contrast Result Date: 10/08/2016 No acute intracranial process on this motion degraded examination. LEFT temporal lobe subacute infarct, less likely infiltrative tumor. Recommend 1 month follow-up. Stable moderate to severe chronic small vessel ischemic disease, old small RIGHT MCA territory infarct and old cerebellar infarcts. Electronically Signed   By: Elon Alas M.D.   On: 10/08/2016 19:59   Ct Abdomen Pelvis W Contrast Result Date: 10/08/2016 Distended gallbladder were cholelithiasis, but no evidence of cholecystitis. Mild intrahepatic and extrahepatic biliary dilatation is noted which was present on prior exam. Correlation with liver function tests is recommended to rule out biliary obstruction. Status post right nephrectomy. Aortic atherosclerosis. 5.4 x 4.5 cm multi-cystic mass seen in pancreatic head. This is concerning for either serous cystadenoma or possibly mucinous cystic neoplasm. Further evaluation with endoscopic ultrasound and cyst aspiration should be considered, if not already performed. Electronically Signed   By: Marijo Conception, M.D.   On: 10/08/2016 15:24   US Renal Result Date: 10/13/2016  1. Right nephrectomy. 2. 1.9 cm left renal cyst.  No acute left renal abnormality. 3. Right pleural effusion. 4. Stones and sludge in the gallbladder. Electronically Signed   By: Dorise Bullion III M.D   On: 10/13/2016 09:58   Dg Abd 2 Views Addendum Date: 10/14/2016   ADDENDUM REPORT: 10/14/2016 17:48 ADDENDUM: Patient reportedly has not had a cholecystectomy. The surgical vascular clips in the right quadrant reflect changes from a right nephrectomy. Electronically Signed   By: Lajean Manes M.D.   On: 10/14/2016 17:48  Result Date: 10/14/2016  1. No evidence of bowel obstruction. 2. Mild increased stool in the right colon mild right colon distention. 3. No free air. Electronically Signed: By: Lajean Manes M.D. On: 10/14/2016 17:10   US  Thyroid Result Date: 10/12/2016 Right and left dominant nodules meet criteria for fine needle aspiration biopsy. 1.4 cm right lower pole nodule meets criteria for annual follow-up. The above is in keeping with the ACR TI-RADS recommendations - J Am Coll Radiol 2017;14:587-595. Electronically Signed  By: Marybelle Killings M.D.   On: 10/12/2016 11:17    Scheduled Meds: . apixaban  2.5 mg Oral BID  . aspirin EC  81 mg Oral Daily  . atorvastatin  20 mg Oral QHS  . Chlorhexidine Gluconate Cloth  6 each Topical Q0600  . diltiazem  240 mg Oral Daily  . fentaNYL  50 mcg Transdermal Q72H  . gabapentin  300 mg Oral TID  . polyethylene glycol  17 g Oral Daily  . senna-docusate  2 tablet Oral Daily  . sodium chloride flush  3 mL Intravenous Q12H  . sulfacetamide  1 drop Right Eye Q4H  . temazepam  30 mg Oral QHS   Continuous Infusions:   LOS: 11 days    Time spent: 15 minutes  Greater than 50% of the time spent on counseling and coordinating the care.   Leisa Lenz, MD [facility] Pager [phone]  If 7PM-7AM, please contact night-coverage [email or web address] Password [email or web address] 10/19/2016, 1:55 PM

## 2016-10-19 NOTE — Care Management Note (Signed)
Case Management Note Original Note Created  By Sharin Mons, RN 10/09/2016, 12:25 PM    Patient Details  Name: Amanda Hubbard MRN: [id number] Date of Birth: September 30, 1928  Subjective/Objective:         Admitted with sepsis/ CAP, history  of CVA , coronary artery disease, status post CABG in 2002, hypertension, dyslipidemia. From home with husband. Pt has supportive family, 3 children( 2 sons, 1 daughter ).  Dot ( caregiver) states she provides  8 hrs of PCS to pt 6 days a week (Mon-Sat). Dot states pt has been bed bound  X 1 yr.          PCP: Redge Gainer  Action/Plan: Plan is to d/c to home when medically stable. CM to f/u with disposition needs.  Expected Discharge Date:                  Expected Discharge Plan:  Home w Hospice Care  In-House Referral:     Discharge planning Services  CM Consult  Post Acute Care Choice:  Hospice Choice offered to:  Patient  DME Arranged:    DME Agency:     HH Arranged:  RN, Disease Management (active with [facility], RN and SLP) [place] Agency:  [facility]  Status of Service:  In process, will continue to follow  If discussed at Long Length of Stay Meetings, dates discussed:    Additional Comments:  Case Management Note  Patient Details  Name: Amanda Hubbard MRN: [id number] Date of Birth: 23-Apr-1928  Subjective/Objective:     Presents with sepsis secondary to pna, afib with rvr, conts on cardizem drip, fetnal patch, plan home with [facility] when stable, will need resume orders.  NCM gave patient the eliquis 30 day savings card, left in her room.  NCM awaiting benefit check also.               Action/Plan:   Expected Discharge Date:                  Expected Discharge Plan:  Home w Hospice Care  In-House Referral:     Discharge planning Services  CM Consult  Post Acute Care Choice:  Hospice Choice offered to:  Patient  DME Arranged:    DME Agency:     HH Arranged:  RN, Disease Management (active with [facility], RN and SLP) [place]  Agency:  [facility]  Status of Service:  In process, will continue to follow  If discussed at Long Length of Stay Meetings, dates discussed:    Additional Comments:  10/19/16- 1345- Valentina Gu, CM- spoke with MD- pt not stable for d/c today- maybe over the weekend- call made to [facility]- notified [facility] regarding d/c- they are following for Home Hospice- pt will transport by non-emergent EMS per conversation with pt's sonJuanda [id number] [phone])-  G[place]a Rm DNR on shadow chart, along with EMS paperwork (will have to update for d/c date)-   10/18/16- 1545- Isabelle Matt RN, CM- per insurance check Eliquis covered at $45/mo - referral received for Home Hospice- spoke with pt at bedside- choice offered for home Hospice- confirmed that pt had [facility] in past and states that she wants to use them again for services- Pt was [facility] with [facility]- will notify [facility] of plan for Hospice- per pt she does not want a hospital bed and has the needed DME at home, pt is on RA and will not need 02- Referral  called to [facility]- for home hospice (if pt does not meet criteria for hospice will have PC follow in the home) -- info faxed to [facility]: Olean Ree at [phone] via [place], [place], RN 10/16/2016, 3:03 PM--Presents with sepsis secondary to pna, afib with rvr, conts on cardizem drip, fetnal patch, plan home with [facility] when stable, will need resume orders.  NCM gave patient the eliquis 30 day savings card, left in her room.  NCM awaiting benefit check also.                Dawayne Patricia, RN 10/19/2016, 1:46 PM

## 2016-10-19 NOTE — Progress Notes (Signed)
Daily Progress Note   Patient Name: Amanda Hubbard       Date: 10/19/2016 DOB: 02-04-1928  Age: 83 y.o. MRN#: [id number] Attending Physician: Robbie Lis, MD Primary Care Physician: Redge Gainer, MD Admit Date: 10/08/2016  Reason for Consultation/Follow-up: Non pain symptom management and Pain control  Subjective: Mrs. Altemus is not as chipper or energetic compared to when I met her yesterday. This morning she is audibly wheezing and endorses full body aches, fatigue, and localized complaints including her RLQ abdominal pain and new mid-sternal burning (which I suspect is reflux as it improves with eating and sitting up-right). She did sleep well overnight, but woke up feeling "miserable and like i'm falling apart."  Length of Stay: 11  Current Medications: Scheduled Meds:  . apixaban  2.5 mg Oral BID  . aspirin EC  81 mg Oral Daily  . atorvastatin  20 mg Oral QHS  . Chlorhexidine Gluconate Cloth  6 each Topical Q0600  . diltiazem  240 mg Oral Daily  . fentaNYL  50 mcg Transdermal Q72H  . gabapentin  300 mg Oral TID  . polyethylene glycol  17 g Oral Daily  . senna-docusate  2 tablet Oral Daily  . sodium chloride flush  3 mL Intravenous Q12H  . sulfacetamide  1 drop Right Eye Q4H  . temazepam  30 mg Oral QHS    Continuous Infusions:   PRN Meds: acetaminophen, alum & mag hydroxide-simeth, bisacodyl, levalbuterol, linaclotide, LORazepam, morphine injection, morphine CONCENTRATE, ondansetron **OR** ondansetron (ZOFRAN) IV  Physical Exam         Constitutional: She has a sickly appearance.  Frail woman resting in bed.  HENT:  Head: Normocephalic and atraumatic.  Mouth/Throat: No oropharyngeal exudate.  Eyes: EOM are normal.  Neck: Normal range of motion.  Abdominal: Soft. There is no tenderness (none to palpation).    Neurological: She is alert and oriented to person and place, confused on time and situation. She did not remember me from yesterday afternoon.  Skin: Skin is warm and dry. There is pallor. Extensive bruising and very thin, fragile skin. Psychiatric:  Slight memory deficits. Speech is delayed with word finding issues. She appears anxious and mildly agitated.  Vital Signs: BP 133/86 (BP Location: Right Arm)   Pulse 84   Temp 98.4 F (36.9 C) (Oral)   Resp 20   Ht _0  (1.575 m)   Wt 56.1 kg (123 lb 10.9 oz)   SpO2 95%   BMI 22.62 kg/m  SpO2: SpO2: 95 % O2 Device: O2 Device: Not Delivered O2 Flow Rate:    Intake/output summary:  Intake/Output Summary (Last 24 hours) at 10/19/16 1038 Last data filed at 10/18/16 1249  Gross per 24 hour  Intake              230 ml  Output                0 ml  Net              230 ml   LBM: Last BM Date: 10/18/16 Baseline Weight: Weight: 56.9 kg (125 lb 7.1 oz) Most recent weight: Weight: 56.1 kg (123 lb 10.9 oz)  Patient Active  Problem List   Diagnosis Date Noted  . Atrial fibrillation with RVR ([place])   . Encephalopathy   . Goals of care, counseling/discussion   . Sepsis ([place]) 10/08/2016  . CAP (community acquired pneumonia) 10/08/2016  . Diabetes mellitus with complication ([place])   . Gallbladder disease   . Type 2 diabetes mellitus, controlled ([place]) 10/03/2016  . Urinary tract infectious disease 05/16/2016  . Senile purpura ([place]) 02/22/2015  . Dyslipidemia 12/21/2008  . Essential hypertension 12/21/2008  . Coronary atherosclerosis 12/21/2008  . Mitral valve disorder 12/21/2008  . OSTEOPENIA 12/21/2008  . INSOMNIA 12/21/2008    Palliative Care Assessment & Plan   HPI: 83 y.o. female  with past medical history of CVA with residual speech impairment and now bed-bound, CAD post CABG, HTN, chronic abdominal pain, and non-insulin dependent DM2 who presented to the ED with worsening speech, AMS, and left-sided facial droop and was admitted  on 10/08/2016 for stroke evaluation. CT head negative. Sepsis diagnosed, attributed to PNA.  She also developed A. Fib with RVR. Work-up also showed thyroid nodules and pancreatic mass (pancreatic mass known to GI PTA).   Assessment: I initially met Mrs. Crotteau and her son, Juanda Crumble, on 1/18. Juanda Crumble is her HCPOA. In that initial meeting we discussed the goals of care for Mrs. Hussey, and Juanda Crumble explained that they were focused on promoting quality of life over duration. She derives joy from being at home with family and, consequently, they do not plan for her to return to the hospital, or have work-up of either the thyroid nodules nor pancreatic mass. Juanda Crumble was very interested in Hospice, and hopeful that she will now qualify for their support.   Today, Mrs. Vasco is not feeling as well as yesterday. I am concerned she may have a new infection given her body aches, worsened fatigue, and wheezing, however she does remain afebrile and white count is not substantially elevated.   Recommendations/Plan:  DNR/DNI; continue to optimize health with medications and discharge home with Hospice once medically stable  I did notify primary team of my concerns for infection, they will evaluate  CM is working on setting up Hospice; most recent note says status of service is pending  Symptoms  Reflux: start PRN Maalox/Mylanta  Wheezing: PRN Xopenex available, care nurse asked to contact respiratory to administer  Abd pain: PRN morphine available (i've added PO in preparation for discharge); she has Fentanyl patch in place on chest  Hx of constipation and last imaging on 1/14, could consider repeat Abd X-ray to rule out constipation/obstruction; last BM documented as 1/18, but pt denies this and can't recall her last one  Goals of Care and Additional Recommendations:  Plan to medically optimize then discharge home with Hospice  Code Status:  DNR  Prognosis:   < 6 months  Discharge  Planning:  Home with Hospice  Care plan was discussed with pt and care nurse.  Thank you for allowing the Palliative Medicine Team to assist in the care of this patient.  Total time: 35 minutes    Greater than 50%  of this time was spent counseling and coordinating care related to the above assessment and plan.  Charlynn Court, NP Palliative Medicine Team [phone] pager (7a-5p) Team Phone # [phone]

## 2016-10-19 NOTE — Progress Notes (Signed)
Progress Note  Patient Name: Amanda Hubbard Date of Encounter: 10/19/2016  Primary Cardiologist: Dr. Percival Spanish  Subjective   Sleepy this AM. Reports her abdominal pain has improved (received IV Morphine yesterday). No chest discomfort or palpitations.   Inpatient Medications    Scheduled Meds: . apixaban  2.5 mg Oral BID  . aspirin EC  81 mg Oral Daily  . atorvastatin  20 mg Oral QHS  . Chlorhexidine Gluconate Cloth  6 each Topical Q0600  . diltiazem  240 mg Oral Daily  . fentaNYL  50 mcg Transdermal Q72H  . gabapentin  300 mg Oral TID  . polyethylene glycol  17 g Oral Daily  . senna-docusate  2 tablet Oral Daily  . sodium chloride flush  3 mL Intravenous Q12H  . sulfacetamide  1 drop Right Eye Q4H  . temazepam  30 mg Oral QHS   Continuous Infusions:  PRN Meds: acetaminophen, bisacodyl, levalbuterol, linaclotide, LORazepam, morphine injection, ondansetron **OR** ondansetron (ZOFRAN) IV   Vital Signs    Vitals:   10/18/16 0950 10/18/16 1227 10/18/16 1959 10/19/16 0539  BP: 135/64 134/68 (!) 146/69 133/86  Pulse:  99 93 84  Resp:  18 20 20   Temp:   98.7 F (37.1 C) 98.4 F (36.9 C)  TempSrc:   Oral Oral  SpO2:  96% 95% 95%  Weight:    123 lb 10.9 oz (56.1 kg)  Height:        Intake/Output Summary (Last 24 hours) at 10/19/16 0740 Last data filed at 10/18/16 1249  Gross per 24 hour  Intake              350 ml  Output                0 ml  Net              350 ml   Filed Weights   10/17/16 0429 10/18/16 0418 10/19/16 0539  Weight: 131 lb 6.3 oz (59.6 kg) 128 lb 8.5 oz (58.3 kg) 123 lb 10.9 oz (56.1 kg)    Telemetry    Atrial fibrillation, HR in 70's - 90's. 15 beats NSVT yesterday afternoon  - Personally Reviewed  ECG    No new tracings.   Physical Exam    GEN: Pleasant elderly Caucasian female appearing in no acute distress.  Neck: No JVD. No obvious masses. Cardiac: Irregularly irregular. No murmurs, rubs, or gallops.  Radials/DP/PT 2+ and equal  bilaterally.  Respiratory:  Unlabored breathing. Clear to auscultation bilaterally. GI: Soft, tender to palpation throughout entire abdomen, appears mildly distended. MS: no deformity; no edema Neuro:  Alert and oriented x 3.  Labs    Chemistry Recent Labs Lab 10/15/16 0249 10/17/16 0351 10/18/16 0251  NA 141 144 143  K 4.7 4.1 4.0  CL 115* 113* 110  CO2 21* 23 28  GLUCOSE 110* 95 100*  BUN 11 8 9   CREATININE 0.96 0.94 0.83  CALCIUM 8.2* 8.2* 8.4*  GFRNONAA 52* 53* >60  GFRAA >60 >60 >60  ANIONGAP 5 8 5      Hematology Recent Labs Lab 10/16/16 0528 10/18/16 0251 10/19/16 0334  WBC 12.8* 10.3 12.6*  RBC 3.51* 3.27* 3.22*  HGB 11.3* 10.5* 10.5*  HCT 35.0* 32.9* 32.5*  MCV 99.7 100.6* 100.9*  MCH 32.2 32.1 32.6  MCHC 32.3 31.9 32.3  RDW 15.6* 15.6* 15.6*  PLT 348 338 319     Radiology    No results found.  Cardiac Studies  Echocardiogram: 10/12/2016 Study Conclusions  - Left ventricle: Small area of apical akinesis with small tunnel from mid cavity seen only with definity images The estimated ejection fraction was 55%. Doppler parameters are consistent with both elevated ventricular end-diastolic filling pressure and elevated left atrial filling pressure. - Aortic valve: Leaflets not well seen cannot tell if trileaflet. There was mild regurgitation. - Mitral valve: There was mild regurgitation. - Atrial septum: No defect or patent foramen ovale was identified. - Pulmonary arteries: PA peak pressure: 31 mm Hg (S).  Patient Profile   83 yo female w/ PMH of CVA (in October 2017), CAD (s/p CABG in 2002), HTN, dyslipidemia, stable pancreatic mass and diagnosis of cholecystitis 2 months ago who presented to [facility] ED on 10/08/2016 with complaints of worsening speech despite ongoing speech therapy and altered mental status. Found to be septic 2/2 PNA then developed AF RVR  Assessment & Plan    1. Atrial fibrillation with rapid ventricular  response - Presumed new for the patient, although she did have a CVA in 07/2016 which could be related. Unable to see records from that event in [place]. - TSH and Free T4 WNL.  - HR remains well-controlled. Will transition to Diltiazem CD 240mg  daily.  - Echo showednormal LV function and normal atrium. -This patients CHA2DS2-VASc Score is 7 (HTN, age (2), stroke (2), female, CAD)and unadjusted Ischemic Stroke Rate (% per year) is equal to 11.2 % stroke rate/year from a score of 7. Started on Eliquis 2.5mg  BID.  - Given co-morbidities, rate control is a reasonable goal. She is non-ambulatory and not active and cardioversion is not likely to provide significant benefit to overall health.  2. Recent CVA - Echo shows no atrial defect or patent foramen ovale - Etiology could be secondary to previously undiagnosed atrial fibrillation.  3. CAD S/P CABG in 2002/ NSVT - EKG with T wave inversions similar to 2015 - troponin values have been flat at 0.06 and 0.06, likely secondary to demand ischemia in the setting of atrial fibrillation with RVR.  - Due to age and fragile medical health (she was previously on hospice care for failure to thrive, has improved) she is not a good candidate for ischemic workup.  - had 15 beats NSVT on 10/18/2016. K+ and Mg within normal limits.   4. Sepsis associated CAP - per admitting team  5. Hypertension - BP at 133/64 - 146/86 in the past 24 hours.  - continue current medication regimen.  6. Abdominal Pain - reports generalized abdominal pain and appears mildly distended on exam. - Abdominal CT from 10/08/2016 showed 5.4 x 4.5 cm multi-cystic mass seen in pancreatic head. Palliative Medicine following and the patient/family wish for no invasive evaluation. Being reevaluated for Hospice. - per admitting team.   Signed, Erma Heritage, PA  10/19/2016, 7:40 AM    The patient was seen, examined and discussed with Bernerd Pho, PA-C and I agree with the  above.    83 year old female with h/o CVA in October 2017 (hospitalized in [place]), CAD S/P CABG in 2002, hypertension, dyslipidemia, stable pancreatic mass, who was admitted with complaints of worsening speech despite ongoing speech therapy and altered mental status. CT of the head was negative and pt has been treated for sepsis secondary to lobar pneumonia. She was found to be in atrial fibrillation with RVR the entire monitoring time. She has been started on anticoagulation. CHADS-VASc 7, high risk, agree with Eliquis.  Troponins were minimally elevated but  flat trend, she has normal LVEF, normal left atrial size, most probably demand ischemia sec to a-fib with RVR. Considering her age and comorbidities we will focus on rate control. Her ventricular rates are nowcontrolled, continue cardizem SR 120 mg po BID, if tolerated, switch to cardizem CD 240 mg po daily prior to discharge. Blood pressure is controlled. She doesn't appear fluid overloaded on physical exam.   Her main complain is abdominal pain, abdominal CT from 10/08/2016 showed 5.4 x 4.5 cm multi-cystic mass seen in pancreatic head. She is being evaluated by palliative care. We will sign off. Call us with any questions.   Ena Dawley, MD 10/19/2016

## 2016-10-19 NOTE — Evaluation (Signed)
Physical Therapy Evaluation Patient Details Name: Amanda Hubbard MRN: [id number] DOB: Mar 05, 1928 Today's Date: 10/19/2016   History of Present Illness  Patient is an 83 yo female admitted 10/08/16 with worsening speech and AMS.  Patient with sepsis, pna, and developed Afib with RVR.    PMH:  CVA 07/2016, CAD, CABG, HTN, HLD  Clinical Impression  Patient presents with problems listed below.  Patient was bedbound per chart pta.  Attempted mobility - patient able to participate minimally due to significant pain Rt side of abdomen.  Patient to d/c home with Hospice.  Has caregiver 8 hrs/day, 6 days/week.  At her baseline functional level.  No acute PT needs identified - PT will sign off.    Follow Up Recommendations No PT follow up;Supervision/Assistance - 24 hour    Equipment Recommendations  None recommended by PT    Recommendations for Other Services       Precautions / Restrictions Precautions Precautions: Fall Restrictions Weight Bearing Restrictions: No      Mobility  Bed Mobility Overal bed mobility: Needs Assistance Bed Mobility: Rolling Rolling: Min assist         General bed mobility comments: Patient able to initiate rolling to both sides with use of bed rails.  Assist to complete rolling of hips forward onto side.  Patient declined further mobility, reporting pain in abdomen too bad to continue.  Transfers                 General transfer comment: NT - patient declined due to pain.  Ambulation/Gait                Stairs            Wheelchair Mobility    Modified Rankin (Stroke Patients Only)       Balance                                             Pertinent Vitals/Pain Pain Assessment: Faces Faces Pain Scale: Hurts whole lot Pain Location: Rt side of abdomen Pain Descriptors / Indicators: Grimacing;Guarding;Moaning Pain Intervention(s): Monitored during session;Patient requesting pain meds-RN notified     Home Living Family/patient expects to be discharged to:: Private residence Living Arrangements: Spouse/significant other Available Help at Discharge: Family;Available 24 hours/day Type of Home: House         Home Equipment: Other (comment) (Unsure) Additional Comments: Patient with difficulty providing information    Prior Function Level of Independence: Needs assistance   Gait / Transfers Assistance Needed: Per chart, patient non-ambulatory pta  ADL's / Homemaking Assistance Needed: Assist with all ADL's/personal care.        Hand Dominance        Extremity/Trunk Assessment   Upper Extremity Assessment Upper Extremity Assessment: Generalized weakness;Difficult to assess due to impaired cognition    Lower Extremity Assessment Lower Extremity Assessment: RLE deficits/detail;LLE deficits/detail;Difficult to assess due to impaired cognition RLE Deficits / Details: Strength grossly 3-/5.  Decreased ROM DF RLE Coordination: decreased gross motor LLE Deficits / Details: Strength grossly 3-/5; decreased ROM DF LLE Coordination: decreased gross motor       Communication   Communication: No difficulties  Cognition Arousal/Alertness: Awake/alert Behavior During Therapy: Flat affect Overall Cognitive Status: No family/caregiver present to determine baseline cognitive functioning                 General  Comments: Patient oriented x1 today.  States she is at [place].  Focused on pain in abdomen    General Comments      Exercises     Assessment/Plan    PT Assessment Patent does not need any further PT services  PT Problem List            PT Treatment Interventions      PT Goals (Current goals can be found in the Care Plan section)  Acute Rehab PT Goals PT Goal Formulation: All assessment and education complete, DC therapy    Frequency     Barriers to discharge        Co-evaluation               End of Session   Activity Tolerance:  Patient limited by pain Patient left: in bed;with call bell/phone within reach;with bed alarm set Nurse Communication: Patient requests pain meds         Time: [id number] PT Time Calculation (min) (ACUTE ONLY): 9 min   Charges:   PT Evaluation $PT Eval Low Complexity: 1 Procedure     PT G Codes:        Despina Pole 11-17-16, 12:36 PM Carita Pian. Sanjuana Kava, [place] Pager [phone]

## 2016-10-20 DIAGNOSIS — A021 Salmonella sepsis: Secondary | ICD-10-CM

## 2016-10-20 MED ORDER — LINACLOTIDE 72 MCG PO CAPS: 72.0000 ug | ORAL_CAPSULE | Freq: Every day | ORAL | 0 refills | Status: AC | PRN

## 2016-10-20 MED ORDER — DILTIAZEM HCL ER COATED BEADS 240 MG PO CP24: 240.0000 mg | ORAL_CAPSULE | Freq: Every day | ORAL | 0 refills | Status: AC

## 2016-10-20 MED ORDER — ALUM & MAG HYDROXIDE-SIMETH 200-200-20 MG/5ML PO SUSP: 15.0000 mL | ORAL | 0 refills | Status: AC | PRN

## 2016-10-20 MED ORDER — ASPIRIN 81 MG PO TBEC: 81.0000 mg | DELAYED_RELEASE_TABLET | Freq: Every day | ORAL | 0 refills | Status: AC

## 2016-10-20 MED ORDER — ACETAMINOPHEN 325 MG PO TABS: 650.0000 mg | ORAL_TABLET | Freq: Three times a day (TID) | ORAL | 0 refills | Status: AC | PRN

## 2016-10-20 MED ORDER — APIXABAN 2.5 MG PO TABS: 2.5000 mg | ORAL_TABLET | Freq: Two times a day (BID) | ORAL | 0 refills | Status: AC

## 2016-10-20 MED ORDER — BISACODYL 5 MG PO TBEC: 10.0000 mg | DELAYED_RELEASE_TABLET | Freq: Every day | ORAL | 0 refills | Status: AC | PRN

## 2016-10-20 MED ORDER — LEVALBUTEROL HCL 0.63 MG/3ML IN NEBU: 0.6300 mg | INHALATION_SOLUTION | Freq: Four times a day (QID) | RESPIRATORY_TRACT | 1 refills | Status: AC | PRN

## 2016-10-20 MED ORDER — IPRATROPIUM BROMIDE 0.02 % IN SOLN: 0.5000 mg | Freq: Four times a day (QID) | RESPIRATORY_TRACT | 1 refills | Status: AC | PRN

## 2016-10-20 NOTE — Care Management Note (Signed)
Case Management Note  Patient Details  Name: Amanda Hubbard MRN: [id number] Date of Birth: 1927-11-17  Subjective/Objective:   Called [facility] to provide pt with Nebulizer machine. Reggie, DME rep will provide to room.                Action/Plan:CM will sign off for now but will be available should additional discharge needs arise or disposition change.    Expected Discharge Date:  10/20/16               Expected Discharge Plan:  Home w Hospice Care  In-House Referral:     Discharge planning Services  CM Consult  Post Acute Care Choice:  Durable Medical Equipment Choice offered to:  Patient  DME Arranged:  Nebulizer/meds DME Agency:     HH Arranged:  RN, Disease Management (active with [facility], RN and SLP) [place] Agency:  [facility]  Status of Service:  Completed, signed off  If discussed at [place] of Stay Meetings, dates discussed:    Additional Comments:  Delrae Sawyers, RN 10/20/2016, 11:33 AM

## 2016-10-20 NOTE — Progress Notes (Signed)
Daily Progress Note   Patient Name: Amanda Hubbard       Date: 10/20/2016 DOB: [birth year]  Age: 83 y.o. MRN#: [id number] Attending Physician: Robbie Lis, MD Primary Care Physician: Redge Gainer, MD Admit Date: 10/08/2016  Reason for Consultation/Follow-up: Non pain symptom management and Pain control  Subjective: Amanda Hubbard looks much better today. She is no longer wheezing, feels "back to normal" with no full body aches or significant fatigue, and her suspected reflux has stopped. She still has her chronic RLQ abdominal pain, however feels it is "about normal" and did not request any additional pain medication. Her biggest concern is being able to leave today and go home.   Length of Stay: 12  Current Medications: Scheduled Meds:  . apixaban  2.5 mg Oral BID  . aspirin EC  81 mg Oral Daily  . atorvastatin  20 mg Oral QHS  . diltiazem  240 mg Oral Daily  . fentaNYL  50 mcg Transdermal Q72H  . gabapentin  300 mg Oral TID  . polyethylene glycol  17 g Oral Daily  . senna-docusate  2 tablet Oral Daily  . sodium chloride flush  3 mL Intravenous Q12H  . sulfacetamide  1 drop Right Eye Q4H  . temazepam  30 mg Oral QHS    Continuous Infusions:   PRN Meds: acetaminophen, alum & mag hydroxide-simeth, bisacodyl, ipratropium, levalbuterol, linaclotide, LORazepam, morphine injection, morphine CONCENTRATE, ondansetron **OR** ondansetron (ZOFRAN) IV  Physical Exam         Constitutional: She has a sickly appearance.  Frail woman resting in bed.  HENT:  Head: Normocephalic and atraumatic.  Mouth/Throat: No oropharyngeal exudate.  Eyes: EOM are normal.  Neck: Normal range of motion.  Abdominal: Soft. There is no tenderness (none to palpation).  Neurological: She is alert and oriented to person and place, confused on time and  situation. Skin: Skin is warm and dry. There is pallor. Extensive bruising and very thin, fragile skin. Psychiatric:  Slight memory deficits. Speech is delayed with word finding issues. She appears calm.  Vital Signs: BP (!) 110/42 (BP Location: Left Arm)   Pulse 75   Temp 98.7 F (37.1 C) (Oral)   Resp 18   Ht 5' 2"  (1.575 m)   Wt (P) 56.4 kg (124 lb 5.4 oz)   SpO2 93%   BMI (P) 22.74 kg/m  SpO2: SpO2: 93 % O2 Device: O2 Device: Not Delivered O2 Flow Rate:    Intake/output summary:   Intake/Output Summary (Last 24 hours) at 10/20/16 0834 Last data filed at 10/19/16 1357  Gross per 24 hour  Intake              120 ml  Output                0 ml  Net              120 ml   LBM: Last BM Date: 10/18/16 Baseline Weight: Weight: 56.9 kg (125 lb 7.1 oz) Most recent weight: Weight: (P) 56.4 kg (124 lb 5.4 oz)  Patient Active Problem List   Diagnosis Date Noted  . Abdominal pain   . Pain   . Palliative care  by specialist   . Acute respiratory failure with hypoxia ([place])   . Atrial fibrillation with RVR ([place])   . Encephalopathy   . Goals of care, counseling/discussion   . Sepsis ([place]) 10/08/2016  . CAP (community acquired pneumonia) 10/08/2016  . Diabetes mellitus with complication ([place])   . Gallbladder disease   . Type 2 diabetes mellitus, controlled ([place]) 10/03/2016  . Urinary tract infectious disease 05/16/2016  . Senile purpura ([place]) 02/22/2015  . Dyslipidemia 12/21/2008  . Essential hypertension 12/21/2008  . Coronary atherosclerosis 12/21/2008  . Mitral valve disorder 12/21/2008  . OSTEOPENIA 12/21/2008  . INSOMNIA 12/21/2008    Palliative Care Assessment & Plan   HPI: 83 y.o. female  with past medical history of CVA with residual speech impairment and now bed-bound, CAD post CABG, HTN, chronic abdominal pain, and non-insulin dependent DM2 who presented to the ED with worsening speech, AMS, and left-sided facial droop and was admitted on 10/08/2016 for stroke  evaluation. CT head negative. Sepsis diagnosed, attributed to PNA.  She also developed A. Fib with RVR. Work-up also showed thyroid nodules and pancreatic mass (pancreatic mass known to GI PTA).   Assessment: I initially met Amanda Hubbard and her son, Juanda Crumble, on 1/18. Juanda Crumble is her HCPOA. In that initial meeting we discussed the goals of care for Amanda Hubbard, and Juanda Crumble explained that they were focused on promoting quality of life over duration. She derives joy from being at home with family and, consequently, they do not plan for her to return to the hospital, or have work-up of either the thyroid nodules nor pancreatic mass. Juanda Crumble was very interested in Hospice, and hopeful that she will now qualify for their support.   On 1/19 Amanda Hubbard was feeling very poorly with a marked wheeze and symptoms (full body aches, fatigue) concerning for infection. She remained afebrile and the primary team adjusted her medications to help her breathing. Today, she feels better--back to her baseline--and is eager to go home.   Recommendations/Plan:  DNR/DNI; discharge home with Hospice once medically stable (I suspect she is ready to go today)  CM is working on setting up [place] and Additional Recommendations:  Plan to medically optimize then discharge home with Hospice  Code Status:  DNR  Prognosis:   < 6 months  Discharge Planning:  Home with Hospice  Care plan was discussed with pt.  Thank you for allowing the Palliative Medicine Team to assist in the care of this patient.  Total time: 15 minutes    Greater than 50%  of this time was spent counseling and coordinating care related to the above assessment and plan.  Charlynn Court, NP Palliative Medicine Team [phone] pager (7a-5p) Team Phone # [phone]

## 2016-10-20 NOTE — Discharge Summary (Addendum)
Physician Discharge Summary  Amanda Hubbard [id number] DOB: 1928/06/08 DOA: 10/08/2016  PCP: Redge Gainer, MD  Admit date: 10/08/2016 Discharge date: 10/20/2016  Recommendations for Outpatient Follow-up:  Continue nebulizer treatment every 4-6 hours as needed for shortness of breath or wheezing  Discharge Diagnoses:  Principal Problem:   Sepsis ([place]) Active Problems:   Dyslipidemia   Essential hypertension   Coronary atherosclerosis   Type 2 diabetes mellitus, controlled ([place])   CAP (community acquired pneumonia)   Atrial fibrillation with RVR ([place])   Encephalopathy   Goals of care, counseling/discussion   Abdominal pain   Pain   Palliative care by specialist   Acute respiratory failure with hypoxia [facility])    Discharge Condition: stable   Diet recommendation: as tolerated   History of present illness:  84 year old female with history of CVA on October 2017 (hospitalized in [place]) with residual speech impairment, chronically bedbound, coronary artery disease status post CABG in 2002, hypertension, dyslipidemia who presented to ED with worsening speech despite ongoing speech therapy and altered mental status.  As per daughter, pt was acting differently over the past few days PTA. Husband also noted some left-sided facial droop and complained of left wrist pain.  Initially in the ED code stroke was called but head CT was negative and no residual weakness was noted. As per husband she was diagnosed with cholecystitis 2 months back and was awaiting outpatient surgical follow-up. In the ED pt had fever of 100.80F, wastachycardic with heart rate of 105-141, tachypneic. Chest x-ray showed retrocardiac opacity. Blood work showed elevated white count of about 20K and mild hypokalemia, mild renal insufficiency and elevated lactic acid of 2. Code sepsis was initiated and patient was given IV fluid bolus along with vancomycin and Levaquin. The 12 lead EKG showed anterolateral T-wave inversion  and troponin of 0.06.    Hospital Course:  Assessment & Plan:   Principal Problem: Atrial fibrillation with RVR - New onset - CHADS2Vasc is 6 - On AC with apixaban - Rate controlled with Cardizem 240 mg daily  - 2 D EHCO with EF of 55% with elevated ventricular end diastolic filling pressure and left atrial filling pressure. Small area of apical akinesis seen. - TSH and free T4 normal.  Mild troponin elevation - Due to demand ischemia from sepsis, pneumonia, a fib - No chest pain - On apixaban and Asprin   Abdominal pain - Possibly from pancreatic mass/ IPMNseen on CT  - Follow up with GI outpt - Palliative has seen pt in consultation, family prefers pt goes home with hospice once stable   Sepsis secondary to pneumonia ([place]) - Resolved - Blood cx negative - Completed 7 days of abx  - Again leukocytosis of 12.6 this am but no fever - Will continue to monitor   Acute respiratory failure with hypoxia due to bilateral pleural effusion  - More wheezing this am - CXR with moderate bilateral pleural effusions but her resp status is much better with nebulizer treatments  and she no longer has wheezing on physical exam  - Added atrovent and xopenex every 4-6 hours PRN shortness of breath or wheezing  - No wheezing this am  Thyroid nodule - Incidentally seen on CT angiogram. Thyroid function normal. - Thyroid ultrasound showed dominant nodules, recommends FNA. Per TR[facility] yesterday, would not plan on biopsy at present given acute illness and can be be done as outpt if pt stable. - per family wanted to engage hospice at home   Acute metabolic encephalopathy /  Subacute stroke, unspecified site - Likely associated with sepsis, stroke  - MRI brain showed subacute infarct, recommend follow-up MRI in one month.   History of stroke with speech impairment / Dyslipidemia  - Continue aspirin and statin.  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 - Was seen by [facility] in past and recommended no further work up other than outpt follow up with GI  CKD stage 3 - Baseline Cr 1.18 - Cr now WNL  Left wrist pain - Had a small hematoma on presentation.  - X-ray negative for fracture but it did show severe osteoarthritis  CAD status post CABG - Stable - Continue aspirin, statin   Type 2 diabetes mellitus, controlled not on long term insulin ([facility]) - Stable. A1c of 6.5  Diabetic neuropathy - Continue gabapentin   Hypokalemia and hypomagnesemia - Supplemented and WNL  Positive blood culture - 1/2 culture on admission growing staph coag negative species, likely contaminant   DVT prophylaxis: on apixaban  Code Status:DNR/DNI Family Communication: no family at the bedside this am; spoke with her spuse over the phone they are all happy she is going home today, they have all the help needed at home to assist with her care     Consultants:   Cardio   Procedures:   2 D ECHO EF 55%  Antimicrobials:   None     Signed:  Leisa Lenz, MD  [facility] 10/20/2016, 9:51 AM  Pager #: [phone]  Time spent in minutes: less than 30 minutes  Discharge Exam: Vitals:   10/19/16 2035 10/20/16 0616  BP: (!) 144/71 (!) 110/42  Pulse: (!) 104 75  Resp: 18 18  Temp: 99.1 F (37.3 C) 98.7 F (37.1 C)   Vitals:   10/19/16 2035 10/19/16 2109 10/20/16 0616 10/20/16 0809  BP: (!) 144/71  (!) 110/42   Pulse: (!) 104  75   Resp: 18  18   Temp: 99.1 F (37.3 C)  98.7 F (37.1 C)   TempSrc: Oral  Oral   SpO2: 92% 93% 93% 93%  Weight:   56.4 kg (124 lb 5.4 oz)   Height:        General: Pt is alert, not in acute distress Cardiovascular: Regular rate and rhythm, S1/S2  Respiratory: no wheezing, no crackles, no rhonchi Abdominal: Soft, non tender, non distended, bowel sounds +, no guarding Extremities: no edema, no cyanosis, pulses palpable bilaterally DP and PT; scattered ecchymoses   Neuro: Grossly nonfocal  Discharge Instructions  Discharge Instructions    Call MD for:  persistant nausea and vomiting    Complete by:  As directed    Call MD for:  redness, tenderness, or signs of infection (pain, swelling, redness, odor or green/yellow discharge around incision site)    Complete by:  As directed    Call MD for:  severe uncontrolled pain    Complete by:  As directed    Diet - low sodium heart healthy    Complete by:  As directed    Discharge instructions    Complete by:  As directed    Continue nebulizer treatment every 4-6 hours as needed for shortness of breath or wheezing   Increase activity slowly    Complete by:  As directed      Allergies as of 10/20/2016      Reactions   Bee Venom Anaphylaxis   Penicillins Other (See Comments)      Medication  List    STOP taking these medications   ACETAMINOPHEN 8 HOUR 650 MG CR tablet Generic drug:  acetaminophen Replaced by:  acetaminophen 325 MG tablet   amLODipine 5 MG tablet Commonly known as:  NORVASC   doxycycline 100 MG tablet Commonly known as:  VIBRA-TABS   hydrochlorothiazide 12.5 MG tablet Commonly known as:  HYDRODIURIL   predniSONE 5 MG tablet Commonly known as:  DELTASONE     TAKE these medications   acetaminophen 325 MG tablet Commonly known as:  TYLENOL Take 2 tablets (650 mg total) by mouth every 8 (eight) hours as needed for mild pain. Replaces:  ACETAMINOPHEN 8 HOUR 650 MG CR tablet   alum & mag hydroxide-simeth [id number] MG/5ML suspension Commonly known as:  MAALOX/MYLANTA Take 15 mLs by mouth every 4 (four) hours as needed for indigestion or heartburn.   apixaban 2.5 MG Tabs tablet Commonly known as:  ELIQUIS Take 1 tablet (2.5 mg total) by mouth 2 (two) times daily.   aspirin 81 MG EC tablet Take 1 tablet (81 mg total) by mouth daily.   atorvastatin 20 MG tablet Commonly known as:  LIPITOR Take 1 tablet (20 mg total) by mouth daily.   bisacodyl 5 MG EC  tablet Commonly known as:  DULCOLAX Take 2 tablets (10 mg total) by mouth daily as needed for mild constipation or moderate constipation.   diltiazem 240 MG 24 hr capsule Commonly known as:  CARDIZEM CD Take 1 capsule (240 mg total) by mouth daily.   fentaNYL 50 MCG/HR Commonly known as:  DURAGESIC - dosed mcg/hr Place 1 patch (50 mcg total) onto the skin every 3 (three) days. Do not fill until Nov. 23, 2017   gabapentin 300 MG capsule Commonly known as:  NEURONTIN TAKE ONE (1) CAPSULE THREE (3) TIMES EACH DAY   ipratropium 0.02 % nebulizer solution Commonly known as:  ATROVENT Take 2.5 mLs (0.5 mg total) by nebulization every 6 (six) hours as needed for wheezing or shortness of breath.   levalbuterol 0.63 MG/3ML nebulizer solution Commonly known as:  XOPENEX Take 3 mLs (0.63 mg total) by nebulization every 6 (six) hours as needed for wheezing or shortness of breath.   linaclotide 72 MCG capsule Commonly known as:  LINZESS Take 1 capsule (72 mcg total) by mouth daily as needed (constipation).   LORazepam 1 MG tablet Commonly known as:  ATIVAN Take 1 tablet (1 mg total) by mouth 2 (two) times daily. What changed:  when to take this  reasons to take this   SENEXON-S 8.6-50 MG tablet Generic drug:  senna-docusate TAKE 4 TABLETS BY MOUTH 3 TIMES A DAY UNTIL BOWEL MOVEMENT. What changed:  See the new instructions.   sulfacetamide 10 % ophthalmic solution Commonly known as:  BLEPH-10 Place 1 drop into the right eye every 4 (four) hours.   temazepam 30 MG capsule Commonly known as:  RESTORIL Take 1 capsule (30 mg total) by mouth at bedtime.            Durable Medical Equipment        Start     Ordered   10/20/16 [phone]  For home use only DME Nebulizer machine  Once    Question:  Patient needs a nebulizer to treat with the following condition  Answer:  COPD (chronic obstructive pulmonary disease) ([place])   10/20/16 [id number]   10/20/16 0942  For home use only DME  Nebulizer/meds  Once    Question:  Patient needs a nebulizer to treat  with the following condition  Answer:  COPD (chronic obstructive pulmonary disease) ([place])   10/20/16 [id number]     Follow-up Information    Redge Gainer, MD Follow up.   Specialty:  Family Medicine Contact information: [place] [address] [phone]            The results of significant diagnostics from this hospitalization (including imaging, microbiology, ancillary and laboratory) are listed below for reference.    Significant Diagnostic Studies: Dg Chest 1 View  Result Date: 10/08/2016 CLINICAL DATA:  [place] while at home today.  LEFT wrist pain. EXAM: CHEST 1 VIEW COMPARISON:  Chest radiograph October 08, 2016 at 1031 hours FINDINGS: Cardiac silhouette is mildly enlarged unchanged. Status post median sternotomy for CABG. Mildly calcified aortic knob. Similar strandy densities projecting LEFT lung base without pleural effusion or focal consolidation. No pneumothorax. Osteopenia. Soft tissue planes are nonsuspicious; calcifications in neck are likely vascular IMPRESSION: Similar LEFT lung base atelectasis/ scarring. Mild cardiomegaly. Electronically Signed   By: Elon Alas M.D.   On: 10/08/2016 17:13   Dg Chest 2 View  Result Date: 10/08/2016 CLINICAL DATA:  Low grade temperature, possible infection EXAM: CHEST  2 VIEW COMPARISON:  07/09/2016 FINDINGS: Cardiomediastinal silhouette is stable. There is streaky left base retrocardiac atelectasis or early infiltrate. No pulmonary edema. Osteopenia and degenerative changes thoracic spine. IMPRESSION: Streaky left base retrocardiac atelectasis or early infiltrate. No pulmonary edema. Electronically Signed   By: Lahoma Crocker M.D.   On: 10/08/2016 11:46   Dg Wrist 2 Views Left  Result Date: 10/08/2016 CLINICAL DATA:  [place] while at home today.  LEFT wrist pain. EXAM: LEFT WRIST - 2 VIEW COMPARISON:  None. FINDINGS: No acute fracture deformity or dislocation.  Osteopenia without destructive bony lesions. Moderate to severe first carpometacarpal joint space narrowing, periarticular sclerosis and marginal spurring compatible with osteoarthrosis. Faint intra-articular calcifications compatible with CPPD. Dorsal wrist soft tissue swelling without subcutaneous gas or radiopaque foreign bodies. IMPRESSION: Soft tissue swelling without acute fracture deformity or dislocation. CPPD. Moderate to severe first carpometacarpal osteoarthrosis. Electronically Signed   By: Elon Alas M.D.   On: 10/08/2016 17:15   Ct Head Wo Contrast  Result Date: 10/08/2016 CLINICAL DATA:  Left facial droop. Left-sided weakness beginning last night. EXAM: CT HEAD WITHOUT CONTRAST TECHNIQUE: Contiguous axial images were obtained from the base of the skull through the vertex without intravenous contrast. COMPARISON:  07/09/2016. FINDINGS: Brain: There is atrophy and chronic small vessel disease changes. No acute intracranial abnormality. Specifically, no hemorrhage, hydrocephalus, mass lesion, acute infarction, or significant intracranial injury. Vascular: No hyperdense vessel or unexpected calcification. Skull: No acute calvarial abnormality. Sinuses/Orbits: Visualized paranasal sinuses and mastoids clear. Orbital soft tissues unremarkable. Other: None IMPRESSION: No acute intracranial abnormality. Atrophy, chronic microvascular disease. Electronically Signed   By: Rolm Baptise M.D.   On: 10/08/2016 11:35   Ct Angio Chest Pe W Or Wo Contrast  Result Date: 10/11/2016 CLINICAL DATA:  Shortness of breath and sinus tachycardia. Clinical suspicion for pulmonary embolism. EXAM: CT ANGIOGRAPHY CHEST WITH CONTRAST TECHNIQUE: Multidetector CT imaging of the chest was performed using the standard protocol during bolus administration of intravenous contrast. Multiplanar CT image reconstructions and MIPs were obtained to evaluate the vascular anatomy. CONTRAST:  100 mL Isovue 370 COMPARISON:  None.  FINDINGS: Cardiovascular: Satisfactory opacification of pulmonary arteries noted, and no pulmonary emboli identified. No evidence of thoracic aortic dissection or aneurysm. Aortic atherosclerosis. Previous coronary artery bypass grafting. Mild cardiomegaly. Left ventricular  hypertrophy. 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 Mediastinum/Nodes: No pathologically enlarged lymph nodes within the thorax. A low-attenuation nodule is seen in the left thyroid lobe measuring 2.4 x 2.8 cm. This shows nonspecific features. Lungs/Pleura: Moderate bilateral pleural effusions are seen with mild dependent atelectasis. No evidence of pulmonary consolidation or mass. 4 mm pulmonary nodule seen in anterior left lower lobe on image 87/407. Upper abdomen: Small hiatal hernia. Musculoskeletal: No suspicious bone lesions or other significant abnormality identified. Review of the MIP images confirms the above findings. IMPRESSION: No evidence of pulmonary embolism. Moderate bilateral pleural effusions and dependent atelectasis. 4 mm indeterminate left lower lobe pulmonary nodule. No follow-up needed if patient is low-risk. Non-contrast chest CT can be considered in 12 months if patient is high-risk. This recommendation follows the consensus statement: Guidelines for Management of Incidental Pulmonary Nodules Detected on CT Images: From the Fleischner Society 2017; Radiology 2017; 284:228-243. 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 2.8 cm left thyroid lobe nodule. Thyroid ultrasound recommended for further evaluation. This follows ACR consensus guidelines: Managing Incidental Thyroid Nodules Detected on Imaging: White Paper of the ACR Incidental Thyroid Findings Committee. J Am Coll Radiol 2015; 12:143-150. Small hiatal hernia. Electronically Signed   By: Earle Gell M.D.   On: 10/11/2016 17:04   Mr Brain Wo  Contrast  Result Date: 10/08/2016 CLINICAL DATA:  Encephalopathy, weakness and altered mental status. History of hypertension, hyperlipidemia. EXAM: MRI HEAD WITHOUT CONTRAST TECHNIQUE: Multiplanar, multiecho pulse sequences of the brain and surrounding structures were obtained without intravenous contrast. COMPARISON:  CT HEAD October 08, 2016 at 1118 hours and MRI of the head July 10, 2016 FINDINGS: Multiple sequences are moderately motion degraded. BRAIN: Faint reduced diffusion LEFT temporal lobe associated with confluent new FLAIR white matter T2 hyperintensities, no focal atrophy. Normalized ADC values. No susceptibility artifact to suggest hemorrhage. Old bilateral small cerebellar infarcts. Old RIGHT posterior insula infarct. Patchy to confluent supratentorial white matter FLAIR T2 hyperintensities ventricles and sulci are overall normal for patient's age. No midline shift, mass effect or abnormal extra-axial fluid collections. VASCULAR: Normal major intracranial vascular flow voids present at skull base. SKULL AND UPPER CERVICAL SPINE: No abnormal sellar expansion. No suspicious calvarial bone marrow signal. Craniocervical junction maintained. Subcentimeter probable pars intermedius cyst. SINUSES/ORBITS: RIGHT mastoid effusion. Trace paranasal sinus mucosal thickening. Status post LEFT ocular lens implant. The included ocular globes and orbital contents are non-suspicious. OTHER: None. IMPRESSION: No acute intracranial process on this motion degraded examination. LEFT temporal lobe subacute infarct, less likely infiltrative tumor. Recommend 1 month follow-up. Stable moderate to severe chronic small vessel ischemic disease, old small RIGHT MCA territory infarct and old cerebellar infarcts. Electronically Signed   By: Elon Alas M.D.   On: 10/08/2016 19:59   Ct Abdomen Pelvis W Contrast  Result Date: 10/08/2016 CLINICAL DATA:  Gallbladder disease. EXAM: CT ABDOMEN AND PELVIS WITH CONTRAST  TECHNIQUE: Multidetector CT imaging of the abdomen and pelvis was performed using the standard protocol following bolus administration of intravenous contrast. CONTRAST:  1 ISOVUE-300 IOPAMIDOL (ISOVUE-300) INJECTION 61% COMPARISON:  CT scan of January 08, 2015 and August 20, 2012. FINDINGS: Lower chest: No acute abnormality. Hepatobiliary: Distended gallbladder is noted with cholelithiasis. No focal parenchymal abnormality is noted in the liver. Mild intrahepatic and extrahepatic biliary dilatation is noted which was present on prior exam. Pancreas: 5.4 x 4.5 cm multi-cystic mass arises from the pancreatic head. Spleen: Normal in size  without focal abnormality. Adrenals/Urinary Tract: Status post right nephrectomy. Adrenal glands appear normal. Stable left renal cyst is noted. No hydronephrosis or renal obstruction is noted. Urinary bladder appears normal. Stomach/Bowel: There is no evidence of bowel obstruction. Vascular/Lymphatic: Aortic atherosclerosis. No enlarged abdominal or pelvic lymph nodes. Reproductive: Status post hysterectomy. No adnexal masses. Other: No abdominal wall hernia or abnormality. No abdominopelvic ascites. Musculoskeletal: No acute or significant osseous findings. IMPRESSION: Distended gallbladder were cholelithiasis, but no evidence of cholecystitis. Mild intrahepatic and extrahepatic biliary dilatation is noted which was present on prior exam. Correlation with liver function tests is recommended to rule out biliary obstruction. Status post right nephrectomy. Aortic atherosclerosis. 5.4 x 4.5 cm multi-cystic mass seen in pancreatic head. This is concerning for either serous cystadenoma or possibly mucinous cystic neoplasm. Further evaluation with endoscopic ultrasound and cyst aspiration should be considered, if not already performed. Electronically Signed   By: Marijo Conception, M.D.   On: 10/08/2016 15:24   US Renal  Result Date: 10/13/2016 CLINICAL DATA:  Oliguria EXAM: RENAL /  URINARY TRACT ULTRASOUND COMPLETE COMPARISON:  CT scan January 2018 FINDINGS: Right Kidney: Surgically absent Left Kidney: Length: 9.7 cm. 1.9 cm cysts. No acute abnormalities. No hydronephrosis. Bladder: Appears normal for degree of bladder distention. IMPRESSION: 1. Right nephrectomy. 2. 1.9 cm left renal cyst.  No acute left renal abnormality. 3. Right pleural effusion. 4. Stones and sludge in the gallbladder. Electronically Signed   By: Dorise Bullion III M.D   On: 10/13/2016 09:58   Dg Chest Port 1 View  Result Date: 10/19/2016 CLINICAL DATA:  Severe chest pain for 1-2 hours every day. EXAM: PORTABLE CHEST 1 VIEW COMPARISON:  Single-view of the chest 10/08/2016. CT chest 10/11/2016. FINDINGS: There is cardiomegaly. No pulmonary edema. Moderate bilateral pleural effusions and basilar atelectasis are noted. No pneumothorax. IMPRESSION: Moderate bilateral pleural effusions and basilar atelectasis. Cardiomegaly without edema. Electronically Signed   By: Inge Rise M.D.   On: 10/19/2016 15:06   Dg Abd 2 Views  Addendum Date: 10/14/2016   ADDENDUM REPORT: 10/14/2016 17:48 ADDENDUM: Patient reportedly has not had a cholecystectomy. The surgical vascular clips in the right quadrant reflect changes from a right nephrectomy. Electronically Signed   By: Lajean Manes M.D.   On: 10/14/2016 17:48   Result Date: 10/14/2016 CLINICAL DATA:  Abdominal pain. EXAM: ABDOMEN - 2 VIEW COMPARISON:  CT, 10/08/2016 FINDINGS: Mild gaseous distention of the right colon, but no evidence of bowel obstruction. Mild increased stool is noted in the right colon. Status post cholecystectomy since the CT. No free air evident on the decubitus views. IMPRESSION: 1. No evidence of bowel obstruction. 2. Mild increased stool in the right colon mild right colon distention. 3. No free air. Electronically Signed: By: Lajean Manes M.D. On: 10/14/2016 17:10   US Thyroid  Result Date: 10/12/2016 CLINICAL DATA:  Incidental on CT.  Left  thyroid nodule. EXAM: THYROID ULTRASOUND TECHNIQUE: Ultrasound examination of the thyroid gland and adjacent soft tissues was performed. COMPARISON:  None. FINDINGS: Parenchymal Echotexture: Mildly heterogenous Isthmus: 0.6 cm. Right lobe: 3.6 x 1.6 x 2.1 cm. Left lobe: 4.6 x 3.1 x 3.2 cm. _________________________________________________________ Estimated total number of nodules >/= 1 cm: 5 Number of spongiform nodules >/=  2 cm not described below (TR1): 0 Number of mixed cystic and solid nodules >/= 1.5 cm not described below (TR2): 0 Nodule # 1: Location: Right; Mid Maximum size: 1.6 cm; Other 2 dimensions: 1.5 x 1.2 cm Composition: solid/almost  completely solid (2) Echogenicity: isoechoic (1) Shape: taller-than-wide (3) Margins: smooth (0) Echogenic foci: none (0) ACR TI-RADS total points: 6. ACR TI-RADS risk category: TR4 (4-6 points). ACR TI-RADS recommendations: **Given size (>/= 1.5 cm) and appearance, fine needle aspiration of this moderately suspicious nodule should be considered based on TI-RADS criteria. Nodule # 2: Location: Right; Inferior Maximum size: 1.4 cm; Other 2 dimensions: 0.8 x 1.0 cm Composition: solid/almost completely solid (2) Echogenicity: hypoechoic (2) Shape: not taller-than-wide (0) Margins: smooth (0) Echogenic foci: none (0) ACR TI-RADS total points: 4. ACR TI-RADS risk category: TR4 (4-6 points). ACR TI-RADS recommendations: *Given size (>/= 1 - 1.4 cm) and appearance, a follow-up ultrasound in 1 year should be considered based on TI-RADS criteria. Nodule # 3: Location: Left; Superior Maximum size: 2.8 cm; Other 2 dimensions: 2.0 x 2.4 cm Composition: solid/almost completely solid (2) Echogenicity: isoechoic (1) Shape: not taller-than-wide (0) Margins: smooth (0) Echogenic foci: none (0) ACR TI-RADS total points: 3. ACR TI-RADS risk category: TR3 (3 points). ACR TI-RADS recommendations: **Given size (>/= 2.5 cm) and appearance, fine needle aspiration of this mildly suspicious  nodule should be considered based on TI-RADS criteria. Other scattered nodules are smaller and have a benign appearance. IMPRESSION: Right and left dominant nodules meet criteria for fine needle aspiration biopsy. 1.4 cm right lower pole nodule meets criteria for annual follow-up. The above is in keeping with the ACR TI-RADS recommendations - J Am Coll Radiol 2017;14:587-595. Electronically Signed   By: Marybelle Killings M.D.   On: 10/12/2016 11:17    Microbiology: Recent Results (from the past 240 hour(s))  Culture, Urine     Status: Abnormal   Collection Time: 10/13/16  4:36 PM  Result Value Ref Range Status   Specimen Description URINE, CLEAN CATCH  Final   Special Requests NONE  Final   Culture >=100,000 COLONIES/mL YEAST (A)  Final   Report Status 10/14/2016 FINAL  Final  MRSA PCR Screening     Status: Abnormal   Collection Time: 10/14/16 12:07 PM  Result Value Ref Range Status   MRSA by PCR POSITIVE (A) NEGATIVE Final    Comment:        The GeneXpert MRSA Assay (FDA approved for NASAL specimens only), is one component of a comprehensive MRSA colonization surveillance program. It is not intended to diagnose MRSA infection nor to guide or monitor treatment for MRSA infections. RESULT CALLED TO, READ BACK BY AND VERIFIED WITH: S. [facility] RN AT 1346 10/14/16 BY A.DAVIS      Labs: Basic Metabolic Panel:  Recent Labs Lab 10/13/16 1051 10/14/16 0226 10/15/16 0249 10/17/16 0351 10/18/16 0251  NA 144 143 141 144 143  K 3.7 3.5 4.7 4.1 4.0  CL 113* 113* 115* 113* 110  CO2 23 22 21* 23 28  GLUCOSE 123* 119* 110* 95 100*  BUN 7 9 11 8 9   CREATININE 0.86 0.85 0.96 0.94 0.83  CALCIUM 8.8* 8.2* 8.2* 8.2* 8.4*  MG  --  1.9  --  1.9 2.0   Liver Function Tests: No results for input(s): AST, ALT, ALKPHOS, BILITOT, PROT, ALBUMIN in the last 168 hours. No results for input(s): LIPASE, AMYLASE in the last 168 hours. No results for input(s): AMMONIA in the last 168  hours. CBC:  Recent Labs Lab 10/16/16 0528 10/18/16 0251 10/19/16 0334  WBC 12.8* 10.3 12.6*  HGB 11.3* 10.5* 10.5*  HCT 35.0* 32.9* 32.5*  MCV 99.7 100.6* 100.9*  PLT 348 338 319   Cardiac Enzymes:  No results for input(s): CKTOTAL, CKMB, CKMBINDEX, TROPONINI in the last 168 hours. BNP: BNP (last 3 results) No results for input(s): BNP in the last 8760 hours.  ProBNP (last 3 results) No results for input(s): PROBNP in the last 8760 hours.  CBG: No results for input(s): GLUCAP in the last 168 hours.

## 2016-10-20 NOTE — Discharge Instructions (Signed)
How to Use a Nebulizer, Adult A nebulizer is a device that turns liquid medicine into a mist (vapor) that you can breathe in (inhale). You may need to use a nebulizer if you have a breathing illness, such as asthma or pneumonia. There are different kinds of nebulizers. With some, you breathe in through a mouthpiece. With others, a mask fits over your nose and mouth. Risks and complications Using a nebulizer that does not fit right or is not cleaned right can lead to the following complications:  Infection.  Eye irritation.  Delivery of too much medicine or not enough medicine.  Mouth irritation. How to prepare before using a nebulizer Take these steps before using your nebulizer: 1. Check your medicine. Make sure it has not expired and is not damaged in any way. 2. Wash your hands with soap and water. 3. Put all of the parts of your nebulizer on a sturdy, flat surface. Make sure all of the tubing is connected. 4. Measure the liquid medicine according to instructions from your health care provider. Pour the liquid into the part of the nebulizer that holds the medicine (reservoir). 5. Attach the mouthpiece or mask. 6. Test the nebulizer by turning it on to make sure that a spray comes out. Then, turn it off. How to use a nebulizer 1. Sit down and relax. 2. If your nebulizer has a mask, put it over your nose and mouth. It should fit somewhat snugly, with no gaps around the nose or cheeks where medicine could escape. If you use a mouthpiece, put it in your mouth. Press your lips firmly around the mouthpiece. 3. Turn on the nebulizer. 4. Breathe out (exhale). 5. Some nebulizers have a finger valve. If yours does, cover up the air hole so the air gets to the nebulizer. 6. Once the medicine begins to mist out, take slow, deep breaths. If there is a finger valve, release it at the end of your breath. 7. Continue taking slow, deep breaths until the medicine in the nebulizer is gone and no vapor  appears. Be sure to stop the machine at any time if you start coughing or if the medicine foams or bubbles. How to clean a nebulizer The nebulizer and all of its parts must be kept very clean. If the nebulizer and its parts are not cleaned properly, bacteria can grow inside of them. If you inhale the bacteria, you can get sick. Follow the manufacturer's instructions for cleaning your nebulizer. For most nebulizers, you should follow these guidelines:  Wash the nebulizer after each use. Use warm water and soap. Make sure to wash the mouthpiece or mask and the medicine area, but do not wash the tubing or mouthpiece.  After you wash the nebulizer, place its parts on a clean towel and let them dry completely. After they dry, reconnect the pieces and turn the nebulizer on without any medicine in it. Doing this will blow air through the equipment to help dry it out.  Store the nebulizer in a clean and dust-free place.  Check the filter at least one time every week. Replace it if it looks dirty. Contact a health care provider if:  You continue to have trouble breathing.  You have trouble using the nebulizer.  Your breathing gets worse during a nebulizer treatment.  Your nebulizer stops working, foams, or does not create a mist after you add medicine and turn it on. This information is not intended to replace advice given to you by your  health care provider. Make sure you discuss any questions you have with your health care provider. Document Released: 09/05/2009 Document Revised: 05/17/2016 Document Reviewed: 03/24/2016 Elsevier Interactive Patient Education  2017 [employer].

## 2016-10-20 NOTE — Care Management Note (Signed)
Case Management Note  Patient Details  Name: Amanda Hubbard MRN: [id number] Date of Birth: [birth year]  Subjective/Objective:  Received call from Bedside RN to assist with transport, Called [facility] to arrange nonemergency transport to home @ 13:33. No other needs per daughter who is the primary caregiver. Transport form complete and given to the RN. No other CM needs that I am aware of at this time.                   Action/Plan: CM will be available for additional disposition needs.    Expected Discharge Date:  10/20/16               Expected Discharge Plan:  Home w Hospice Care  In-House Referral:     Discharge planning Services  CM Consult  Post Acute Care Choice:  Durable Medical Equipment Choice offered to:  Patient  DME Arranged:  Nebulizer/meds DME Agency:     HH Arranged:  RN, Disease Management (active with [facility], RN and SLP) [place] Agency:  [facility]  Status of Service:  Completed, signed off  If discussed at [place] of Stay Meetings, dates discussed:    Additional Comments:  Delrae Sawyers, RN 10/20/2016, 1:42 PM

## 2016-11-01 DEATH — deceased

## 2017-09-09 IMAGING — CT CT ABD-PELV W/ CM
2 of 5 series · 16 of 46 positions shown, 18 images · IV contrast (iopamidol)
Comparison: CT scan of January 08, 2015 and August 20, 2012.

CLINICAL DATA: Gallbladder disease.

EXAM:
CT ABDOMEN AND PELVIS WITH CONTRAST
TECHNIQUE: Multidetector CT imaging of the abdomen and pelvis was performed
using the standard protocol following bolus administration of
intravenous contrast.
CONTRAST:  1 ILIYMF-KNN IOPAMIDOL (ILIYMF-KNN) INJECTION 61%

[Series 2: abd/ pelvis 5.0 i30f 1 · axial · 0.78mm/px · z∈[+762,+1157]mm · 13 of 89 slices shown, 15 images]
[im 5/89  soft-tissue]
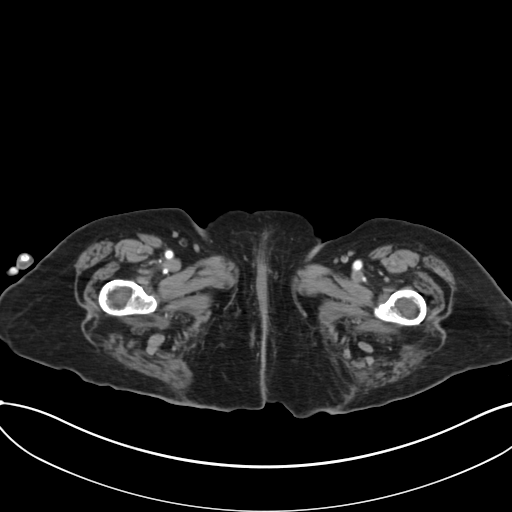
[im 5/89  bone]
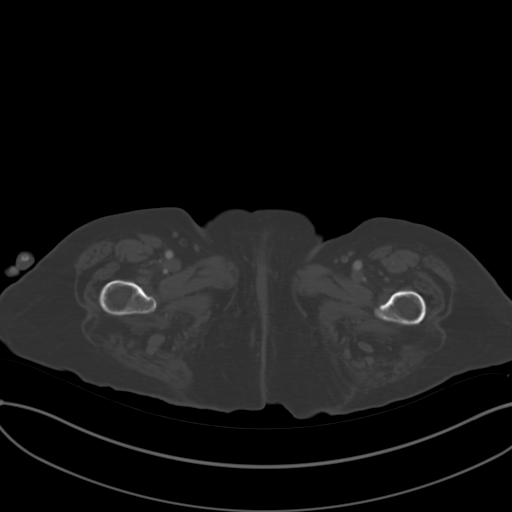
[im 14/89  soft-tissue]
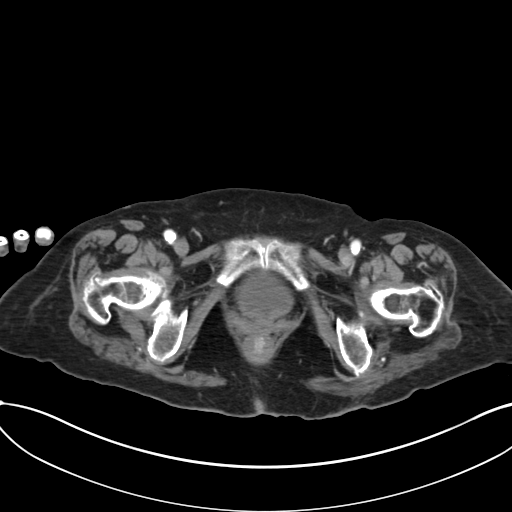
[im 19/89  soft-tissue]
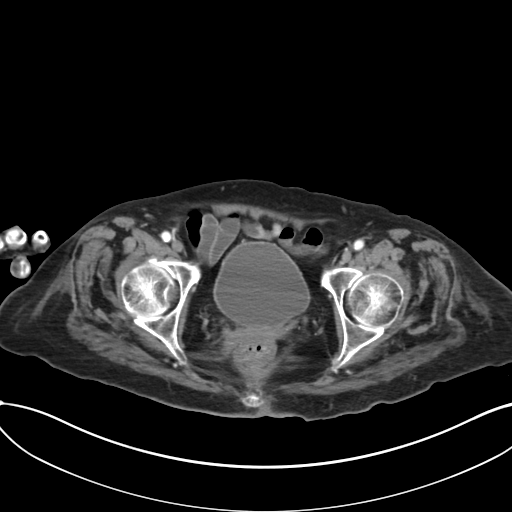
[im 24/89  soft-tissue]
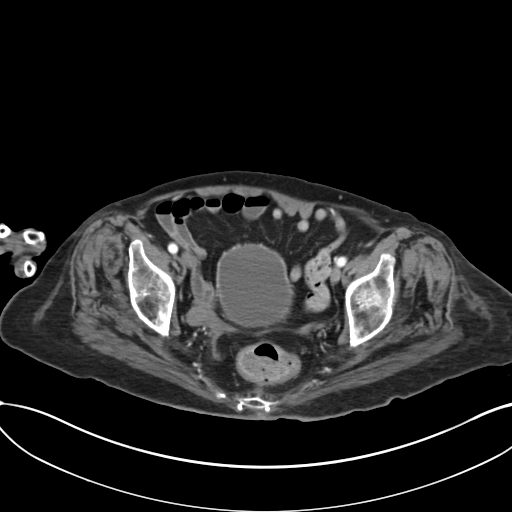
[im 33/89  soft-tissue]
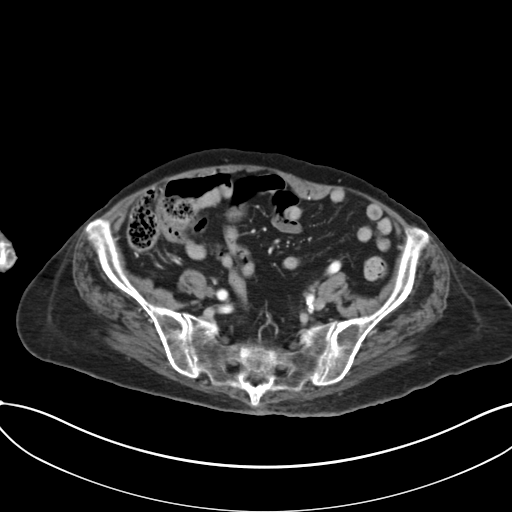
[im 38/89  soft-tissue]
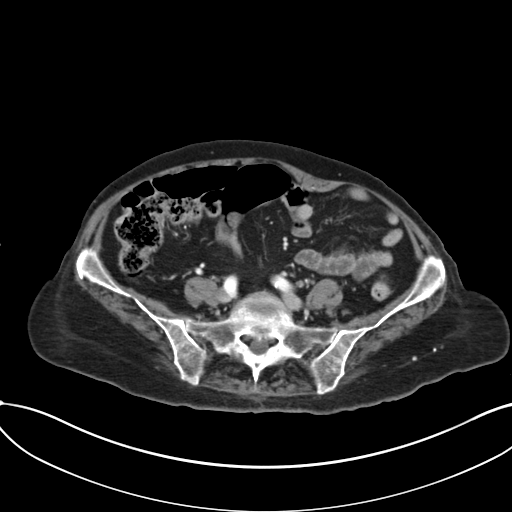
[im 47/89  soft-tissue]
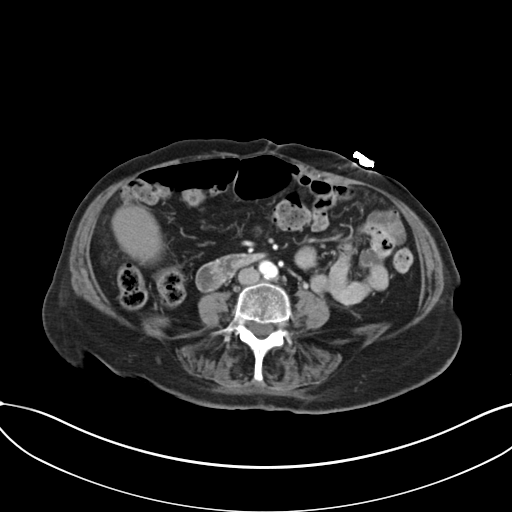
[im 51/89  soft-tissue]
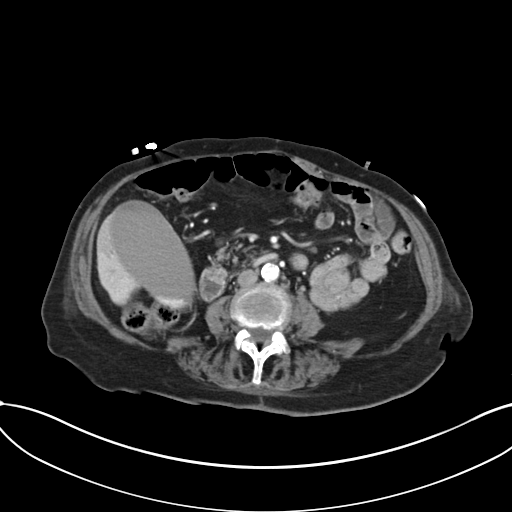
[im 56/89  soft-tissue]
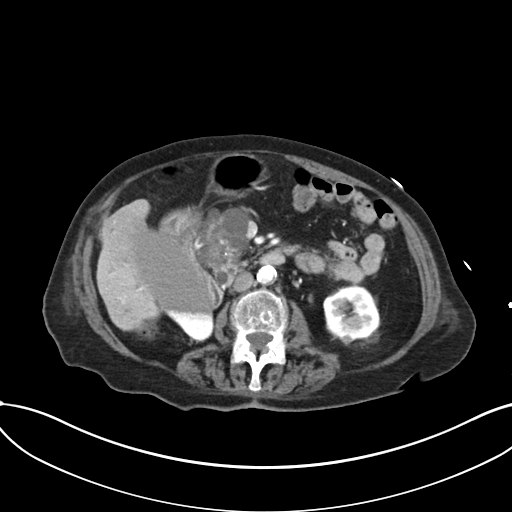
[im 56/89  bone]
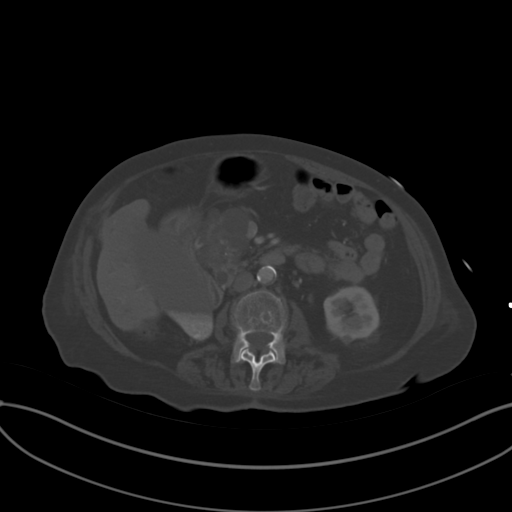
[im 65/89  soft-tissue]
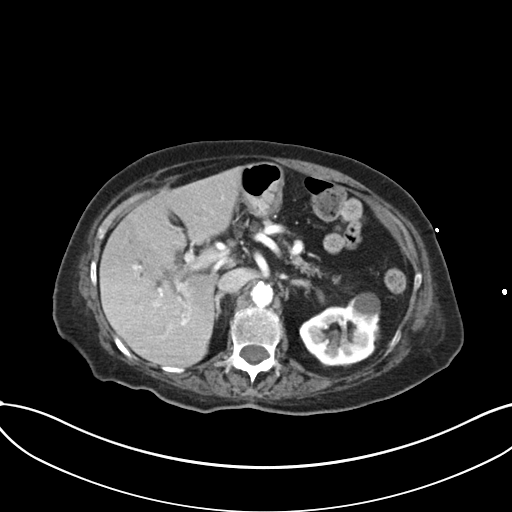
[im 70/89  soft-tissue]
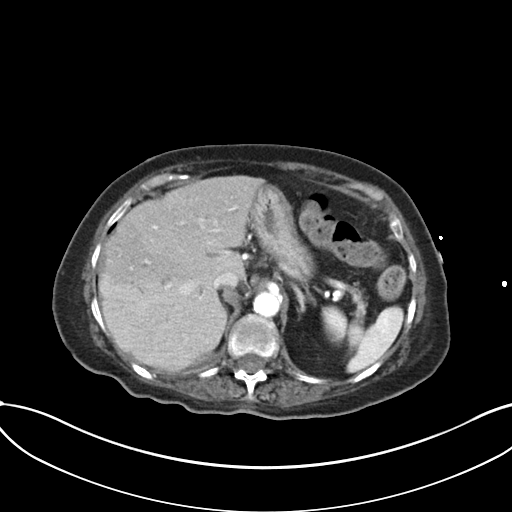
[im 75/89  soft-tissue]
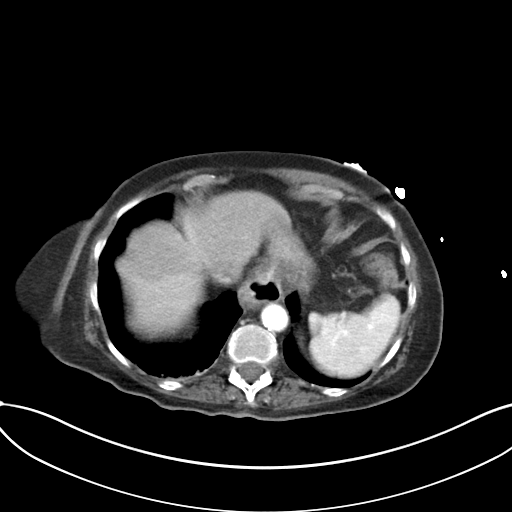
[im 84/89  soft-tissue]
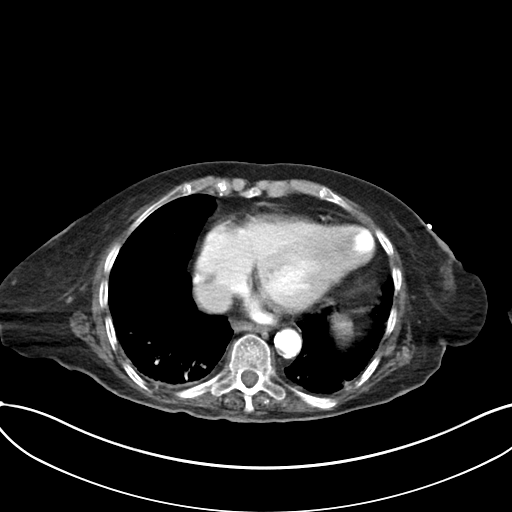

[Series 4: coronal soft tissue · coronal · 0.72mm/px · 3 of 80 slices shown]
[im 27/80  soft-tissue]
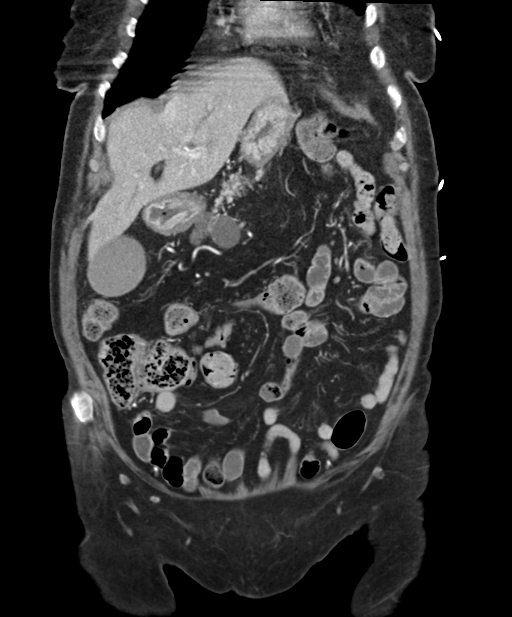
[im 36/80  soft-tissue]
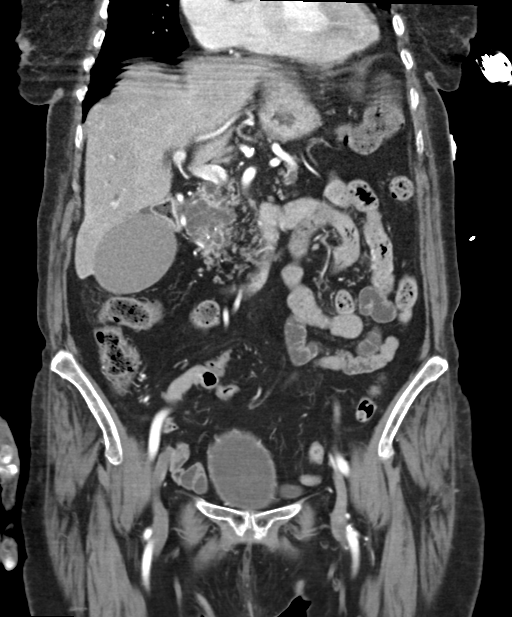
[im 44/80  soft-tissue]
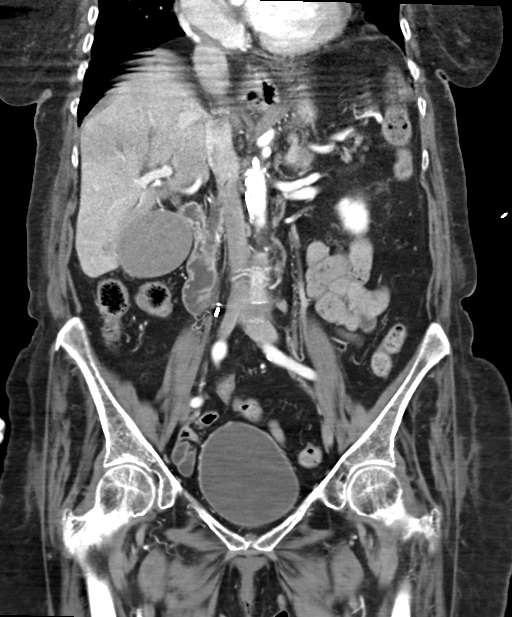

[16 of 46 positions shown; findings below may reference images not displayed]

FINDINGS: Lower chest: No acute abnormality.

Hepatobiliary: Distended gallbladder is noted with cholelithiasis.
No focal parenchymal abnormality is noted in the liver. Mild
intrahepatic and extrahepatic biliary dilatation is noted which was
present on prior exam.

Pancreas: 5.4 x 4.5 cm multi-cystic mass arises from the pancreatic
head.

Spleen: Normal in size without focal abnormality.

Adrenals/Urinary Tract: Status post right nephrectomy. Adrenal
glands appear normal. Stable left renal cyst is noted. No
hydronephrosis or renal obstruction is noted. Urinary bladder
appears normal.

Stomach/Bowel: There is no evidence of bowel obstruction.

Vascular/Lymphatic: Aortic atherosclerosis. No enlarged abdominal or
pelvic lymph nodes.

Reproductive: Status post hysterectomy. No adnexal masses.

Other: No abdominal wall hernia or abnormality. No abdominopelvic
ascites.

Musculoskeletal: No acute or significant osseous findings.
IMPRESSION: Distended gallbladder were cholelithiasis, but no evidence of
cholecystitis.

Mild intrahepatic and extrahepatic biliary dilatation is noted which
was present on prior exam. Correlation with liver function tests is
recommended to rule out biliary obstruction.

Status post right nephrectomy.

Aortic atherosclerosis.

5.4 x 4.5 cm multi-cystic mass seen in pancreatic head. This is
concerning for either serous cystadenoma or possibly mucinous cystic
neoplasm. Further evaluation with endoscopic ultrasound and cyst
aspiration should be considered, if not already performed.

## 2017-09-09 IMAGING — DX DG CHEST 2V
2 series · 2 of 2 positions shown · non-contrast
Comparison: 07/09/2016

CLINICAL DATA: Low grade temperature, possible infection

EXAM:
CHEST  2 VIEW

[x chest ap]
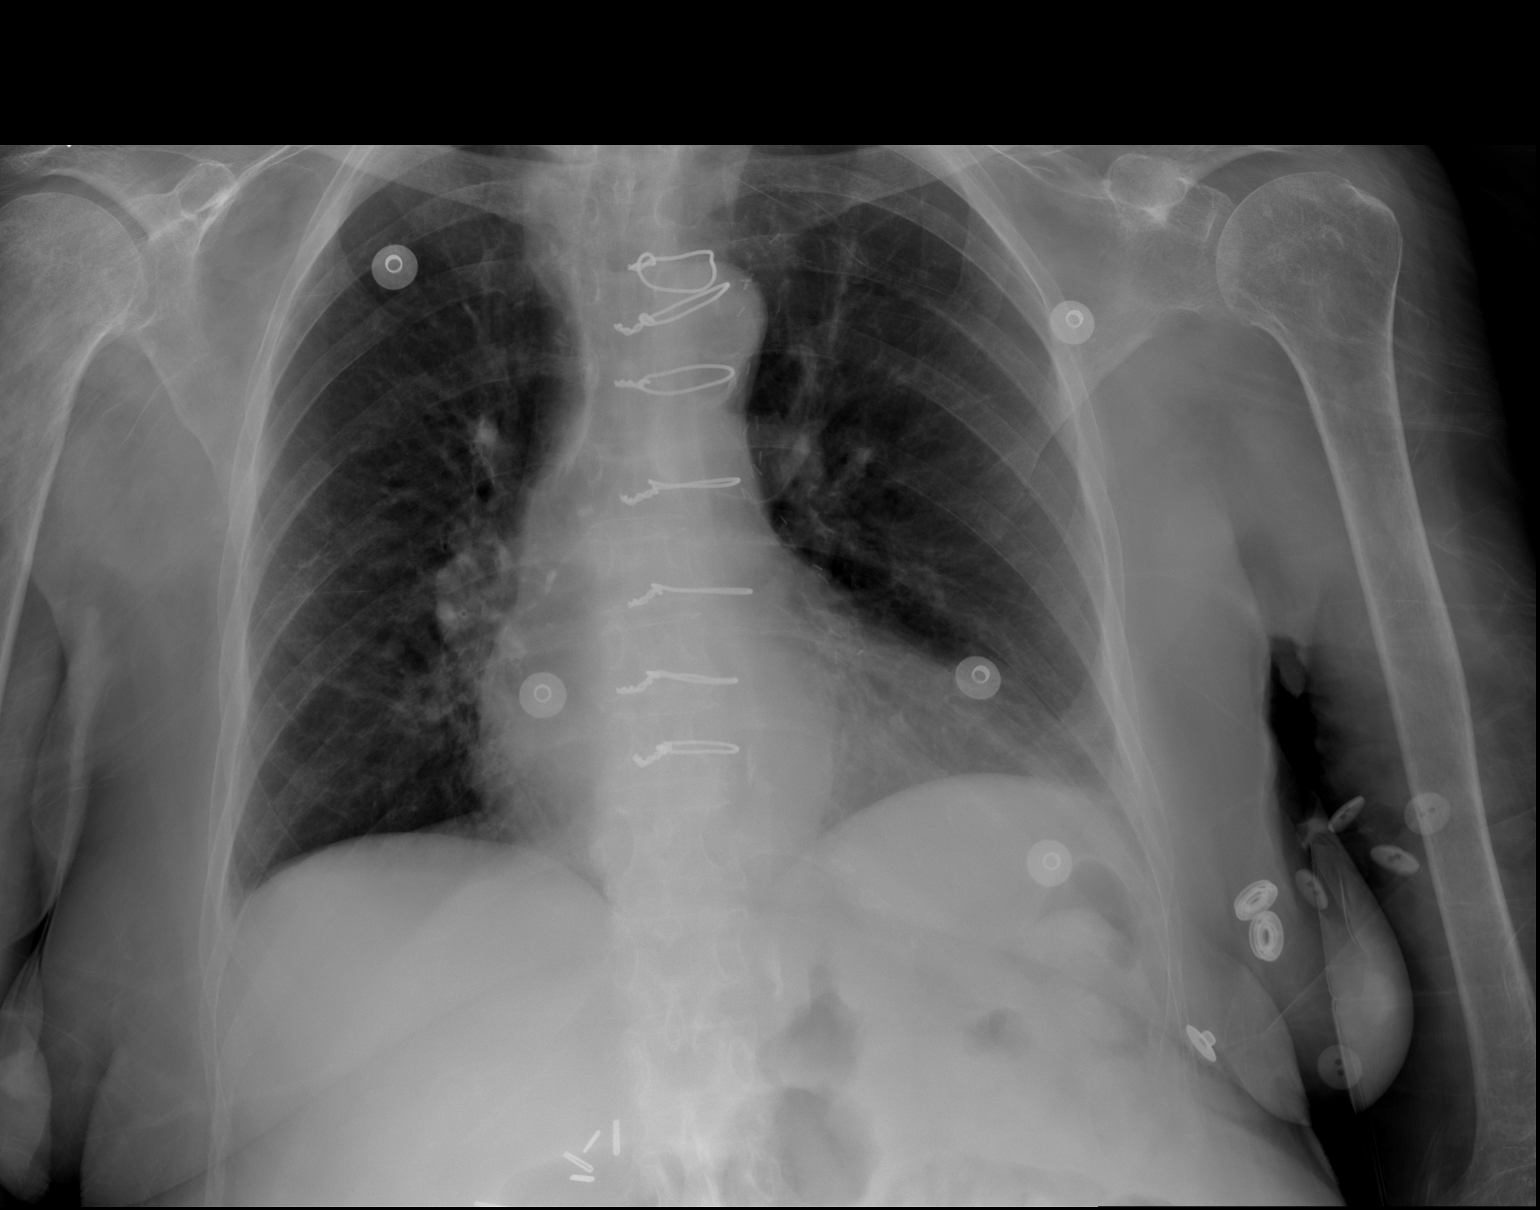

[w chest lat]
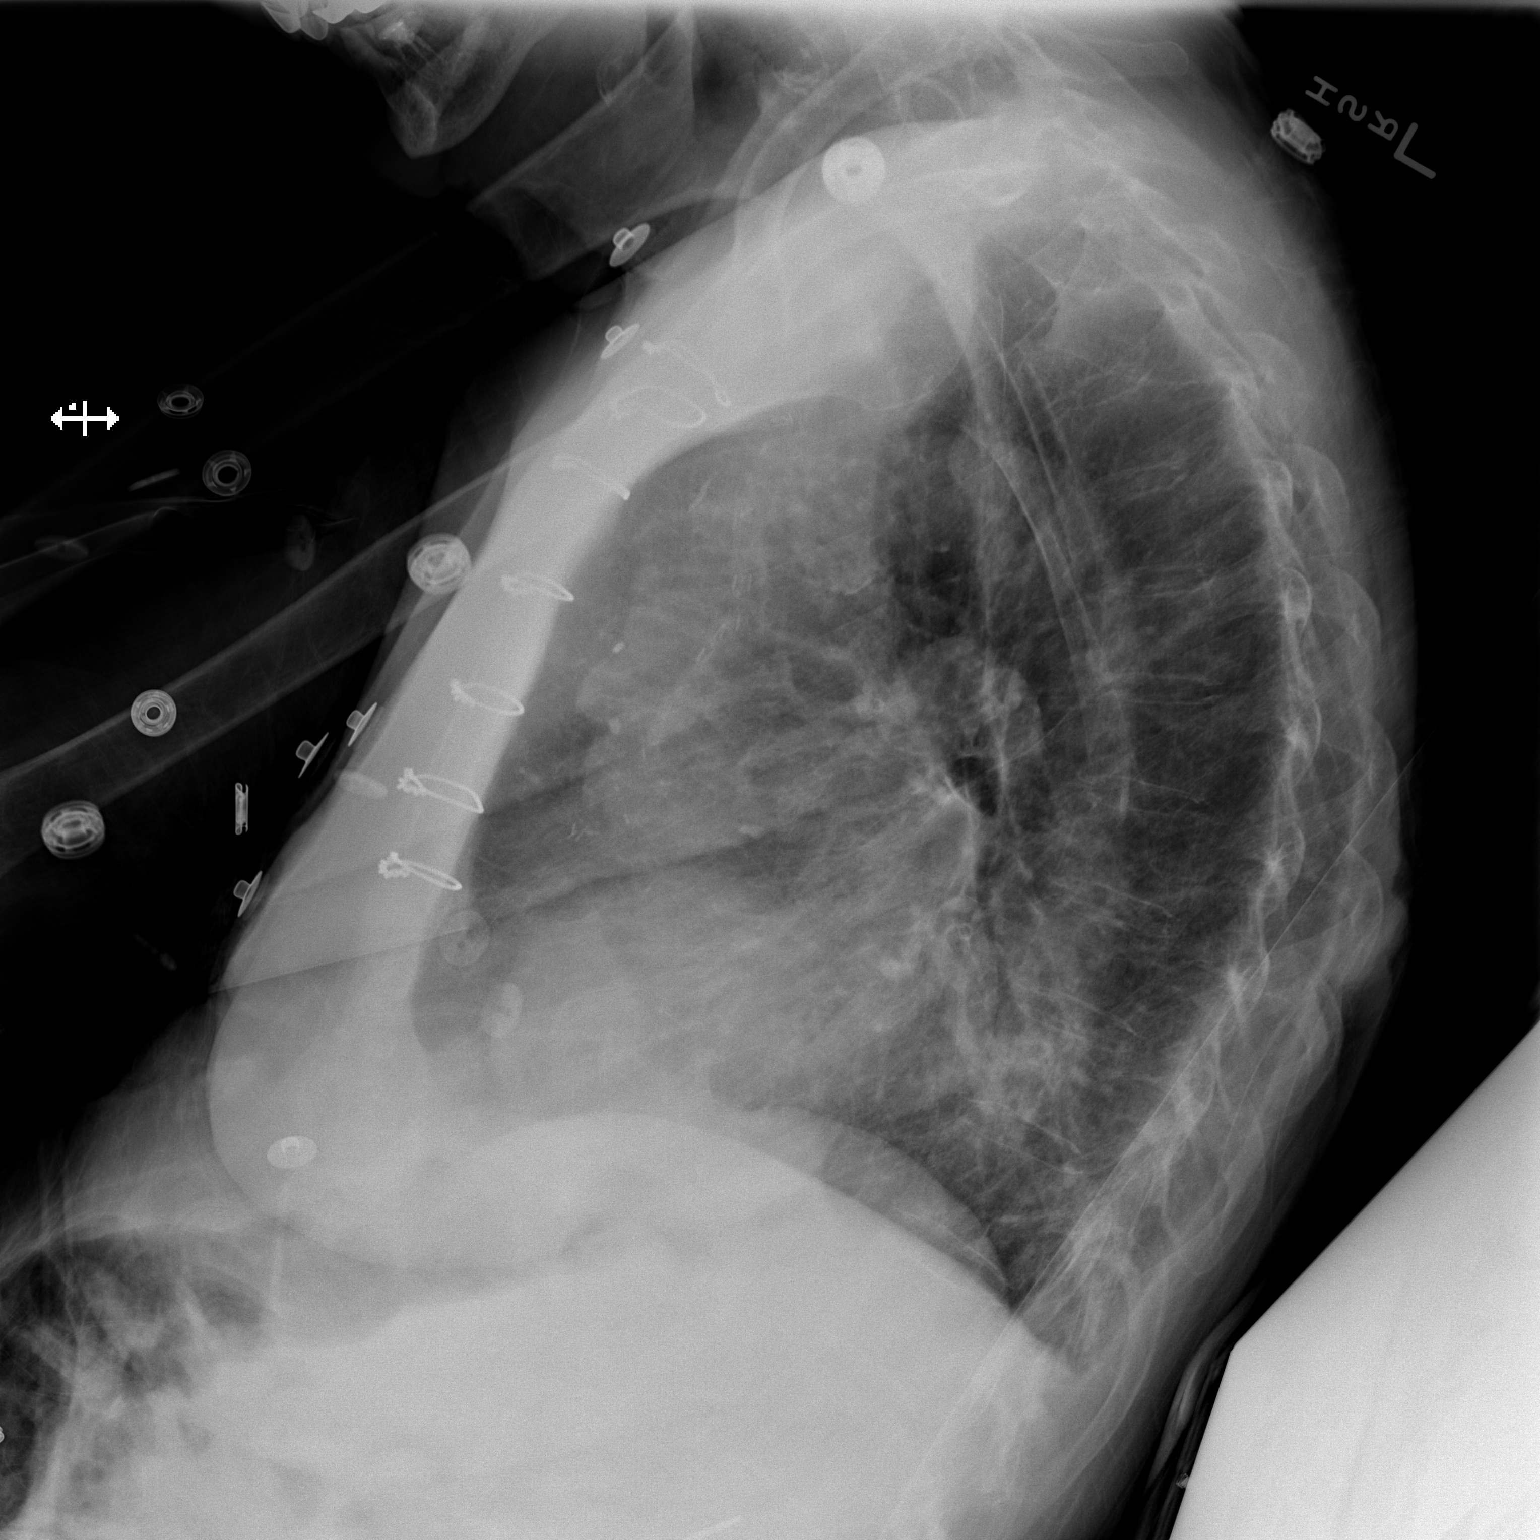

[2 of 2 positions shown; findings below may reference images not displayed]

FINDINGS: Cardiomediastinal silhouette is stable. There is streaky left base
retrocardiac atelectasis or early infiltrate. No pulmonary edema.
Osteopenia and degenerative changes thoracic spine.
IMPRESSION: Streaky left base retrocardiac atelectasis or early infiltrate. No
pulmonary edema.

## 2017-09-09 IMAGING — DX DG WRIST 2V*L*
2 series · 2 of 2 positions shown · non-contrast
Comparison: None.

CLINICAL DATA: Fell while at home today.  LEFT wrist pain.

EXAM:
LEFT WRIST - 2 VIEW

[wrist pa]
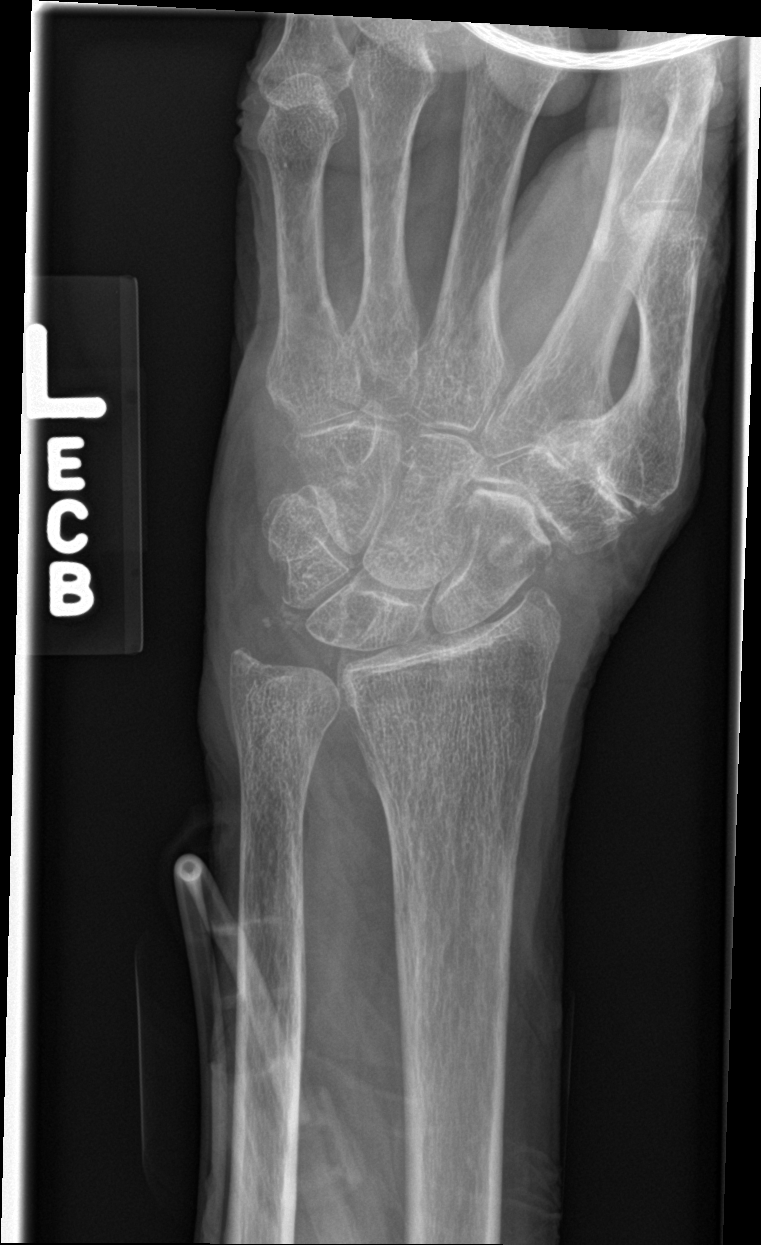

[wrist lat]
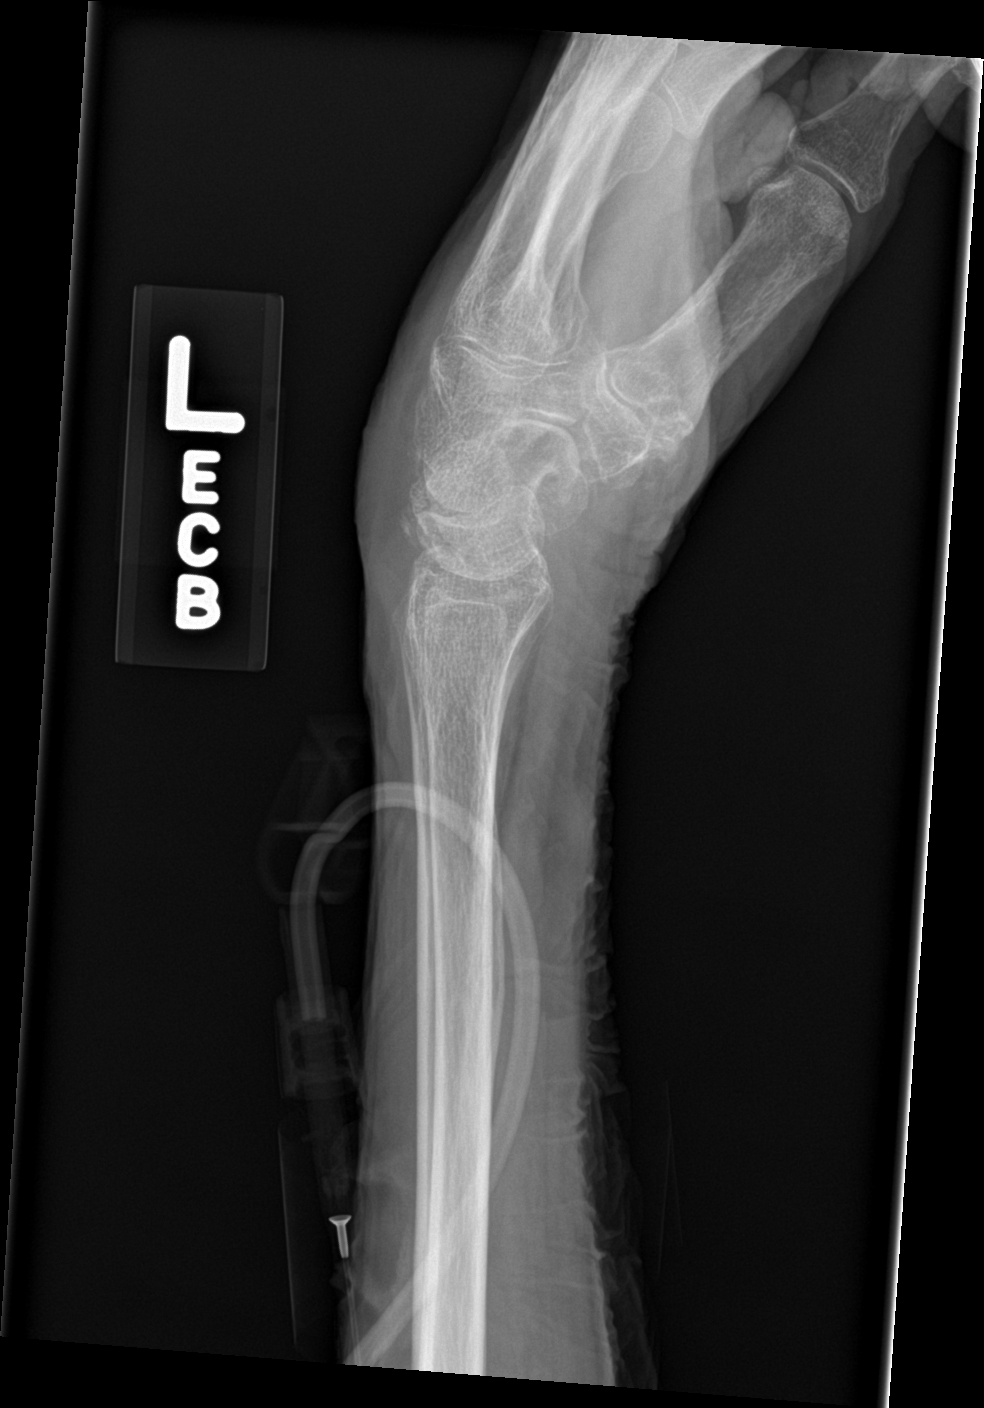

[2 of 2 positions shown; findings below may reference images not displayed]

FINDINGS: No acute fracture deformity or dislocation. Osteopenia without
destructive bony lesions. Moderate to severe first carpometacarpal
joint space narrowing, periarticular sclerosis and marginal spurring
compatible with osteoarthrosis. Faint intra-articular calcifications
compatible with CPPD. Dorsal wrist soft tissue swelling without
subcutaneous gas or radiopaque foreign bodies.
IMPRESSION: Soft tissue swelling without acute fracture deformity or
dislocation.

CPPD.

Moderate to severe first carpometacarpal osteoarthrosis.

## 2017-09-20 IMAGING — CR DG CHEST 1V PORT
1 series · 1 of 1 positions shown · non-contrast
Comparison: Single-view of the chest 10/08/2016. CT chest
10/11/2016.

CLINICAL DATA: Severe chest pain for 1-2 hours every day.

EXAM:
PORTABLE CHEST 1 VIEW

[AP]
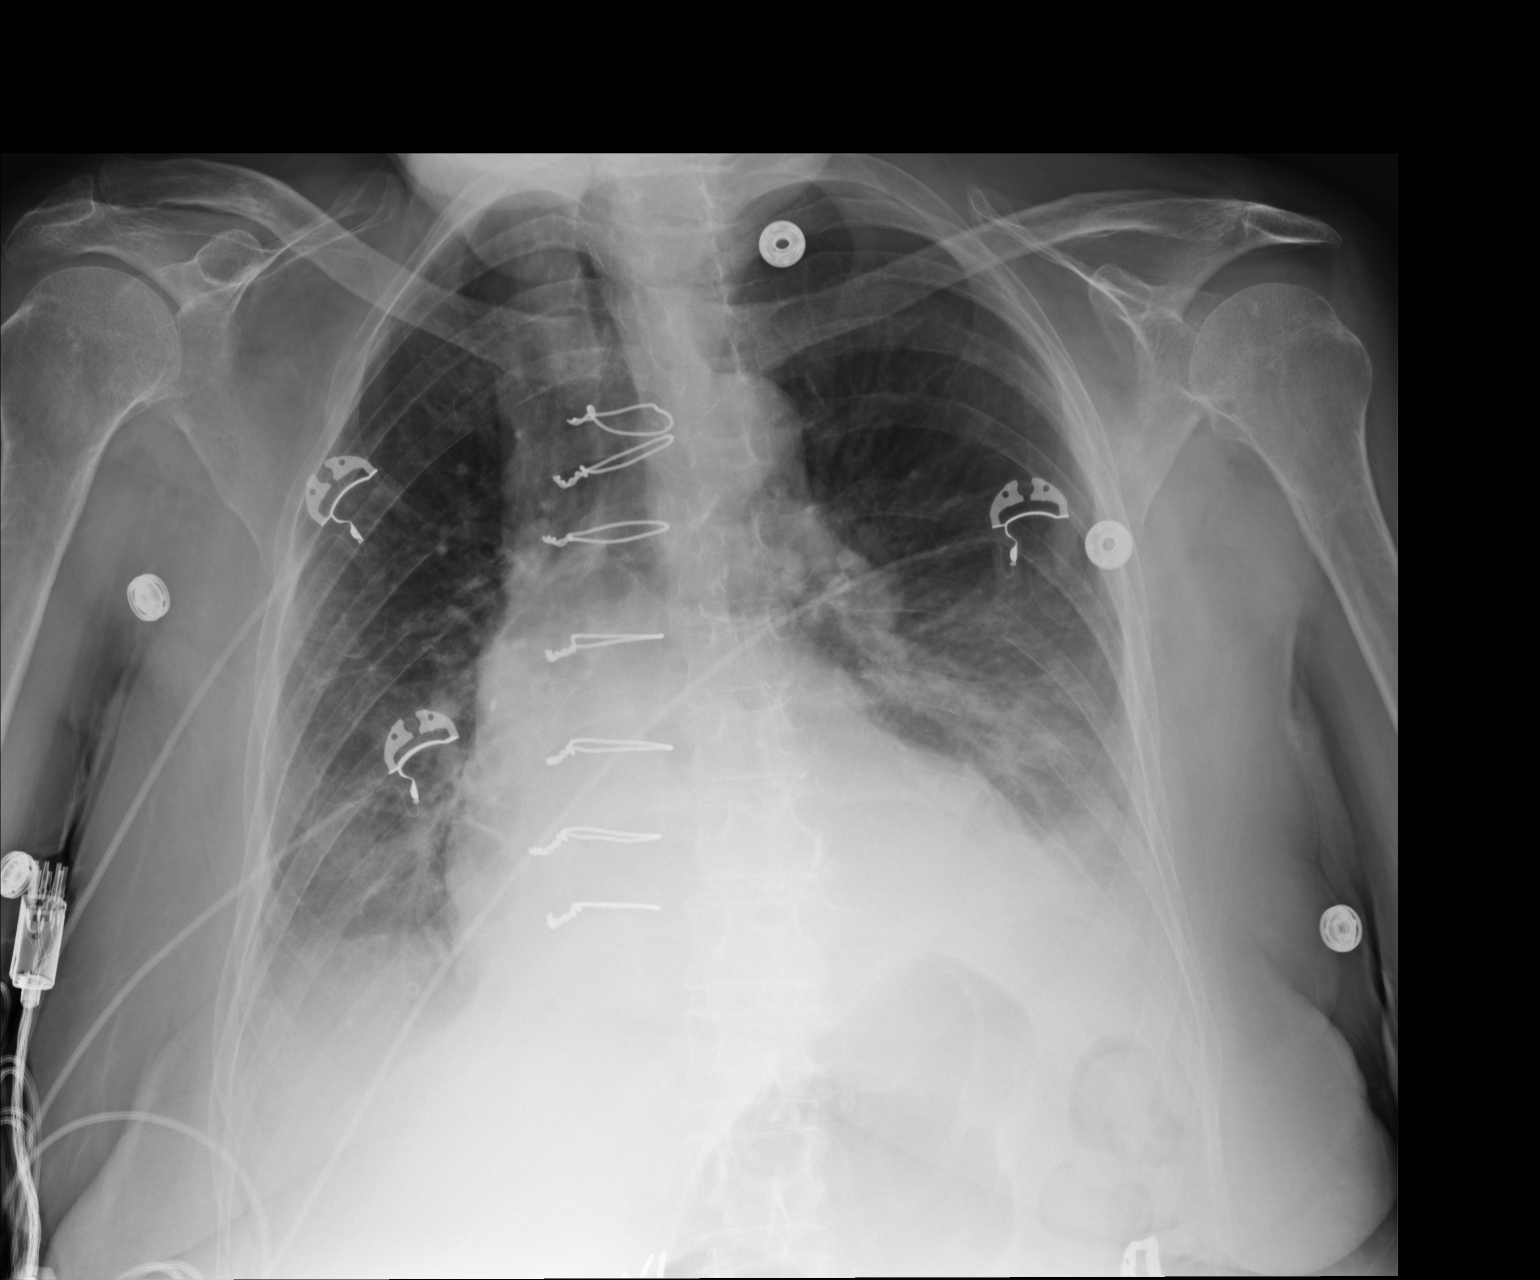

[1 of 1 positions shown; findings below may reference images not displayed]

FINDINGS: There is cardiomegaly. No pulmonary edema. Moderate bilateral
pleural effusions and basilar atelectasis are noted. No
pneumothorax.
IMPRESSION: Moderate bilateral pleural effusions and basilar atelectasis.

Cardiomegaly without edema.
